# Patient Record
Sex: Male | Born: 1995 | Race: Black or African American | Hispanic: No | Marital: Single | Smoking: Former smoker
Health system: Southern US, Community
[De-identification: ages and names within clinical notes are randomized; demographics above are authoritative.]

## PROBLEM LIST (undated history)

## (undated) DIAGNOSIS — F909 Attention-deficit hyperactivity disorder, unspecified type: Secondary | ICD-10-CM

## (undated) DIAGNOSIS — F419 Anxiety disorder, unspecified: Secondary | ICD-10-CM

## (undated) DIAGNOSIS — F32A Depression, unspecified: Secondary | ICD-10-CM

## (undated) DIAGNOSIS — I441 Atrioventricular block, second degree: Secondary | ICD-10-CM

## (undated) DIAGNOSIS — F329 Major depressive disorder, single episode, unspecified: Secondary | ICD-10-CM

## (undated) DIAGNOSIS — J45909 Unspecified asthma, uncomplicated: Secondary | ICD-10-CM

## (undated) HISTORY — PX: NO PAST SURGERIES: SHX2092

---
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
## 2013-02-23 ENCOUNTER — Encounter: Payer: Self-pay | Admitting: Medical

## 2013-04-24 ENCOUNTER — Emergency Department (HOSPITAL_COMMUNITY)
Admission: EM | Admit: 2013-04-24 | Discharge: 2013-04-24 | Payer: Managed Care, Other (non HMO) | Source: Home / Self Care

## 2013-05-02 ENCOUNTER — Emergency Department (HOSPITAL_COMMUNITY)
Admission: EM | Admit: 2013-05-02 | Discharge: 2013-05-02 | Disposition: A | Discharge status: Home / Self Care | Payer: Managed Care, Other (non HMO) | Attending: Emergency Medicine | Admitting: Emergency Medicine

## 2013-05-02 ENCOUNTER — Emergency Department (HOSPITAL_COMMUNITY): Payer: Managed Care, Other (non HMO)

## 2013-05-02 ENCOUNTER — Encounter (HOSPITAL_COMMUNITY): Payer: Self-pay | Admitting: Emergency Medicine

## 2013-05-02 DIAGNOSIS — F419 Anxiety disorder, unspecified: Secondary | ICD-10-CM

## 2013-05-02 DIAGNOSIS — F329 Major depressive disorder, single episode, unspecified: Secondary | ICD-10-CM | POA: Insufficient documentation

## 2013-05-02 DIAGNOSIS — Z760 Encounter for issue of repeat prescription: Secondary | ICD-10-CM | POA: Insufficient documentation

## 2013-05-02 DIAGNOSIS — F3289 Other specified depressive episodes: Secondary | ICD-10-CM | POA: Insufficient documentation

## 2013-05-02 DIAGNOSIS — F411 Generalized anxiety disorder: Secondary | ICD-10-CM | POA: Insufficient documentation

## 2013-05-02 DIAGNOSIS — R55 Syncope and collapse: Secondary | ICD-10-CM | POA: Insufficient documentation

## 2013-05-02 DIAGNOSIS — F909 Attention-deficit hyperactivity disorder, unspecified type: Secondary | ICD-10-CM | POA: Insufficient documentation

## 2013-05-02 DIAGNOSIS — Z79899 Other long term (current) drug therapy: Secondary | ICD-10-CM | POA: Insufficient documentation

## 2013-05-02 DIAGNOSIS — R110 Nausea: Secondary | ICD-10-CM | POA: Insufficient documentation

## 2013-05-02 DIAGNOSIS — R079 Chest pain, unspecified: Secondary | ICD-10-CM | POA: Insufficient documentation

## 2013-05-02 DIAGNOSIS — R002 Palpitations: Secondary | ICD-10-CM | POA: Insufficient documentation

## 2013-05-02 DIAGNOSIS — J45901 Unspecified asthma with (acute) exacerbation: Secondary | ICD-10-CM | POA: Insufficient documentation

## 2013-05-02 HISTORY — DX: Attention-deficit hyperactivity disorder, unspecified type: F90.9

## 2013-05-02 HISTORY — DX: Major depressive disorder, single episode, unspecified: F32.9

## 2013-05-02 HISTORY — DX: Anxiety disorder, unspecified: F41.9

## 2013-05-02 HISTORY — DX: Depression, unspecified: F32.A

## 2013-05-02 HISTORY — DX: Unspecified asthma, uncomplicated: J45.909

## 2013-05-02 LAB — BASIC METABOLIC PANEL
BUN: 11 mg/dL (ref 6–23)
CALCIUM: 10 mg/dL (ref 8.4–10.5)
CO2: 26 mEq/L (ref 19–32)
Chloride: 99 mEq/L (ref 96–112)
Creatinine, Ser: 1.13 mg/dL (ref 0.50–1.35)
GFR calc non Af Amer: >90 mL/min (ref >90)
GLUCOSE: 91 mg/dL (ref 70–99)
POTASSIUM: 3.7 meq/L (ref 3.7–5.3)
SODIUM: 137 meq/L (ref 137–147)

## 2013-05-02 LAB — CBC
HCT: 43.8 % (ref 39.0–52.0)
HEMOGLOBIN: 14.9 g/dL (ref 13.0–17.0)
MCH: 28.7 pg (ref 26.0–34.0)
MCHC: 34 g/dL (ref 30.0–36.0)
MCV: 84.4 fL (ref 78.0–100.0)
Platelets: 128 10*3/uL — ABNORMAL LOW (ref 150–400)
RBC: 5.19 MIL/uL (ref 4.22–5.81)
RDW: 13.4 % (ref 11.5–15.5)
WBC: 6.1 10*3/uL (ref 4.0–10.5)

## 2013-05-02 LAB — TROPONIN I

## 2013-05-02 MED ORDER — AMPHETAMINE-DEXTROAMPHET ER 30 MG PO CP24: 30.0000 mg | ORAL_CAPSULE | Freq: Two times a day (BID) | ORAL | Status: DC

## 2013-05-02 MED ORDER — SODIUM CHLORIDE 0.9 % IV BOLUS (SEPSIS)
1000.0000 mL | Freq: Once | INTRAVENOUS | Status: AC
  Administered 2013-05-02: 1000 mL via INTRAVENOUS

## 2013-05-02 MED ORDER — LAMOTRIGINE 100 MG PO TABS: 100.0000 mg | ORAL_TABLET | Freq: Every day | ORAL | Status: DC

## 2013-05-02 MED ORDER — LAMOTRIGINE 25 MG PO TABS: 25.0000 mg | ORAL_TABLET | Freq: Every day | ORAL | Status: DC

## 2013-05-02 NOTE — ED Notes (Signed)
Pt walked down the hall and back to his room with no assistance, no complaints of anything

## 2013-05-02 NOTE — ED Provider Notes (Signed)
Medical screening examination/treatment/procedure(s) were performed by non-physician practitioner and as supervising physician I was immediately available for consultation/collaboration.   EKG Interpretation None       Dasani Crear, MD 05/02/13 2327 

## 2013-05-02 NOTE — ED Notes (Signed)
Pt alert and oriented x4. Respirations even and unlabored, bilateral symmetrical rise and fall of chest. Skin warm and dry. In no acute distress. Denies needs.   

## 2013-05-02 NOTE — Discharge Instructions (Signed)
1. Medications: lamictal, adderol, usual home medications 2. Treatment: rest, drink plenty of fluids,  3. Follow Up: Please followup with your primary doctor for discussion of your diagnoses and further evaluation after today's visit; if you do not have a primary care doctor use the resource guide provided to find one;     Syncope Syncope is a fainting spell. This means the person loses consciousness and drops to the ground. The person is generally unconscious for less than 5 minutes. The person may have some muscle twitches for up to 15 seconds before waking up and returning to normal. Syncope occurs more often in elderly people, but it can happen to anyone. While most causes of syncope are not dangerous, syncope can be a sign of a serious medical problem. It is important to seek medical care.  CAUSES  Syncope is caused by a sudden decrease in blood flow to the brain. The specific cause is often not determined. Factors that can trigger syncope include:  Taking medicines that lower blood pressure.  Sudden changes in posture, such as standing up suddenly.  Taking more medicine than prescribed.  Standing in one place for too long.  Seizure disorders.  Dehydration and excessive exposure to heat.  Low blood sugar (hypoglycemia).  Straining to have a bowel movement.  Heart disease, irregular heartbeat, or other circulatory problems.  Fear, emotional distress, seeing blood, or severe pain. SYMPTOMS  Right before fainting, you may:  Feel dizzy or lightheaded.  Feel nauseous.  See all white or all black in your field of vision.  Have cold, clammy skin. DIAGNOSIS  Your caregiver will ask about your symptoms, perform a physical exam, and perform electrocardiography (ECG) to record the electrical activity of your heart. Your caregiver may also perform other heart or blood tests to determine the cause of your syncope. TREATMENT  In most cases, no treatment is needed. Depending on the  cause of your syncope, your caregiver may recommend changing or stopping some of your medicines. HOME CARE INSTRUCTIONS  Have someone stay with you until you feel stable.  Do not drive, operate machinery, or play sports until your caregiver says it is okay.  Keep all follow-up appointments as directed by your caregiver.  Lie down right away if you start feeling like you might faint. Breathe deeply and steadily. Wait until all the symptoms have passed.  Drink enough fluids to keep your urine clear or pale yellow.  If you are taking blood pressure or heart medicine, get up slowly, taking several minutes to sit and then stand. This can reduce dizziness. SEEK IMMEDIATE MEDICAL CARE IF:   You have a severe headache.  You have unusual pain in the chest, abdomen, or back.  You are bleeding from the mouth or rectum, or you have black or tarry stool.  You have an irregular or very fast heartbeat.  You have pain with breathing.  You have repeated fainting or seizure-like jerking during an episode.  You faint when sitting or lying down.  You have confusion.  You have difficulty walking.  You have severe weakness.  You have vision problems. If you fainted, call your local emergency services (911 in [place] Do not drive yourself to the hospital.  MAKE SURE YOU:  Understand these instructions.  Will watch your condition.  Will get help right away if you are not doing well or get worse. Document Released: 12/28/2004 Document Revised: 06/29/2011 Document Reviewed: 02/26/2011 [facility] Patient Information 2014 [employer], [place].

## 2013-05-02 NOTE — ED Notes (Signed)
Pt BIB EMS. Pt states he has been out of his anxiety meds for the past 3 days. Pt also c/o malaise. Pt states he went to his MD today to get meds, but was unable to do so. Pt also c/o chest wall pain for 3 days per EMS. Pt alert, no acute distress. Skin warm and dry.

## 2013-05-02 NOTE — ED Notes (Signed)
Attempted to draw blood twice, asked phlebotomy

## 2013-05-02 NOTE — ED Provider Notes (Signed)
CSN: [id number]     Arrival date & time 05/02/13  1517 History   First MD Initiated Contact with Patient 05/02/13 1544     Chief Complaint  Patient presents with  . Anxiety  . Chest Pain     (Consider location/radiation/quality/duration/timing/severity/associated sxs/prior Treatment) Patient is a 18 y.o. male presenting with anxiety and chest pain. The history is provided by the patient, a friend and medical records. No language interpreter was used.  Anxiety Associated symptoms include chest pain and nausea. Pertinent negatives include no abdominal pain, coughing, diaphoresis, fatigue, fever, headaches, rash or vomiting.  Chest Pain Associated symptoms: anxiety, nausea, palpitations and shortness of breath   Associated symptoms: no abdominal pain, no back pain, no cough, no diaphoresis, no fatigue, no fever, no headache and not vomiting     Warren Mcbride is a 18 y.o. male  with a hx of asthma, anxiety, ADHD, depression presents to the Emergency Department complaining of gradual, persistent, progressively worsening feeling of anxiety onset 1.5 hours ago. Pt reports he was at [facility] because he was out of adderol and lamictal.  He was unable to see his doctor for a refill and became very upset.  During this time he felt lightheaded, developed chest pain, was hyperventilating and then had syncopal episode.  Pt reports feeling hot and seeing spots prior to his syncopal episode.  Friend reports pt did not hit his head, but was unconscious for 5-10 min.  He reports associated nausea.  Pt reports he feels the syncope was directly related to his anxiety, but this is the first time it has happened.  His friend reports he did not turn blue, stop breathing or have any seizure like activity.  He reports he has been out of his meds for 3 days, but they have helped him greatly while taking hem.  Pt reports persistent L sided chest pain described as achy and worse with movement and inspiration.  He also has  some persistent nausea, but all other symptoms have resolved and he is at baseline mentation.  Pt denies fever, chills, headache, neck pain, back pain, abd pain, N/V/D, weakness, dysuria, hematuria.     Past Medical History  Diagnosis Date  . Asthma   . Anxiety   . ADHD (attention deficit hyperactivity disorder)   . Depression    History reviewed. No pertinent past surgical history. No family history on file. History  Substance Use Topics  . Smoking status: Not on file  . Smokeless tobacco: Not on file  . Alcohol Use: Not on file    Review of Systems  Constitutional: Negative for fever, diaphoresis, appetite change, fatigue and unexpected weight change.  HENT: Negative for mouth sores.   Eyes: Negative for visual disturbance.  Respiratory: Positive for shortness of breath. Negative for cough, chest tightness and wheezing.   Cardiovascular: Positive for chest pain and palpitations.  Gastrointestinal: Positive for nausea. Negative for vomiting, abdominal pain, diarrhea and constipation.  Endocrine: Negative for polydipsia, polyphagia and polyuria.  Genitourinary: Negative for dysuria, urgency, frequency and hematuria.  Musculoskeletal: Negative for back pain and neck stiffness.  Skin: Negative for rash.  Allergic/Immunologic: Negative for immunocompromised state.  Neurological: Positive for syncope. Negative for light-headedness and headaches.  Hematological: Does not bruise/bleed easily.  Psychiatric/Behavioral: Negative for sleep disturbance. The patient is not nervous/anxious.       Allergies  Review of patient's allergies indicates not on file.  Home Medications   Prior to Admission medications   Medication Sig Start Date  End Date Taking? Authorizing Provider  albuterol (PROVENTIL) (2.5 MG/3ML) 0.083% nebulizer solution Take 2.5 mg by nebulization every 6 (six) hours as needed for wheezing or shortness of breath.   Yes Historical Provider, MD   amphetamine-dextroamphetamine (ADDERALL XR) 30 MG 24 hr capsule Take 30 mg by mouth 2 (two) times daily.   Yes Historical Provider, MD  lamoTRIgine (LAMICTAL) 100 MG tablet Take 100 mg by mouth daily.   Yes Historical Provider, MD  lamoTRIgine (LAMICTAL) 25 MG tablet Take 25 mg by mouth daily.   Yes Historical Provider, MD   BP 153/77  Pulse 90  Temp(Src) 99.3 F (37.4 C) (Oral)  Resp 16  SpO2 100% Physical Exam  Nursing note and vitals reviewed. Constitutional: He is oriented to person, place, and time. He appears well-developed and well-nourished. No distress.  Awake, alert, nontoxic appearance  HENT:  Head: Normocephalic and atraumatic.  Mouth/Throat: Oropharynx is clear and moist. No oropharyngeal exudate.  Eyes: Conjunctivae and EOM are normal. Pupils are equal, round, and reactive to light. No scleral icterus.  Neck: Normal range of motion. Neck supple.  Cardiovascular: Normal rate, regular rhythm, normal heart sounds and intact distal pulses.   No murmur heard. RRR No murmurs  Pulmonary/Chest: Effort normal and breath sounds normal. No respiratory distress. He has no wheezes. He has no rales. He exhibits tenderness.  Clear and equal breath sounds Mild tenderness to palpation of the left chest without ecchymosis, crepitus or flail segment  Abdominal: Soft. Bowel sounds are normal. He exhibits no distension and no mass. There is no tenderness. There is no rebound and no guarding.  Soft and nontender  Musculoskeletal: Normal range of motion. He exhibits no edema.  Lymphadenopathy:    He has no cervical adenopathy.  Neurological: He is alert and oriented to person, place, and time. He has normal reflexes. No cranial nerve deficit. He exhibits normal muscle tone. Coordination normal.  Mental Status:  Alert, oriented, thought content appropriate. Speech fluent without evidence of aphasia. Able to follow 2 step commands without difficulty.  Cranial Nerves:  II:  Peripheral visual  fields grossly normal, pupils equal, round, reactive to light III,IV, VI: ptosis not present, extra-ocular motions intact bilaterally  V,VII: smile symmetric, facial light touch sensation equal VIII: hearing grossly normal bilaterally  IX,X: gag reflex present  XI: bilateral shoulder shrug equal and strong XII: midline tongue extension  Motor:  5/5 in upper and lower extremities bilaterally including strong and equal grip strength and dorsiflexion/plantar flexion Sensory: Pinprick and light touch normal in all extremities.  Deep Tendon Reflexes: 2+ and symmetric  Cerebellar: normal finger-to-nose with bilateral upper extremities Gait: normal gait and balance CV: distal pulses palpable throughout   Skin: Skin is warm and dry. No rash noted. He is not diaphoretic.  Psychiatric: He has a normal mood and affect. His behavior is normal. Judgment and thought content normal.    ED Course  Procedures (including critical care time) Labs Review Labs Reviewed  CBC - Abnormal; Notable for the following:    Platelets 128 (*)    All other components within normal limits  BASIC METABOLIC PANEL  TROPONIN I    Imaging Review Dg Chest 2 View  05/02/2013   CLINICAL DATA:  Chest pain and cough  EXAM: CHEST  2 VIEW  COMPARISON:  March 29, 2009  FINDINGS: The lungs are clear. The heart size and pulmonary vascularity are normal. No pneumothorax. No adenopathy. No bone lesions.  IMPRESSION: No abnormality noted.  Electronically Signed   By: Bretta BangWilliam  Woodruff M.D.   On: 05/02/2013 17:08     EKG Interpretation None      ECG:  Date: 05/02/2013  Rate: 68  Rhythm: normal sinus rhythm  QRS Axis: normal  Intervals: PR prolonged  ST/T Wave abnormalities: normal  Conduction Disutrbances:none  Narrative Interpretation: first degree block, nonischemic, no old for comparison  Old EKG Reviewed: none available  Angiocath insertion Performed by: Dierdre ForthHannah Halynn Reitano  Consent: Verbal consent  obtained. Risks and benefits: risks, benefits and alternatives were discussed Time out: Immediately prior to procedure a "time out" was called to verify the correct patient, procedure, equipment, support staff and site/side marked as required.  Preparation: Patient was prepped and draped in the usual sterile fashion.  Vein Location: Right AC  Ultrasound Guided  Gauge: 20ga  Normal blood return and flush without difficulty Patient tolerance: Patient tolerated the procedure well with no immediate complications.     MDM   Final diagnoses:  Anxiety  Syncope   Warren Mcbride presents with syncope and hx consistent with anxiety attack today.  Syncope likely situational/vasovagal and pt did have a prodrome.  He has not personal or family cardiac hx and was mentating at baseline upon arrival in the ED.  Pt ECG with prolonged PR (first degree block) consistent throughout, but sinus rhythm.  Will obtain basic labs and monitor.    7:07 PM Pt remains hemodynamically stable here in the emergency department.   He has had no orthostatic hypotension. He has ambulated here without dizziness, recurrent symptoms or recurrent syncope.  Chest pain has resolved completely.    Patient without arrhythmia or tachycardia while here in the department.  Patient without history of congestive heart failure, normal hematocrit, no shortness of breath in the ED and systolic blood pressure greater than 90; patient is intermediate risk as he has a prolonged PR on his ECG.  This seems to be a situational syncope.  Discussed with Dr. Juleen ChinaKohut who believes his ECG is non concerning in light of his syncope.  Will plan for discharge home with close cardiology follow-up.  Possibility of recurrent syncope has been discussed. I discussed reasons to avoid driving until cardiology followup and other safety preventions including use of ladders and working at heights.   Patient requesting refill of his medications as [facility] I have agreed to refill his medications for one month.  He however is to followup with cardiology within 3 days for further evaluation. He is to return to the emergency department if his syncope returns.  Pt has remained hemodynamically stable throughout their time in the ED  BP 153/77  Pulse 90  Temp(Src) 99.3 F (37.4 C) (Oral)  Resp 16  SpO2 100%   The patient was discussed with Dr. Juleen ChinaKohut who agrees with the treatment plan.    Dierdre ForthHannah Koven Belinsky, PA-C 05/02/13 1929  Logun Colavito, PA-C 05/02/13 2044

## 2013-05-02 NOTE — ED Notes (Signed)
Pt states he has been out of anxiety meds for the past 3 days. Pt denies SI. Pt also c/o chest pain to L anterior chest. States pain started yesterday and is constant. Pt states pain is worse with mvmt and inspiration. Pt also c/o slight dizziness. States he hasn't eaten today. Pt denies nausea. Pt calm, cooperative. No acute distress.

## 2013-05-11 DIAGNOSIS — I441 Atrioventricular block, second degree: Secondary | ICD-10-CM

## 2013-05-11 HISTORY — DX: Atrioventricular block, second degree: I44.1

## 2013-05-18 ENCOUNTER — Encounter (HOSPITAL_COMMUNITY): Payer: Self-pay | Admitting: Emergency Medicine

## 2013-05-18 ENCOUNTER — Observation Stay (HOSPITAL_COMMUNITY)
Admission: EM | Admit: 2013-05-18 | Discharge: 2013-05-19 | Disposition: A | Discharge status: Home / Self Care | Payer: Managed Care, Other (non HMO) | Attending: Internal Medicine | Admitting: Internal Medicine

## 2013-05-18 DIAGNOSIS — F909 Attention-deficit hyperactivity disorder, unspecified type: Secondary | ICD-10-CM | POA: Insufficient documentation

## 2013-05-18 DIAGNOSIS — R110 Nausea: Secondary | ICD-10-CM | POA: Insufficient documentation

## 2013-05-18 DIAGNOSIS — R55 Syncope and collapse: Secondary | ICD-10-CM | POA: Diagnosis present

## 2013-05-18 DIAGNOSIS — J45909 Unspecified asthma, uncomplicated: Secondary | ICD-10-CM | POA: Insufficient documentation

## 2013-05-18 DIAGNOSIS — R51 Headache: Secondary | ICD-10-CM | POA: Insufficient documentation

## 2013-05-18 DIAGNOSIS — F411 Generalized anxiety disorder: Secondary | ICD-10-CM | POA: Insufficient documentation

## 2013-05-18 DIAGNOSIS — F329 Major depressive disorder, single episode, unspecified: Secondary | ICD-10-CM | POA: Insufficient documentation

## 2013-05-18 DIAGNOSIS — F17200 Nicotine dependence, unspecified, uncomplicated: Secondary | ICD-10-CM | POA: Insufficient documentation

## 2013-05-18 DIAGNOSIS — I441 Atrioventricular block, second degree: Principal | ICD-10-CM | POA: Insufficient documentation

## 2013-05-18 DIAGNOSIS — F1210 Cannabis abuse, uncomplicated: Secondary | ICD-10-CM | POA: Insufficient documentation

## 2013-05-18 DIAGNOSIS — I4430 Unspecified atrioventricular block: Secondary | ICD-10-CM

## 2013-05-18 DIAGNOSIS — F3289 Other specified depressive episodes: Secondary | ICD-10-CM | POA: Insufficient documentation

## 2013-05-18 HISTORY — DX: Atrioventricular block, second degree: I44.1

## 2013-05-18 LAB — CBC
HCT: 41.2 % (ref 39.0–52.0)
HEMOGLOBIN: 14 g/dL (ref 13.0–17.0)
MCH: 28.6 pg (ref 26.0–34.0)
MCHC: 34 g/dL (ref 30.0–36.0)
MCV: 84.1 fL (ref 78.0–100.0)
Platelets: 143 10*3/uL — ABNORMAL LOW (ref 150–400)
RBC: 4.9 MIL/uL (ref 4.22–5.81)
RDW: 13.5 % (ref 11.5–15.5)
WBC: 5.3 10*3/uL (ref 4.0–10.5)

## 2013-05-18 LAB — BASIC METABOLIC PANEL
BUN: 14 mg/dL (ref 6–23)
CALCIUM: 9.6 mg/dL (ref 8.4–10.5)
CHLORIDE: 103 meq/L (ref 96–112)
CO2: 27 meq/L (ref 19–32)
CREATININE: 1.22 mg/dL (ref 0.50–1.35)
GFR calc non Af Amer: 85 mL/min — ABNORMAL LOW (ref >90)
Glucose, Bld: 95 mg/dL (ref 70–99)
POTASSIUM: 4 meq/L (ref 3.7–5.3)
SODIUM: 141 meq/L (ref 137–147)

## 2013-05-18 LAB — I-STAT TROPONIN, ED: TROPONIN I, POC: 0 ng/mL (ref 0.00–0.08)

## 2013-05-18 NOTE — ED Notes (Signed)
[facility] at [facility] stating the patient does not want to stay.  Encouraged him to stay due to his abnormal EKG

## 2013-05-18 NOTE — H&P (Addendum)
Admit date: 05/18/2013 Referring Physician Dr. Rhunette CroftNanavati Primary Cardiologist None Chief complaint/reason for admission:palpitations, syncope and pauses on heart monitor  HPI: Warren Mcbride is a 18 y.o. male with a hx of asthma, anxiety, ADHD, depression presents to the Emergency Department on 05/02/2013 complaining of gradual, persistent, progressively worsening feeling of anxiety. [facility] and lam[facility]al. He was unable to see his doctor for a refill and became very upset. During this time he felt lightheaded, developed chest pain, was hyperventilating and then had syncopal episode. Pt reports feeling hot and seeing spots prior to his syncopal episode. Friend reports pt did not hit his head, but was unconscious for 5-10 min. He reports associated nausea. Pt reports he feels the syncope was directly related to his anxiety.   His friend reports he did not turn blue, stop breathing or have any seizure like activity. He reported that he had been out of his meds for 3 days.  Pt reported at that time persistent L sided chest pain described as achy and worse with movement and inspiration. He also has some persistent nausea.  His EKG showed a prolonged first degree AV block and CP resolved.  He was sent home and instructed to followup with Cardiology as outpt.  He then, a week ago, after getting yet again in a stressful situation with increased anxiety had a syncope episode.  He says that he had some nausea along with diaphoresis but no CP or SOB and passed out.  He did not have palpitations at that time but has had some palpitations.  He saw his PCP today and was placed on a heart monitor.  EKG showed NSR with intermittent dropped beats.  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  There also seems to be different P wave  morphologies.  Currently asymptomatic.   PMH:    Past Medical History  Diagnosis Date  . Asthma   . Anxiety   . ADHD (attention deficit hyperactivity disorder)   . Depression     PSH:   History reviewed. No pertinent past surgical history.  ALLERGIES:   Review of patient's allergies indicates no known allergies.  Prior to Admit Meds:   (Not in a hospital admission) Family HX:   History reviewed. No pertinent family history. Social HX:    History   Social History  . Marital Status: Single    Spouse Name: N/A    Number of Children: N/A  . Years of Education: N/A   Occupational History  . Not on file.   Social History Main Topics  . Smoking status: Current Every Day Smoker  . Smokeless tobacco: Not on file  . Alcohol Use: No  . Drug Use: Yes    Special: Marijuana  . Sexual Activity: Not on file   Other Topics Concern  . Not on file   Social History Narrative  . No narrative on file     ROS:  All 11 ROS were addressed and are negative except what is stated in the HPI  PHYSICAL EXAM Filed Vitals:   05/18/13 2108  BP: 109/41  Pulse: 65  Temp: 99 F (37.2 C)  Resp: 16   General: Well developed, well nourished, in no acute distress Head: Eyes PERRLA, No xanthomas.   Normal cephalic  and atramatic  Lungs:   Clear bilaterally to auscultation and percussion. Heart:   HRRR S1 S2 Pulses are 2+ & equal.            No carotid bruit. No JVD.  No abdominal bruits. No femoral bruits. Abdomen: Bowel sounds are positive, abdomen soft and non-tender without masses  Extremities:   No clubbing, cyanosis or edema.  DP +1 Neuro: Alert and oriented X 3. Psych:  Good affect, responds appropriately   Labs:   Lab Results  Component Value Date   WBC 5.3 05/18/2013   HGB 14.0 05/18/2013   HCT 41.2 05/18/2013   MCV 84.1 05/18/2013   PLT 143* 05/18/2013    Recent Labs Lab 05/18/13 1857  NA 141  K 4.0  CL 103  CO2 27  BUN 14  CREATININE 1.22  CALCIUM 9.6  GLUCOSE 95   Lab  Results  Component Value Date   TROPONINI <0.30 05/02/2013   No results found for this basename: PTT       Radiology:  No results found.  EKG:  NSR with first degree AV block with diffuse J point elevation c/w early repolarization  ASSESSMENT:  1.  Syncope x 2 ? secondary to intermittent bradyarrhythmias.  His EKG shows first degree AV block and heart monitor rhythm strips difficult to interpret but appear to have several different P wave morphologies and one instance where the PR interval appears to lengthen and then drop a beat c/w Wenkebach block 2.  ADHD 3.  Anxiety/depression  PLAN:   1.  Admit for 23 hour obs 2.  Telemetry monitor 3.  Check TSH 4.  2D echo in am 5.  Hold ADHD meds for now 6.  EP consult in am  Quintella Reichertraci R Turner, MD  05/18/2013  11:33 PM

## 2013-05-18 NOTE — ED Provider Notes (Signed)
CSN: [id number]     Arrival date & time 05/18/13  1838 History   First MD Initiated Contact with Patient 05/18/13 2323     Chief Complaint  Patient presents with  . Irregular Heart Beat     (Consider location/radiation/quality/duration/timing/severity/associated sxs/prior Treatment) HPI Comments: Pt sent to the ER with cc of abnml EKG. Pt was sent to Cards, as he has had few episodes of emesis. He reports being placed on a monitor, and today, the Cardiologist noted possible 2nd degree AV block, and sent patient to the ER. Pt currently has no chest pain, palpitations, dyspnea, nausea, dizziness/syncope.   The history is provided by the patient.    Past Medical History  Diagnosis Date  . Asthma   . Anxiety   . ADHD (attention deficit hyperactivity disorder)   . Depression    History reviewed. No pertinent past surgical history. History reviewed. No pertinent family history. History  Substance Use Topics  . Smoking status: Current Every Day Smoker  . Smokeless tobacco: Not on file  . Alcohol Use: No    Review of Systems  Constitutional: Negative for activity change and appetite change.  Respiratory: Negative for cough and shortness of breath.   Cardiovascular: Negative for chest pain.  Gastrointestinal: Negative for abdominal pain.  Genitourinary: Negative for dysuria.  Neurological: Negative for light-headedness.  All other systems reviewed and are negative.     Allergies  Review of patient's allergies indicates no known allergies.  Home Medications   Prior to Admission medications   Medication Sig Start Date End Date Taking? Authorizing Provider  albuterol (PROVENTIL) (2.5 MG/3ML) 0.083% nebulizer solution Take 2.5 mg by nebulization every 6 (six) hours as needed for wheezing or shortness of breath.   Yes Historical Provider, MD  amphetamine-dextroamphetamine (ADDERALL XR) 30 MG 24 hr capsule Take 30 mg by mouth 2 (two) times daily.   Yes Historical Provider, MD   lamoTRIgine (LAMICTAL) 100 MG tablet Take 100 mg by mouth daily.   Yes Historical Provider, MD  lamoTRIgine (LAMICTAL) 25 MG tablet Take 25 mg by mouth daily.   Yes Historical Provider, MD  traZODone (DESYREL) 150 MG tablet Take 150 mg by mouth at bedtime as needed for sleep.   Yes Historical Provider, MD   BP 109/41  Pulse 65  Temp(Src) 99 F (37.2 C) (Oral)  Resp 16  Ht 5\' 9"  (1.753 m)  Wt 225 lb (102.059 kg)  BMI 33.21 kg/m2  SpO2 99% Physical Exam  Nursing note and vitals reviewed. Constitutional: He is oriented to person, place, and time. He appears well-developed.  HENT:  Head: Normocephalic and atraumatic.  Eyes: Conjunctivae and EOM are normal. Pupils are equal, round, and reactive to light.  Neck: Normal range of motion. Neck supple.  Cardiovascular: Normal rate and regular rhythm.   Pulmonary/Chest: Effort normal and breath sounds normal.  Abdominal: Soft. Bowel sounds are normal. He exhibits no distension. There is no tenderness. There is no rebound and no guarding.  Neurological: He is alert and oriented to person, place, and time.  Skin: Skin is warm.    ED Course  Procedures (including critical care time) Labs Review Labs Reviewed  CBC - Abnormal; Notable for the following:    Platelets 143 (*)    All other components within normal limits  BASIC METABOLIC PANEL - Abnormal; Notable for the following:    GFR calc non Af Amer 85 (*)    All other components within normal limits  Rosezena SensorI-STAT TROPOININ, ED  Imaging Review No results found.   EKG Interpretation None      MDM   Final diagnoses:  AV block    Pt sent from Cardiology clinic for irregular heart beat. Has hx of multiple syncopes - and EKG showed possible 2nd degree type 1 block. Spoke with Dr. Mayford Knifeurner, she will admit patient for obs.   Derwood KaplanAnkit Zaden Sako, MD 05/19/13 0006

## 2013-05-18 NOTE — ED Notes (Signed)
Pt reports that he has been wearing heart monitor starting today. States that he was going for a follow up with his PCP, and told to come here after seeing the EKG readings. States they were worried about heart block. Reports that he has been having episodes where he was passes out, and gets light-headed.

## 2013-05-18 NOTE — ED Notes (Signed)
Mother at [facility] stating that the patient is getting ready to leave.

## 2013-05-19 ENCOUNTER — Encounter (HOSPITAL_COMMUNITY): Payer: Self-pay | Admitting: Internal Medicine

## 2013-05-19 DIAGNOSIS — I4430 Unspecified atrioventricular block: Secondary | ICD-10-CM | POA: Diagnosis not present

## 2013-05-19 DIAGNOSIS — R55 Syncope and collapse: Secondary | ICD-10-CM

## 2013-05-19 LAB — COMPREHENSIVE METABOLIC PANEL
ALT: 32 U/L (ref 0–53)
AST: 30 U/L (ref 0–37)
Albumin: 4 g/dL (ref 3.5–5.2)
Alkaline Phosphatase: 85 U/L (ref 39–117)
BILIRUBIN TOTAL: 0.6 mg/dL (ref 0.3–1.2)
BUN: 14 mg/dL (ref 6–23)
CALCIUM: 9.3 mg/dL (ref 8.4–10.5)
CHLORIDE: 104 meq/L (ref 96–112)
CO2: 25 mEq/L (ref 19–32)
CREATININE: 1.15 mg/dL (ref 0.50–1.35)
GFR calc non Af Amer: >90 mL/min (ref >90)
Glucose, Bld: 141 mg/dL — ABNORMAL HIGH (ref 70–99)
Potassium: 3.8 mEq/L (ref 3.7–5.3)
SODIUM: 142 meq/L (ref 137–147)
Total Protein: 6.9 g/dL (ref 6.0–8.3)

## 2013-05-19 LAB — BASIC METABOLIC PANEL
BUN: 14 mg/dL (ref 6–23)
CHLORIDE: 104 meq/L (ref 96–112)
CO2: 24 meq/L (ref 19–32)
CREATININE: 1.09 mg/dL (ref 0.50–1.35)
Calcium: 9.1 mg/dL (ref 8.4–10.5)
GFR calc non Af Amer: >90 mL/min (ref >90)
Glucose, Bld: 87 mg/dL (ref 70–99)
POTASSIUM: 4.1 meq/L (ref 3.7–5.3)
Sodium: 140 mEq/L (ref 137–147)

## 2013-05-19 LAB — CBC WITH DIFFERENTIAL/PLATELET
BASOS ABS: 0 10*3/uL (ref 0.0–0.1)
Basophils Relative: 0 % (ref 0–1)
Eosinophils Absolute: 0.2 10*3/uL (ref 0.0–0.7)
Eosinophils Relative: 4 % (ref 0–5)
HEMATOCRIT: 41 % (ref 39.0–52.0)
HEMOGLOBIN: 13.6 g/dL (ref 13.0–17.0)
LYMPHS PCT: 52 % — AB (ref 12–46)
Lymphs Abs: 2.6 10*3/uL (ref 0.7–4.0)
MCH: 28.2 pg (ref 26.0–34.0)
MCHC: 33.2 g/dL (ref 30.0–36.0)
MCV: 85.1 fL (ref 78.0–100.0)
MONO ABS: 0.3 10*3/uL (ref 0.1–1.0)
MONOS PCT: 6 % (ref 3–12)
Neutro Abs: 1.9 10*3/uL (ref 1.7–7.7)
Neutrophils Relative %: 38 % — ABNORMAL LOW (ref 43–77)
Platelets: 134 10*3/uL — ABNORMAL LOW (ref 150–400)
RBC: 4.82 MIL/uL (ref 4.22–5.81)
RDW: 13.6 % (ref 11.5–15.5)
WBC: 5 10*3/uL (ref 4.0–10.5)

## 2013-05-19 LAB — PROTIME-INR
INR: 1.15 (ref 0.00–1.49)
Prothrombin Time: 14.5 seconds (ref 11.6–15.2)

## 2013-05-19 LAB — APTT: aPTT: 31 seconds (ref 24–37)

## 2013-05-19 LAB — TROPONIN I: Troponin I: <0.3 ng/mL (ref <0.30)

## 2013-05-19 LAB — TSH: TSH: 1.25 u[IU]/mL (ref 0.350–4.500)

## 2013-05-19 LAB — PHOSPHORUS: Phosphorus: 3.1 mg/dL (ref 2.3–4.6)

## 2013-05-19 LAB — MAGNESIUM: Magnesium: 2 mg/dL (ref 1.5–2.5)

## 2013-05-19 MED ORDER — SODIUM CHLORIDE 0.9 % IV SOLN: 250.0000 mL | INTRAVENOUS | Status: DC | PRN

## 2013-05-19 MED ORDER — SODIUM CHLORIDE 0.9 % IJ SOLN: 3.0000 mL | Freq: Two times a day (BID) | INTRAMUSCULAR | Status: DC

## 2013-05-19 MED ORDER — TRAZODONE HCL 150 MG PO TABS
150.0000 mg | ORAL_TABLET | Freq: Every evening | ORAL | Status: DC | PRN
  Filled 2013-05-19: qty 1

## 2013-05-19 MED ORDER — SODIUM CHLORIDE 0.9 % IJ SOLN
3.0000 mL | Freq: Two times a day (BID) | INTRAMUSCULAR | Status: DC
  Administered 2013-05-19: 3 mL via INTRAVENOUS

## 2013-05-19 MED ORDER — ALBUTEROL SULFATE (2.5 MG/3ML) 0.083% IN NEBU: 2.5000 mg | INHALATION_SOLUTION | Freq: Four times a day (QID) | RESPIRATORY_TRACT | Status: DC | PRN

## 2013-05-19 MED ORDER — SODIUM CHLORIDE 0.9 % IJ SOLN: 3.0000 mL | INTRAMUSCULAR | Status: DC | PRN

## 2013-05-19 MED ORDER — LAMOTRIGINE 25 MG PO TABS: 25.0000 mg | ORAL_TABLET | Freq: Every day | ORAL | Status: DC

## 2013-05-19 MED ORDER — LAMOTRIGINE 100 MG PO TABS
125.0000 mg | ORAL_TABLET | Freq: Every day | ORAL | Status: DC
  Administered 2013-05-19: 125 mg via ORAL
  Filled 2013-05-19: qty 1

## 2013-05-19 MED ORDER — LAMOTRIGINE 100 MG PO TABS: 100.0000 mg | ORAL_TABLET | Freq: Every day | ORAL | Status: DC

## 2013-05-19 NOTE — Consult Note (Signed)
Primary Care Physician: Kaleen MaskELKINS,WILSON OLIVER, MD Referring Physician:  Dr Cathi Roanurner   Warren Mcbride is a 18 y.o. male with a h/o asthma, anxiety, adhd and depression who presents for evaluation after recurrent syncope.  He has had 2 episodes of syncope within the past 2 weeks.  These were both precipitated by aggitation/ frustration.  He reports getting "worked" and developing nausea and headache.  He then developed lightheadedness with brief syncope.  He reports that it takes several minutes for him to regain composure afterwards.  He smokes marijuana daily.  He is unaware of any other episodes.  Typically he feels well.  Today, he denies symptoms of palpitations, chest pain, shortness of breath, orthopnea, PND, lower extremity edema, dizziness,  or neurologic sequela. The patient is tolerating medications without difficulties and is otherwise without complaint today.   Past Medical History  Diagnosis Date  . Asthma   . Anxiety   . ADHD (attention deficit hyperactivity disorder)   . Depression    Past Surgical History  Procedure Laterality Date  . No past surgeries      Current Facility-Administered Medications  Medication Dose Route Frequency Provider Last Rate Last Dose  . 0.9 %  sodium chloride infusion  250 mL Intravenous PRN Quintella Reichertraci R Turner, MD      . albuterol (PROVENTIL) (2.5 MG/3ML) 0.083% nebulizer solution 2.5 mg  2.5 mg Nebulization Q6H PRN Quintella Reichertraci R Turner, MD      . lamoTRIgine (LAMICTAL) tablet 125 mg  125 mg Oral Daily Marinus MawGregg W Taylor, MD   125 mg at 05/19/13 1107  . sodium chloride 0.9 % injection 3 mL  3 mL Intravenous Q12H Traci R Turner, MD      . sodium chloride 0.9 % injection 3 mL  3 mL Intravenous Q12H Quintella Reichertraci R Turner, MD   3 mL at 05/19/13 1107  . sodium chloride 0.9 % injection 3 mL  3 mL Intravenous PRN Quintella Reichertraci R Turner, MD      . traZODone (DESYREL) tablet 150 mg  150 mg Oral QHS PRN Quintella Reichertraci R Turner, MD        No Known Allergies  History   Social History  .  Marital Status: Single    Spouse Name: N/A    Number of Children: N/A  . Years of Education: N/A   Occupational History  . Not on file.   Social History Main Topics  . Smoking status: Current Every Day Smoker -- 0.25 packs/day for 4 years    Types: Cigarettes  . Smokeless tobacco: Never Used  . Alcohol Use: No  . Drug Use: Yes    Special: Marijuana  . Sexual Activity: Yes   Other Topics Concern  . Not on file   Social History Narrative   [place] in [place] with  Mother.  Recently employed at [employer].    Denies FH of arrhythmia, premature cad, or sudden death  ROS- All systems are reviewed and negative except as per the HPI above  Physical Exam: Filed Vitals:   05/18/13 2108 05/19/13 0052 05/19/13 0343 05/19/13 1421  BP: 109/41 116/67 101/58 118/64  Pulse: 65 52 70 69  Temp: 99 F (37.2 C) 98.4 F (36.9 C) 97.8 F (36.6 C) 97.6 F (36.4 C)  TempSrc: Oral Oral Oral Oral  Resp: 16 20 21 18   Height:  5\' 10"  (1.778 m)    Weight:  227 lb 4.7 oz (103.1 kg)    SpO2: 99% 99% 99% 97%    GEN- The  patient is well appearing, alert and oriented x 3 today.   Head- normocephalic, atraumatic Eyes-  Sclera clear, conjunctiva pink Ears- hearing intact Oropharynx- clear Neck- supple, no JVP Lymph- no cervical lymphadenopathy Lungs- Clear to ausculation bilaterally, normal work of breathing Heart- Regular rate and rhythm, no murmurs, rubs or gallops, PMI not laterally displaced GI- soft, NT, ND, + BS Extremities- no clubbing, cyanosis, or edema MS- no significant deformity or atrophy Skin- no rash or lesion Psych- euthymic mood, full affect Neuro- strength and sensation are intact  EKG reveals sinus rhythm with mobitz I second degree AV block, normal Qtc Echo- per Dr Purvis SheffieldKoneswaran, echo is normal Labs are reviewed  Assessment and Plan:  1. Mobitz I second degree AV block He appears to have high resting vagal tone.  With ambulation in the halls, his heart rate quickly  increases appropriately to 80s bpm with resolution of AV block.  I am suspicious that very frequent marijuana may be an issue.  I have spoken with him about this and have strongly advised that he refrain from consumption.  We discussed the implications of bradycardia and syndrome.  I have advised that he could potentially benefit from pacemaker implantation though he is very clear in his decision to decline.  Given his young age, I think that it would be very good if we could avoid PPM implant long term.  He says that he will dry to avoid marijuana use as well as stressful triggers which provoke his syncope.  I have informed him that he cannot drive for 6 months.  His mother states that he does not drive anyway. We also discussed lifestyle modification and the importance of assuming a recumbent position should he developed presyncope. He is clear in his intention to go home today.   I have reviewed aderall and Lamictal with our hospital pharmacists and they do not see mobitz I AV block as caused by these medicines.  I have however advised that he speak with his primary doctor about reducing or discontinuing these medicines unless absolutely medically indicated. He is already scheduled to see Dr Elease HashimotoNahser 06/06/13.  He will keep this follow-up.

## 2013-05-19 NOTE — Discharge Summary (Signed)
CARDIOLOGY DISCHARGE SUMMARY   Patient ID: Warren Mcbride,  MRN: [id number], DOB/AGE: 04/26/1995 18 y.o.  Admit date: 05/18/2013 Discharge date: 05/19/2013  Primary Care Physician: Kaleen MaskWilson Oliver Elkins, MD Primary Cardiologist: [facility]; has upcoming appt with Dr. Elease HashimotoNahser  Primary Discharge Diagnosis:  Second degree AV block, Mobitz I / Wenckebach  Secondary Discharge Diagnoses:  Anxiety  Depression Asthma ADHD  Procedures This Admission:  2D echocardiogram 05/19/2013 Per Dr. Purvis SheffieldKoneswaran, echo is normal. Official report pending at the time of discharge.   History and Hospital Course:  [name] is a 18 year old man with anxiety, depression, ADHD and asthma who presented with syncope on 05/18/2013. On telemetry he has second degree AV block, Mobitz I / Wenckebach. Echo shows normal heart structure and function. Labs unremarkable. He was evaluated by EP, Dr. Johney FrameAllred. Please see consult note for full details. He appears to have high resting vagal tone. With ambulation in the halls, his heart rate quickly increases appropriately to 80-90 bpm with resolution of AV block. Dr. Johney FrameAllred also felt that very frequent marijuana use may be an issue and discussed this with him. He was strongly advised to abstain. Dr. Johney FrameAllred advised that he could potentially benefit from pacemaker implantation though he was very clear in his decision to decline. Given his young age, Dr. Johney FrameAllred thought it would be good if we could avoid PPM implant long term. Lifestyle modification and the importance of assuming a recumbent position should he developed presyncope was reviewed. He was advised to discuss his medication regimen with his PCP. He has been seen, examined and deemed stable for discharge home today by Dr. Johney FrameAllred. He was instructed to keep his scheduled appointment with Dr. Elease HashimotoNahser later this month.   Discharge Vitals: Blood pressure 118/64, pulse 69, temperature 97.6 F (36.4 C), temperature source Oral,  resp. rate 18, height 5\' 10"  (1.778 m), weight 227 lb 4.7 oz (103.1 kg), SpO2 97.00%.   Labs: Lab Results  Component Value Date   WBC 5.0 05/19/2013   HGB 13.6 05/19/2013   HCT 41.0 05/19/2013   MCV 85.1 05/19/2013   PLT 134* 05/19/2013    Recent Labs Lab 05/19/13 0233 05/19/13 0600  NA 142 140  K 3.8 4.1  CL 104 104  CO2 25 24  BUN 14 14  CREATININE 1.15 1.09  CALCIUM 9.3 9.1  PROT 6.9  --   BILITOT 0.6  --   ALKPHOS 85  --   ALT 32  --   AST 30  --   GLUCOSE 141* 87   Lab Results  Component Value Date   TROPONINI <0.30 05/19/2013     Recent Labs  05/19/13 0233  INR 1.15    Disposition:  The patient is being discharged in stable condition.  Follow-up: Follow-up Information   Follow up with Elyn AquasPhilip Nahser Jr., MD On 06/06/2013. (At 8:45 AM)    Specialty:  Cardiology   Contact information:   [address] [place] [address] [phone]      Discharge Medications:    Medication List         albuterol (2.5 MG/3ML) 0.083% nebulizer solution  Commonly known as:  PROVENTIL  Take 2.5 mg by nebulization every 6 (six) hours as needed for wheezing or shortness of breath.     amphetamine-dextroamphetamine 30 MG 24 hr capsule  Commonly known as:  ADDERALL XR  Take 30 mg by mouth 2 (two) times daily.     lamoTRIgine 25  MG tablet  Commonly known as:  LAMICTAL  Take 25 mg by mouth daily.     lamoTRIgine 100 MG tablet  Commonly known as:  LAMICTAL  Take 100 mg by mouth daily.     traZODone 150 MG tablet  Commonly known as:  DESYREL  Take 150 mg by mouth at bedtime as needed for sleep.       Duration of Discharge Encounter: Greater than 30 minutes including physician time.  Jorja LoaSigned, Brooke O Edmisten, PA-C 05/19/2013, 4:37 PM   Hillis RangeJames Ravindra Baranek MD

## 2013-05-19 NOTE — Progress Notes (Signed)
  Echocardiogram 2D Echocardiogram has been performed.  Warren Mcbride 05/19/2013, 12:56 PM

## 2013-05-19 NOTE — Discharge Instructions (Signed)
NO DRIVING for 6 months until given clearance by Dr. Elease HashimotoNahser.

## 2013-06-06 ENCOUNTER — Encounter (INDEPENDENT_AMBULATORY_CARE_PROVIDER_SITE_OTHER): Payer: Self-pay

## 2013-06-06 ENCOUNTER — Ambulatory Visit (INDEPENDENT_AMBULATORY_CARE_PROVIDER_SITE_OTHER): Payer: Managed Care, Other (non HMO) | Admitting: Cardiovascular Disease

## 2013-06-06 ENCOUNTER — Encounter: Payer: Self-pay | Admitting: Cardiovascular Disease

## 2013-06-06 VITALS — BP 127/66 | HR 64 | Ht 70.0 in | Wt 229.2 lb

## 2013-06-06 DIAGNOSIS — R55 Syncope and collapse: Secondary | ICD-10-CM

## 2013-06-06 NOTE — Assessment & Plan Note (Signed)
The patient has resting bradycardia.  Was found to have a low atrial rhythm with Mobitz type I block. He is admitted to the hospital and has seen Dr. Johney Frame in consultation. At this point he is completely asymptomatic. With exercise his heart rate increases normally.  I've not made any changes. He can see [place] on an as-needed basis. If he does need further followup , he should follow up with the EP team since this is a rhythm issue- he has seen Dr. Johney Frame in the past.    I have advised him to call us if he has any further lightheadedness or dizziness.

## 2013-06-06 NOTE — Progress Notes (Signed)
Warren Mcbride Date of Birth  [birth year]       [facility]    [place] [address]  [address] [place], [place]  [address]   [place], [place]  [address] [phone]     [phone]   Fax  [phone]     Fax [phone]  Problem List: 1. Dizziness 2. Mobitz type 1/ Wenchkeback AV block  History of Present Illness:  Warren Mcbride is a 18 yo who was admitted to the hospital several weeks ago for episodes of dizziness and palpitations. He was seen by Dr. Johney Frame.   Warren Mcbride has been eating better and has been doing just onset since hospitalization. He exercises on regular basis. He is quite active. He works at [employer] has not had any episodes of dizziness or lightheadedness.  Current Outpatient Prescriptions on File Prior to Visit  Medication Sig Dispense Refill  . albuterol (PROVENTIL) (2.5 MG/3ML) 0.083% nebulizer solution Take 2.5 mg by nebulization every 6 (six) hours as needed for wheezing or shortness of breath.      . amphetamine-dextroamphetamine (ADDERALL XR) 30 MG 24 hr capsule Take 30 mg by mouth 2 (two) times daily.      Marland Kitchen lamoTRIgine (LAMICTAL) 100 MG tablet Take 100 mg by mouth daily.      Marland Kitchen lamoTRIgine (LAMICTAL) 25 MG tablet Take 25 mg by mouth daily.      . traZODone (DESYREL) 150 MG tablet Take 150 mg by mouth at bedtime as needed for sleep.       No current facility-administered medications on file prior to visit.    No Known Allergies  Past Medical History  Diagnosis Date  . Asthma   . Anxiety   . ADHD (attention deficit hyperactivity disorder)   . Depression   . 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 Johney Frame - no PPM indicated at the time; AV block due to increased vagal tone     Past Surgical History  Procedure Laterality Date  . No past surgeries      History  Smoking status  . Current Every Day Smoker -- 0.25 packs/day for 4 years  . Types: Cigarettes  Smokeless tobacco  . Never  Used    History  Alcohol Use No    No family history on file.  Reviw of Systems:  Reviewed in the HPI.  All other systems are negative.  Physical Exam: Blood pressure 127/66, pulse 64, height 5\' 10"  (1.778 m), weight 229 lb 3.2 oz (103.964 kg). Wt Readings from Last 3 Encounters:  06/06/13 229 lb 3.2 oz (103.964 kg) (98%*, Z = 2.06)  05/19/13 227 lb 4.7 oz (103.1 kg) (98%*, Z = 2.03)   * Growth percentiles are based on CDC 2-20 Years data.     General: Well developed, well nourished, in no acute distress.  Head: Normocephalic, atraumatic, sclera non-icteric, mucus membranes are moist,   Neck: Supple. Carotids are 2 + without bruits. No JVD   Lungs: Clear   Heart:   RR, occasional pause  Abdomen: Soft, non-tender, non-distended with normal bowel sounds.  Msk:  Strength and tone are normal   Extremities: No clubbing or cyanosis. No edema.  Distal pedal pulses are 2+ and equal   Neuro: CN II - XII intact.  Alert and oriented X 3.   Psych:  Normal   ECG: May not, 2015: Well atrial rhythm rate 45.  Mobitz type I  block   Assessment / Plan:

## 2013-06-06 NOTE — Patient Instructions (Signed)
Follow up with Dr.Allred if needed.

## 2013-08-11 ENCOUNTER — Emergency Department (HOSPITAL_COMMUNITY)
Admission: EM | Admit: 2013-08-11 | Discharge: 2013-08-11 | Disposition: A | Discharge status: Home / Self Care | Payer: No Typology Code available for payment source | Attending: Emergency Medicine | Admitting: Emergency Medicine

## 2013-08-11 ENCOUNTER — Emergency Department (HOSPITAL_COMMUNITY): Payer: No Typology Code available for payment source

## 2013-08-11 ENCOUNTER — Encounter (HOSPITAL_COMMUNITY): Payer: Self-pay | Admitting: Emergency Medicine

## 2013-08-11 DIAGNOSIS — F329 Major depressive disorder, single episode, unspecified: Secondary | ICD-10-CM | POA: Insufficient documentation

## 2013-08-11 DIAGNOSIS — Z79899 Other long term (current) drug therapy: Secondary | ICD-10-CM | POA: Insufficient documentation

## 2013-08-11 DIAGNOSIS — S9000XA Contusion of unspecified ankle, initial encounter: Secondary | ICD-10-CM | POA: Insufficient documentation

## 2013-08-11 DIAGNOSIS — R5381 Other malaise: Secondary | ICD-10-CM | POA: Insufficient documentation

## 2013-08-11 DIAGNOSIS — F17200 Nicotine dependence, unspecified, uncomplicated: Secondary | ICD-10-CM | POA: Insufficient documentation

## 2013-08-11 DIAGNOSIS — S8001XA Contusion of right knee, initial encounter: Secondary | ICD-10-CM

## 2013-08-11 DIAGNOSIS — Z8679 Personal history of other diseases of the circulatory system: Secondary | ICD-10-CM | POA: Insufficient documentation

## 2013-08-11 DIAGNOSIS — Y92410 Unspecified street and highway as the place of occurrence of the external cause: Secondary | ICD-10-CM | POA: Insufficient documentation

## 2013-08-11 DIAGNOSIS — F3289 Other specified depressive episodes: Secondary | ICD-10-CM | POA: Insufficient documentation

## 2013-08-11 DIAGNOSIS — F909 Attention-deficit hyperactivity disorder, unspecified type: Secondary | ICD-10-CM | POA: Insufficient documentation

## 2013-08-11 DIAGNOSIS — Y9389 Activity, other specified: Secondary | ICD-10-CM | POA: Insufficient documentation

## 2013-08-11 DIAGNOSIS — S99929A Unspecified injury of unspecified foot, initial encounter: Principal | ICD-10-CM

## 2013-08-11 DIAGNOSIS — S99919A Unspecified injury of unspecified ankle, initial encounter: Principal | ICD-10-CM

## 2013-08-11 DIAGNOSIS — S9001XA Contusion of right ankle, initial encounter: Secondary | ICD-10-CM

## 2013-08-11 DIAGNOSIS — S8000XA Contusion of unspecified knee, initial encounter: Secondary | ICD-10-CM | POA: Insufficient documentation

## 2013-08-11 DIAGNOSIS — F411 Generalized anxiety disorder: Secondary | ICD-10-CM | POA: Insufficient documentation

## 2013-08-11 DIAGNOSIS — J45909 Unspecified asthma, uncomplicated: Secondary | ICD-10-CM | POA: Insufficient documentation

## 2013-08-11 DIAGNOSIS — R5383 Other fatigue: Secondary | ICD-10-CM

## 2013-08-11 DIAGNOSIS — S8990XA Unspecified injury of unspecified lower leg, initial encounter: Secondary | ICD-10-CM | POA: Insufficient documentation

## 2013-08-11 MED ORDER — IBUPROFEN 600 MG PO TABS: 600.0000 mg | ORAL_TABLET | Freq: Three times a day (TID) | ORAL | Status: DC | PRN

## 2013-08-11 MED ORDER — IBUPROFEN 200 MG PO TABS
600.0000 mg | ORAL_TABLET | Freq: Once | ORAL | Status: AC
  Administered 2013-08-11: 600 mg via ORAL
  Filled 2013-08-11: qty 3

## 2013-08-11 MED ORDER — HYDROCODONE-ACETAMINOPHEN 5-325 MG PO TABS
1.0000 | ORAL_TABLET | Freq: Once | ORAL | Status: AC
  Administered 2013-08-11: 1 via ORAL
  Filled 2013-08-11: qty 1

## 2013-08-11 MED ORDER — HYDROCODONE-ACETAMINOPHEN 5-325 MG PO TABS: 1.0000 | ORAL_TABLET | Freq: Four times a day (QID) | ORAL | Status: DC | PRN

## 2013-08-11 NOTE — Discharge Instructions (Signed)
Contusion A contusion is a deep bruise. Contusions happen when an injury causes bleeding under the skin. Signs of bruising include pain, puffiness (swelling), and discolored skin. The contusion may turn blue, purple, or yellow. HOME CARE   Put ice on the injured area.  Put ice in a plastic bag.  Place a towel between your skin and the bag.  Leave the ice on for 15-20 minutes, 03-04 times a day.  Only take medicine as told by your doctor.  Rest the injured area.  If possible, raise (elevate) the injured area to lessen puffiness. GET HELP RIGHT AWAY IF:   You have more bruising or puffiness.  You have pain that is getting worse.  Your puffiness or pain is not helped by medicine. MAKE SURE YOU:   Understand these instructions.  Will watch your condition.  Will get help right away if you are not doing well or get worse. Document Released: 06/16/2007 Document Revised: 03/22/2011 Document Reviewed: 11/02/2010 [facility] Patient Information 2015 [place], [place]. This information is not intended to replace advice given to you by your health care provider. Make sure you discuss any questions you have with your health care provider.  Cryotherapy Cryotherapy is when you put ice on your injury. Ice helps lessen pain and puffiness (swelling) after an injury. Ice works the best when you start using it in the first 24 to 48 hours after an injury. HOME CARE  Put a dry or damp towel between the ice pack and your skin.  You may press gently on the ice pack.  Leave the ice on for no more than 10 to 20 minutes at a time.  Check your skin after 5 minutes to make sure your skin is okay.  Rest at least 20 minutes between ice pack uses.  Stop using ice when your skin loses feeling (numbness).  Do not use ice on someone who cannot tell you when it hurts. This includes small children and people with memory problems (dementia). GET HELP RIGHT AWAY IF:  You have white spots on your  skin.  Your skin turns blue or pale.  Your skin feels waxy or hard.  Your puffiness gets worse. MAKE SURE YOU:   Understand these instructions.  Will watch your condition.  Will get help right away if you are not doing well or get worse. Document Released: 06/16/2007 Document Revised: 03/22/2011 Document Reviewed: 08/20/2010 [facility] Patient Information 2015 [place], [place]. This information is not intended to replace advice given to you by your health care provider. Make sure you discuss any questions you have with your health care provider.  Cryotherapy Cryotherapy is when you put ice on your injury. Ice helps lessen pain and puffiness (swelling) after an injury. Ice works the best when you start using it in the first 24 to 48 hours after an injury. HOME CARE  Put a dry or damp towel between the ice pack and your skin.  You may press gently on the ice pack.  Leave the ice on for no more than 10 to 20 minutes at a time.  Check your skin after 5 minutes to make sure your skin is okay.  Rest at least 20 minutes between ice pack uses.  Stop using ice when your skin loses feeling (numbness).  Do not use ice on someone who cannot tell you when it hurts. This includes small children and people with memory problems (dementia). GET HELP RIGHT AWAY IF:  You have white spots on your skin.  Your  skin turns blue or pale.  Your skin feels waxy or hard.  Your puffiness gets worse. MAKE SURE YOU:   Understand these instructions.  Will watch your condition.  Will get help right away if you are not doing well or get worse. Document Released: 06/16/2007 Document Revised: 03/22/2011 Document Reviewed: 08/20/2010 [facility] Patient Information 2015 [place], [place]. This information is not intended to replace advice given to you by your health care provider. Make sure you discuss any questions you have with your health care provider.

## 2013-08-11 NOTE — ED Notes (Signed)
Patient is alert and oriented x3.  He was given DC instructions and follow up visit instructions.  Patient gave verbal understanding.  He was DC ambulatory under his own power to home.  V/S stable.  He was not showing any signs of distress on DC 
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
## 2013-08-13 NOTE — ED Provider Notes (Signed)
Medical screening examination/treatment/procedure(s) were performed by non-physician practitioner and as supervising physician I was immediately available for consultation/collaboration.   EKG Interpretation None       Archer Moist, MD 08/13/13 1105 

## 2014-02-16 ENCOUNTER — Emergency Department (HOSPITAL_COMMUNITY)
Admission: EM | Admit: 2014-02-16 | Discharge: 2014-02-16 | Disposition: A | Discharge status: Home / Self Care | Payer: Medicaid Other | Attending: Emergency Medicine | Admitting: Emergency Medicine

## 2014-02-16 ENCOUNTER — Encounter (HOSPITAL_COMMUNITY): Payer: Self-pay

## 2014-02-16 DIAGNOSIS — K529 Noninfective gastroenteritis and colitis, unspecified: Secondary | ICD-10-CM | POA: Diagnosis not present

## 2014-02-16 DIAGNOSIS — J45909 Unspecified asthma, uncomplicated: Secondary | ICD-10-CM | POA: Diagnosis not present

## 2014-02-16 DIAGNOSIS — Z9049 Acquired absence of other specified parts of digestive tract: Secondary | ICD-10-CM | POA: Diagnosis not present

## 2014-02-16 DIAGNOSIS — Z79899 Other long term (current) drug therapy: Secondary | ICD-10-CM | POA: Insufficient documentation

## 2014-02-16 DIAGNOSIS — F909 Attention-deficit hyperactivity disorder, unspecified type: Secondary | ICD-10-CM | POA: Diagnosis not present

## 2014-02-16 DIAGNOSIS — F419 Anxiety disorder, unspecified: Secondary | ICD-10-CM | POA: Insufficient documentation

## 2014-02-16 DIAGNOSIS — F329 Major depressive disorder, single episode, unspecified: Secondary | ICD-10-CM | POA: Diagnosis not present

## 2014-02-16 DIAGNOSIS — Z720 Tobacco use: Secondary | ICD-10-CM | POA: Diagnosis not present

## 2014-02-16 DIAGNOSIS — R109 Unspecified abdominal pain: Secondary | ICD-10-CM | POA: Diagnosis present

## 2014-02-16 LAB — COMPREHENSIVE METABOLIC PANEL
ALT: 60 U/L — AB (ref 0–53)
ANION GAP: 13 (ref 5–15)
AST: 43 U/L — ABNORMAL HIGH (ref 0–37)
Albumin: 4.7 g/dL (ref 3.5–5.2)
Alkaline Phosphatase: 78 U/L (ref 39–117)
BILIRUBIN TOTAL: 1.1 mg/dL (ref 0.3–1.2)
BUN: 15 mg/dL (ref 6–23)
CALCIUM: 10.1 mg/dL (ref 8.4–10.5)
CO2: 19 mmol/L (ref 19–32)
CREATININE: 1.21 mg/dL (ref 0.50–1.35)
Chloride: 105 mmol/L (ref 96–112)
GFR, EST NON AFRICAN AMERICAN: 85 mL/min — AB (ref >90)
Glucose, Bld: 166 mg/dL — ABNORMAL HIGH (ref 70–99)
POTASSIUM: 3.7 mmol/L (ref 3.5–5.1)
Sodium: 137 mmol/L (ref 135–145)
TOTAL PROTEIN: 7.7 g/dL (ref 6.0–8.3)

## 2014-02-16 LAB — CBC WITH DIFFERENTIAL/PLATELET
BASOS PCT: 0 % (ref 0–1)
Basophils Absolute: 0 10*3/uL (ref 0.0–0.1)
Eosinophils Absolute: 0 10*3/uL (ref 0.0–0.7)
Eosinophils Relative: 0 % (ref 0–5)
HCT: 46.4 % (ref 39.0–52.0)
Hemoglobin: 15.5 g/dL (ref 13.0–17.0)
LYMPHS PCT: 7 % — AB (ref 12–46)
Lymphs Abs: 0.8 10*3/uL (ref 0.7–4.0)
MCH: 28 pg (ref 26.0–34.0)
MCHC: 33.4 g/dL (ref 30.0–36.0)
MCV: 83.8 fL (ref 78.0–100.0)
MONOS PCT: 3 % (ref 3–12)
Monocytes Absolute: 0.3 10*3/uL (ref 0.1–1.0)
NEUTROS PCT: 90 % — AB (ref 43–77)
Neutro Abs: 10.3 10*3/uL — ABNORMAL HIGH (ref 1.7–7.7)
Platelets: 153 10*3/uL (ref 150–400)
RBC: 5.54 MIL/uL (ref 4.22–5.81)
RDW: 13.4 % (ref 11.5–15.5)
WBC: 11.5 10*3/uL — ABNORMAL HIGH (ref 4.0–10.5)

## 2014-02-16 LAB — URINALYSIS, ROUTINE W REFLEX MICROSCOPIC
BILIRUBIN URINE: NEGATIVE
Glucose, UA: 100 mg/dL — AB
Hgb urine dipstick: NEGATIVE
Ketones, ur: NEGATIVE mg/dL
Leukocytes, UA: NEGATIVE
Nitrite: NEGATIVE
PROTEIN: NEGATIVE mg/dL
SPECIFIC GRAVITY, URINE: 1.031 — AB (ref 1.005–1.030)
Urobilinogen, UA: 0.2 mg/dL (ref 0.0–1.0)
pH: 6.5 (ref 5.0–8.0)

## 2014-02-16 LAB — MAGNESIUM: MAGNESIUM: 1.3 mg/dL — AB (ref 1.5–2.5)

## 2014-02-16 LAB — LIPASE, BLOOD: Lipase: 15 U/L (ref 11–59)

## 2014-02-16 MED ORDER — MORPHINE SULFATE 4 MG/ML IJ SOLN
4.0000 mg | Freq: Once | INTRAMUSCULAR | Status: AC
  Administered 2014-02-16: 4 mg via INTRAVENOUS
  Filled 2014-02-16: qty 1

## 2014-02-16 MED ORDER — MAGNESIUM SULFATE 2 GM/50ML IV SOLN
2.0000 g | Freq: Once | INTRAVENOUS | Status: AC
  Administered 2014-02-16: 2 g via INTRAVENOUS
  Filled 2014-02-16: qty 50

## 2014-02-16 MED ORDER — IBUPROFEN 600 MG PO TABS: 600.0000 mg | ORAL_TABLET | Freq: Four times a day (QID) | ORAL | Status: DC | PRN

## 2014-02-16 MED ORDER — TRAMADOL HCL 50 MG PO TABS: 50.0000 mg | ORAL_TABLET | Freq: Four times a day (QID) | ORAL | Status: DC | PRN

## 2014-02-16 MED ORDER — ONDANSETRON HCL 4 MG/2ML IJ SOLN
4.0000 mg | Freq: Once | INTRAMUSCULAR | Status: AC
  Administered 2014-02-16: 4 mg via INTRAVENOUS
  Filled 2014-02-16: qty 2

## 2014-02-16 MED ORDER — ONDANSETRON 8 MG PO TBDP: 8.0000 mg | ORAL_TABLET | Freq: Three times a day (TID) | ORAL | Status: DC | PRN

## 2014-02-16 MED ORDER — SODIUM CHLORIDE 0.9 % IV BOLUS (SEPSIS)
1000.0000 mL | Freq: Once | INTRAVENOUS | Status: AC
  Administered 2014-02-16: 1000 mL via INTRAVENOUS

## 2014-02-16 NOTE — ED Notes (Signed)
Pt c/o lower abdominal pain and n/v/d starting around 0200.  Pain score 9/10.  Pt denies being around anyone sick, new medications, and drugs.

## 2014-02-16 NOTE — Discharge Instructions (Signed)
We suspect that you have gastroenteritis or stomach flu. Return to the Er if you have high fevers, bloody stools, inability to keep any fluids or meds down. Take the meds provided. Drink plenty of fluids. Start with liquid diet, and slowly advance to solids.   Viral Gastroenteritis Viral gastroenteritis is also known as stomach flu. This condition affects the stomach and intestinal tract. It can cause sudden diarrhea and vomiting. The illness typically lasts 3 to 8 days. Most people develop an immune response that eventually gets rid of the virus. While this natural response develops, the virus can make you quite ill. CAUSES  Many different viruses can cause gastroenteritis, such as rotavirus or noroviruses. You can catch one of these viruses by consuming contaminated food or water. You may also catch a virus by sharing utensils or other personal items with an infected person or by touching a contaminated surface. SYMPTOMS  The most common symptoms are diarrhea and vomiting. These problems can cause a severe loss of body fluids (dehydration) and a body salt (electrolyte) imbalance. Other symptoms may include:  Fever.  Headache.  Fatigue.  Abdominal pain. DIAGNOSIS  Your caregiver can usually diagnose viral gastroenteritis based on your symptoms and a physical exam. A stool sample may also be taken to test for the presence of viruses or other infections. TREATMENT  This illness typically goes away on its own. Treatments are aimed at rehydration. The most serious cases of viral gastroenteritis involve vomiting so severely that you are not able to keep fluids down. In these cases, fluids must be given through an intravenous line (IV). HOME CARE INSTRUCTIONS   Drink enough fluids to keep your urine clear or pale yellow. Drink small amounts of fluids frequently and increase the amounts as tolerated.  Ask your caregiver for specific rehydration instructions.  Avoid:  Foods high in  sugar.  Alcohol.  Carbonated drinks.  Tobacco.  Juice.  Caffeine drinks.  Extremely hot or cold fluids.  Fatty, greasy foods.  Too much intake of anything at one time.  Dairy products until 24 to 48 hours after diarrhea stops.  You may consume probiotics. Probiotics are active cultures of beneficial bacteria. They may lessen the amount and number of diarrheal stools in adults. Probiotics can be found in yogurt with active cultures and in supplements.  Wash your hands well to avoid spreading the virus.  Only take over-the-counter or prescription medicines for pain, discomfort, or fever as directed by your caregiver. Do not give aspirin to children. Antidiarrheal medicines are not recommended.  Ask your caregiver if you should continue to take your regular prescribed and over-the-counter medicines.  Keep all follow-up appointments as directed by your caregiver. SEEK IMMEDIATE MEDICAL CARE IF:   You are unable to keep fluids down.  You do not urinate at least once every 6 to 8 hours.  You develop shortness of breath.  You notice blood in your stool or vomit. This may look like coffee grounds.  You have abdominal pain that increases or is concentrated in one small area (localized).  You have persistent vomiting or diarrhea.  You have a fever.  The patient is a child younger than 3 months, and he or she has a fever.  The patient is a child older than 3 months, and he or she has a fever and persistent symptoms.  The patient is a child older than 3 months, and he or she has a fever and symptoms suddenly get worse.  The patient is a  baby, and he or she has no tears when crying. MAKE SURE YOU:   Understand these instructions.  Will watch your condition.  Will get help right away if you are not doing well or get worse. Document Released: 12/28/2004 Document Revised: 03/22/2011 Document Reviewed: 10/14/2010 [facility] Patient Information 2015 [place], [place]. This  information is not intended to replace advice given to you by your health care provider. Make sure you discuss any questions you have with your health care provider. Soft-Food Meal Plan A soft-food meal plan includes foods that are safe and easy to swallow. This meal plan typically is used:  If you are having trouble chewing or swallowing foods.  As a transition meal plan after only having had liquid meals for a long period. WHAT DO I NEED TO KNOW ABOUT THE SOFT-FOOD MEAL PLAN? A soft-food meal plan includes tender foods that are soft and easy to chew and swallow. In most cases, bite-sized pieces of food are easier to swallow. A bite-sized piece is about  inch or smaller. Foods in this plan do not need to be ground or pureed. Foods that are very hard, crunchy, or sticky should be avoided. Also, breads, cereals, yogurts, and desserts with nuts, seeds, or fruits should be avoided. WHAT FOODS CAN I EAT? Grains Rice and wild rice. Moist bread, dressing, pasta, and noodles. Well-moistened dry or cooked cereals, such as farina (cooked wheat cereal), oatmeal, or grits. Biscuits, breads, muffins, pancakes, and waffles that have been well moistened. Vegetables Shredded lettuce. Cooked, tender vegetables, including potatoes without skins. Vegetable juices. Broths or creamed soups made with vegetables that are not stringy or chewy. Strained tomatoes (without seeds). Fruits Canned or well-cooked fruits. Soft (ripe), peeled fresh fruits, such as peaches, nectarines, kiwi, cantaloupe, honeydew melon, and watermelon (without seeds). Soft berries with small seeds, such as strawberries. Fruit juices (without pulp). Meats and Other Protein Sources Moist, tender, lean beef. Mutton. Lamb. Veal. Chicken. [place]. Liver. Ham. Fish without bones. Eggs. Dairy Milk, milk drinks, and cream. Plain cream cheese and cottage cheese. Plain yogurt. Sweets/Desserts Flavored gelatin desserts. Custard. Plain ice cream, frozen  yogurt, sherbet, milk shakes, and malts. Plain cakes and cookies. Plain hard candy.  Other Butter, margarine (without trans fat), and cooking oils. Mayonnaise. Cream sauces. Mild spices, salt, and sugar. Syrup, molasses, honey, and jelly. The items listed above may not be a complete list of recommended foods or beverages. Contact your dietitian for more options. WHAT FOODS ARE NOT RECOMMENDED? Grains Dry bread, toast, crackers that have not been moistened. Coarse or dry cereals, such as bran, granola, and shredded wheat. Tough or chewy crusty breads, such as [place] bread or baguettes. Vegetables Corn. Raw vegetables except shredded lettuce. Cooked vegetables that are tough or stringy. Tough, crisp, fried potatoes and potato skins. Fruits Fresh fruits with skins or seeds or both, such as apples, pears, or grapes. Stringy, high-pulp fruits, such as papaya, pineapple, coconut, or mango. Fruit leather, fruit roll-ups, and all dried fruits. Meats and Other Protein Sources Sausages and hot dogs. Meats with gristle. Fish with bones. Nuts, seeds, and chunky peanut or other nut butters. Sweets/Desserts Cakes or cookies that are very dry or chewy.  The items listed above may not be a complete list of foods and beverages to avoid. Contact your dietitian for more information. Document Released: 04/06/2007 Document Revised: 01/02/2013 Document Reviewed: 11/24/2012 [facility] Patient Information 2015 [place], [place]. This information is not intended to replace advice given to you by your health care provider. Make  sure you discuss any questions you have with your health care provider. Clear Liquid Diet A clear liquid diet is a short-term diet that is prescribed to provide the necessary fluid and basic energy you need when you can have nothing else. The clear liquid diet consists of liquids or solids that will become liquid at room temperature. You should be able to see through the liquid. There are many reasons  that you may be restricted to clear liquids, such as:  When you have a sudden-onset (acute) condition that occurs before or after surgery.  To help your body slowly get adjusted to food again after a long period when you were unable to have food.  Replacement of fluids when you have a diarrheal disease.  When you are going to have certain exams, such as a colonoscopy, in which instruments are inserted inside your body to look at parts of your digestive system. WHAT CAN I HAVE? A clear liquid diet does not provide all the nutrients you need. It is important to choose a variety of the following items to get as many nutrients as possible:  Vegetable juices that do not have pulp.  Fruit juices and fruit drinks that do not have pulp.  Coffee (regular or decaffeinated), tea, or soda at the discretion of your health care provider.  Clear bouillon, broth, or strained broth-based soups.  High-protein and flavored gelatins.  Sugar or honey.  Ices or frozen ice pops that do not contain milk. If you are not sure whether you can have certain items, you should ask your health care provider. You may also ask your health care provider if there are any other clear liquid options. Document Released: 12/28/2004 Document Revised: 01/02/2013 Document Reviewed: 11/24/2012 [facility] Patient Information 2015 [place], [place]. This information is not intended to replace advice given to you by your health care provider. Make sure you discuss any questions you have with your health care provider.

## 2014-02-16 NOTE — ED Provider Notes (Signed)
CSN: [id number]     Arrival date & time 02/16/14  1104 History   First MD Initiated Contact with Patient 02/16/14 1111     Chief Complaint  Patient presents with  . Abdominal Pain  . Emesis  . Diarrhea     (Consider location/radiation/quality/duration/timing/severity/associated sxs/prior Treatment) HPI Comments: Pt comes in with cc of abd pain, n/v/diarrhea. Symptoms started around 2 am, when he got up to go to the bathroom. Pt had a loose BM, followed by emesis and then diarrhea. Pt has had 10+ loose BM and also emesis. No blood with either. Pt has sharp and crampy lower quadrants abd pain, non radiating. He had spaghetti for dinner, mother had the same food and is not having any symptoms. No pain meds taken at home.   ROS 10 Systems reviewed and are negative for acute change except as noted in the HPI.     Patient is a 19 y.o. male presenting with abdominal pain, vomiting, and diarrhea. The history is provided by the patient.  Abdominal Pain Associated symptoms: diarrhea and vomiting   Associated symptoms: no chest pain, no cough, no dysuria and no shortness of breath   Emesis Associated symptoms: abdominal pain and diarrhea   Diarrhea Associated symptoms: abdominal pain and vomiting     Past Medical History  Diagnosis Date  . Asthma   . Anxiety   . ADHD (attention deficit hyperactivity disorder)   . Depression   . 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 Johney FrameAllred - no PPM indicated at the time; AV block due to increased vagal tone    Past Surgical History  Procedure Laterality Date  . No past surgeries     History reviewed. No pertinent family history. History  Substance Use Topics  . Smoking status: Current Every Day Smoker -- 0.25 packs/day for 4 years    Types: Cigarettes  . Smokeless tobacco: Never Used  . Alcohol Use: No    Review of Systems  Constitutional: Negative for activity change and appetite change.  Respiratory: Negative for cough  and shortness of breath.   Cardiovascular: Negative for chest pain.  Gastrointestinal: Positive for vomiting, abdominal pain and diarrhea.  Genitourinary: Negative for dysuria.  Neurological: Negative for light-headedness.      Allergies  Review of patient's allergies indicates no known allergies.  Home Medications   Prior to Admission medications   Medication Sig Start Date End Date Taking? Authorizing Provider  amphetamine-dextroamphetamine (ADDERALL XR) 30 MG 24 hr capsule Take 30 mg by mouth 2 (two) times daily.   Yes Historical Provider, MD  lamoTRIgine (LAMICTAL) 200 MG tablet Take 200 mg by mouth daily.   Yes Historical Provider, MD  traZODone (DESYREL) 150 MG tablet Take 150 mg by mouth at bedtime as needed for sleep.   Yes Historical Provider, MD  HYDROcodone-acetaminophen (NORCO/VICODIN) 5-325 MG per tablet Take 1 tablet by mouth every 6 (six) hours as needed for moderate pain. Patient not taking: Reported on 02/16/2014 08/11/13   Arman FilterGail K Schulz, NP  ibuprofen (ADVIL,MOTRIN) 600 MG tablet Take 1 tablet (600 mg total) by mouth every 6 (six) hours as needed. 02/16/14   Derwood KaplanAnkit Kenyia Wambolt, MD  ondansetron (ZOFRAN ODT) 8 MG disintegrating tablet Take 1 tablet (8 mg total) by mouth every 8 (eight) hours as needed for nausea. 02/16/14   Derwood KaplanAnkit Nahsir Venezia, MD  traMADol (ULTRAM) 50 MG tablet Take 1 tablet (50 mg total) by mouth every 6 (six) hours as needed  for severe pain. 02/16/14   Samhita Kretsch Rhunette Croft, MD   BP 105/55 mmHg  Pulse 81  Temp(Src) 97.5 F (36.4 C) (Oral)  Resp 18  SpO2 100% Physical Exam  Constitutional: He is oriented to person, place, and time. He appears well-developed.  HENT:  Head: Normocephalic and atraumatic.  Eyes: Conjunctivae and EOM are normal. Pupils are equal, round, and reactive to light.  Neck: Normal range of motion. Neck supple.  Cardiovascular: Normal rate and regular rhythm.   Pulmonary/Chest: Effort normal and breath sounds normal.  Abdominal: Soft. Bowel sounds  are normal. He exhibits no distension. There is tenderness. There is no rebound and no guarding.  Suprapubic and LLQ  Neurological: He is alert and oriented to person, place, and time.  Skin: Skin is warm.  Nursing note and vitals reviewed.   ED Course  Procedures (including critical care time) Labs Review Labs Reviewed  CBC WITH DIFFERENTIAL/PLATELET - Abnormal; Notable for the following:    WBC 11.5 (*)    Neutrophils Relative % 90 (*)    Neutro Abs 10.3 (*)    Lymphocytes Relative 7 (*)    All other components within normal limits  COMPREHENSIVE METABOLIC PANEL - Abnormal; Notable for the following:    Glucose, Bld 166 (*)    AST 43 (*)    ALT 60 (*)    GFR calc non Af Amer 85 (*)    All other components within normal limits  MAGNESIUM - Abnormal; Notable for the following:    Magnesium 1.3 (*)    All other components within normal limits  URINALYSIS, ROUTINE W REFLEX MICROSCOPIC - Abnormal; Notable for the following:    Specific Gravity, Urine 1.031 (*)    Glucose, UA 100 (*)    All other components within normal limits  LIPASE, BLOOD    Imaging Review No results found.   EKG Interpretation None       11:15 AM Pt is pain free. His nausea has resolved. No emesis here anymore. Stable for discharge, after mag is replaced. Discussed return precautions with patient and family, and they agree with the plan. MDM   Final diagnoses:  Gastroenteritis    PT comes in with cc of abd pain, n/v/diarrhea  Initial impression is gastroenteritis vs. Early appendicitis.  Labs ordered, and pt observed and reassessed over the course of ED stay, and the appendicitis became less likely.  Will d/c. Return precautions discussed.  Derwood Kaplan, MD 02/19/14 [phone]

## 2014-08-06 ENCOUNTER — Ambulatory Visit (INDEPENDENT_AMBULATORY_CARE_PROVIDER_SITE_OTHER): Payer: Medicaid Other | Admitting: Family Medicine

## 2014-08-06 ENCOUNTER — Encounter: Payer: Self-pay | Admitting: Family Medicine

## 2014-08-06 VITALS — BP 123/62 | HR 76 | Temp 97.7°F | Resp 16 | Ht 71.0 in | Wt 241.0 lb

## 2014-08-06 DIAGNOSIS — Z7189 Other specified counseling: Secondary | ICD-10-CM | POA: Diagnosis not present

## 2014-08-06 DIAGNOSIS — Z7689 Persons encountering health services in other specified circumstances: Secondary | ICD-10-CM

## 2014-08-06 NOTE — Progress Notes (Signed)
Patient ID: Warren Mcbride, male   DOB: 1995-01-13, 19 y.o.   MRN: [id number]   Warren Mcbride, is a 19 y.o. male  [id number]    DOB - 08/08/1995  CC:  Chief Complaint  Patient presents with  . Establish Care    dx with chlamydia wants to insure it has resolved         HPI:  Patient is here today to establish care and to check on chalamydia status. He was treated with a gram of zithromax 3 days ago. He has a history of Mild intermittent asthma, ADHD, depression and anxiety. He is seen elsewhere for treatment of ADHD, depression and anxiety.  He has a history of an episode of Second degree AV block, which was evaluated by cardiology. No further episodes in over a year. His family history is unremarkable for chronic illnesses.       No Known Allergies  Past Medical History  Diagnosis Date  . Asthma   . Anxiety   . ADHD (attention deficit hyperactivity disorder)   . Depression   . 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 Johney Mcbride - no PPM indicated at the time; AV block due to increased vagal tone    Warren Mcbride's family history is not on file.  History   Social History  . Marital Status: Single    Spouse Name: N/A  . Number of Children: N/A  . Years of Education: N/A   Occupational History  . Not on file.   Social History Main Topics  . Smoking status: Current Every Day Smoker -- 0.25 packs/day for 4 years    Types: Cigarettes  . Smokeless tobacco: Never Used  . Alcohol Use: No  . Drug Use: 7.00 per week    Special: Marijuana  . Sexual Activity: Yes   Other Topics Concern  . Not on file   Social History Narrative   Lives in [place] with  Mother.  Recently employed at [employer].   Past Surgical History  Procedure Laterality Date  . No past surgeries         ROS:  GEN:   Denies fever, chills,WT loss, loss of appetite Skin:   Denies lesions or rashes HENT:   Denies  earache, epistaxis, sore throat, or neck pain, or headaches EYES:    Denies eye pain or drainage.  Some light sensitivity             LUNGS:  Denies SOB with rest or walking; couging, choking. Positive for coughing and wheezing with occassional asthma flare CV:   Denies CP or palpitations ABD:   Denies abdominal pain, nausea,and vomiting, diarrhea  GU:   Denies frequency, urgency, dysuria          EXT:    Denies muscle spasms or swelling; no pain in lower ext,no unilateral  weakness  NEURO:   Denies numbness or tingling, denies seizures HEM:   Admits to easy bruising.  Objective:  Filed Vitals:   08/06/14 0922  BP: 123/62  Pulse: 76  Temp: 97.7 F (36.5 C)  TempSrc: Oral  Resp: 16  Height:  (1.803 m)  Weight: 241 lb (109.317 kg)    Physical Exam:  General:   Well-developed, well-nourished, in no acute distress Skin:      Warm and dry, no significant rashes.     Head :    Normocephalic, atraumatic, no pallor, Eyes:      No icterus, drainage, PERL, EOMS  intact Ears:      Clear bilaterally, TMs within normal limits. Nose:   nares patent w/o significant congestion or inflammation   Neck:      Supple FROM w/o adenopathy, tenderness, or thyroidomegaly. No JVD     or tracheal diviation Heart:     Normal  RRR,without M,G,R Lungs:    Clear to auscultation bilaterally. No increase in respiratory effort.No   wheezes, rales or stridor Abdomen:  Soft, nontender, nondistended, positive bowel sounds, no guarding or     rebound Exetremeties:  No pedal edema.FROM, 2+pedal pulses, No unilateral weakness. Foot    Neuro:   Alert, oriented, appropriate, non-focal.  Pertinent Lab Results:  Lab Results  Component Value Date   WBC 11.5* 02/16/2014   HGB 15.5 02/16/2014   HCT 46.4 02/16/2014   MCV 83.8 02/16/2014   PLT 153 02/16/2014     Chemistry      Component Value Date/Time   NA 137 02/16/2014 1152   K 3.7 02/16/2014 1152   CL 105 02/16/2014 1152   CO2 19 02/16/2014 1152   BUN 15 02/16/2014 1152   CREATININE 1.21 02/16/2014 1152       Component Value Date/Time   CALCIUM 10.1 02/16/2014 1152   ALKPHOS 78 02/16/2014 1152   AST 43* 02/16/2014 1152   ALT 60* 02/16/2014 1152   BILITOT 1.1 02/16/2014 1152      No results found for: HGBA1C  Medications: Prior to Admission medications   Medication Sig Start Date End Date Taking? Authorizing Provider  amphetamine-dextroamphetamine (ADDERALL XR) 30 MG 24 hr capsule Take 30 mg by mouth 2 (two) times daily.   Yes Historical Provider, MD  lamoTRIgine (LAMICTAL) 200 MG tablet Take 200 mg by mouth daily.   Yes Historical Provider, MD  traZODone (DESYREL) 150 MG tablet Take 150 mg by mouth at bedtime as needed for sleep.   Yes Historical Provider, MD  HYDROcodone-acetaminophen (NORCO/VICODIN) 5-325 MG per tablet Take 1 tablet by mouth every 6 (six) hours as needed for moderate pain. Patient not taking: Reported on 02/16/2014 08/11/13   Earley Favor, NP  ibuprofen (ADVIL,MOTRIN) 600 MG tablet Take 1 tablet (600 mg total) by mouth every 6 (six) hours as needed. Patient not taking: Reported on 08/06/2014 02/16/14   Derwood Kaplan, MD  ondansetron (ZOFRAN ODT) 8 MG disintegrating tablet Take 1 tablet (8 mg total) by mouth every 8 (eight) hours as needed for nausea. Patient not taking: Reported on 08/06/2014 02/16/14   Derwood Kaplan, MD  traMADol (ULTRAM) 50 MG tablet Take 1 tablet (50 mg total) by mouth every 6 (six) hours as needed for severe pain. Patient not taking: Reported on 08/06/2014 02/16/14   Derwood Kaplan, MD    Assessment Plan  Establish care - he has had recent labs in February, which have been reviewed. No further lab work indicated at this time - I have review his social, past and family histories - He is to return in 3 months for health maintenance visit.  Recently treated for chalamydia -return for a lab visit in one week for urine for test of cure.   Follow up:As above.  The patient was given clear instructions to go to ER or return to medical center if symptoms don't  improve, worsen or new problems develop. The patient verbalized understanding.     Henrietta Hoover, FNP,BC 08/06/2014, 9:55 AM

## 2014-08-06 NOTE — Patient Instructions (Signed)
No sexual intercourse until you return for a recheck in one week. Follow-up for a health maintenance visit in 3 months.  Would advise the use of condoms to prevent further STIs

## 2014-08-13 ENCOUNTER — Other Ambulatory Visit: Payer: Managed Care, Other (non HMO)

## 2014-09-21 ENCOUNTER — Encounter (HOSPITAL_COMMUNITY): Payer: Self-pay

## 2014-09-21 ENCOUNTER — Emergency Department (HOSPITAL_COMMUNITY)
Admission: EM | Admit: 2014-09-21 | Discharge: 2014-09-21 | Disposition: A | Discharge status: Home / Self Care | Payer: Medicaid Other | Attending: Emergency Medicine | Admitting: Emergency Medicine

## 2014-09-21 DIAGNOSIS — R509 Fever, unspecified: Secondary | ICD-10-CM

## 2014-09-21 DIAGNOSIS — F419 Anxiety disorder, unspecified: Secondary | ICD-10-CM | POA: Diagnosis not present

## 2014-09-21 DIAGNOSIS — J45909 Unspecified asthma, uncomplicated: Secondary | ICD-10-CM | POA: Insufficient documentation

## 2014-09-21 DIAGNOSIS — R000 Tachycardia, unspecified: Secondary | ICD-10-CM | POA: Insufficient documentation

## 2014-09-21 DIAGNOSIS — Z87891 Personal history of nicotine dependence: Secondary | ICD-10-CM | POA: Insufficient documentation

## 2014-09-21 DIAGNOSIS — Z79899 Other long term (current) drug therapy: Secondary | ICD-10-CM | POA: Diagnosis not present

## 2014-09-21 DIAGNOSIS — R112 Nausea with vomiting, unspecified: Secondary | ICD-10-CM | POA: Insufficient documentation

## 2014-09-21 DIAGNOSIS — F909 Attention-deficit hyperactivity disorder, unspecified type: Secondary | ICD-10-CM | POA: Insufficient documentation

## 2014-09-21 DIAGNOSIS — Z8679 Personal history of other diseases of the circulatory system: Secondary | ICD-10-CM | POA: Diagnosis not present

## 2014-09-21 DIAGNOSIS — F329 Major depressive disorder, single episode, unspecified: Secondary | ICD-10-CM | POA: Insufficient documentation

## 2014-09-21 DIAGNOSIS — J029 Acute pharyngitis, unspecified: Secondary | ICD-10-CM | POA: Insufficient documentation

## 2014-09-21 DIAGNOSIS — M791 Myalgia, unspecified site: Secondary | ICD-10-CM

## 2014-09-21 LAB — RAPID STREP SCREEN (MED CTR MEBANE ONLY): STREPTOCOCCUS, GROUP A SCREEN (DIRECT): NEGATIVE

## 2014-09-21 MED ORDER — DEXAMETHASONE 4 MG PO TABS
10.0000 mg | ORAL_TABLET | Freq: Once | ORAL | Status: AC
  Administered 2014-09-21: 10 mg via ORAL
  Filled 2014-09-21: qty 2
  Filled 2014-09-21: qty 1

## 2014-09-21 MED ORDER — SODIUM CHLORIDE 0.9 % IV BOLUS (SEPSIS)
1000.0000 mL | Freq: Once | INTRAVENOUS | Status: AC
  Administered 2014-09-21: 1000 mL via INTRAVENOUS

## 2014-09-21 MED ORDER — ONDANSETRON 8 MG PO TBDP: 8.0000 mg | ORAL_TABLET | Freq: Three times a day (TID) | ORAL | Status: DC | PRN

## 2014-09-21 MED ORDER — LIDOCAINE VISCOUS 2 % MT SOLN
15.0000 mL | Freq: Once | OROMUCOSAL | Status: AC
  Administered 2014-09-21: 15 mL via OROMUCOSAL
  Filled 2014-09-21: qty 15

## 2014-09-21 MED ORDER — KETOROLAC TROMETHAMINE 30 MG/ML IJ SOLN
30.0000 mg | Freq: Once | INTRAMUSCULAR | Status: AC
  Administered 2014-09-21: 30 mg via INTRAVENOUS
  Filled 2014-09-21: qty 1

## 2014-09-21 MED ORDER — IBUPROFEN 600 MG PO TABS: 600.0000 mg | ORAL_TABLET | Freq: Three times a day (TID) | ORAL | Status: AC

## 2014-09-21 MED ORDER — ACETAMINOPHEN 325 MG PO TABS
650.0000 mg | ORAL_TABLET | Freq: Once | ORAL | Status: AC | PRN
Start: 2014-09-21 — End: 2014-09-21
  Administered 2014-09-21: 650 mg via ORAL
  Filled 2014-09-21: qty 2

## 2014-09-21 NOTE — Discharge Instructions (Signed)
As discussed, your evaluation today has been largely reassuring.  But, it is important that you monitor your condition carefully, and do not hesitate to return to the ED if you develop new, or concerning changes in your condition.  A secondary test is being conducted on the sample from your throat. If this test demonstrates that you have strep throat, you will be notified.  Otherwise, please follow-up with your physician for appropriate ongoing care.

## 2014-09-21 NOTE — ED Provider Notes (Signed)
CSN: [id number]     Arrival date & time 09/21/14  0840 History   First MD Initiated Contact with Patient 09/21/14 [id number]     Chief Complaint  Patient presents with  . Generalized Body Aches  . Sore Throat     (Consider location/radiation/quality/duration/timing/severity/associated sxs/prior Treatment) HPI Patient presents with concerns of sore throat, myalgia, fever, nausea and vomiting. Sore throat was the first symptom, beginning several days ago. The past 24 hours the patient has also developed diffuse myalgia, diffuse weakness, nausea. Patient has been vomiting multiple times. No abdominal pain, no diarrhea, no confusion, disorientation. No dyspnea, chest pain. No relief with OTC medication.   Past Medical History  Diagnosis Date  . Asthma   . Anxiety   . ADHD (attention deficit hyperactivity disorder)   . Depression   . 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 Johney Frame - no PPM indicated at the time; AV block due to increased vagal tone    Past Surgical History  Procedure Laterality Date  . No past surgeries     History reviewed. No pertinent family history. Social History  Substance Use Topics  . Smoking status: Former Smoker -- 0.25 packs/day for 4 years    Types: Cigarettes  . Smokeless tobacco: Never Used  . Alcohol Use: No    Review of Systems  Constitutional:       Per HPI, otherwise negative  HENT:       Per HPI, otherwise negative  Respiratory:       Per HPI, otherwise negative  Cardiovascular:       Per HPI, otherwise negative  Gastrointestinal: Positive for nausea and vomiting. Negative for abdominal pain.  Endocrine:       Negative aside from HPI  Genitourinary:       Neg aside from HPI   Musculoskeletal:       Per HPI, otherwise negative  Skin: Negative.   Neurological: Negative for syncope.      Allergies  Review of patient's allergies indicates no known allergies.  Home Medications   Prior to Admission medications     Medication Sig Start Date End Date Taking? Authorizing Provider  albuterol (PROVENTIL) (2.5 MG/3ML) 0.083% nebulizer solution Take 2.5 mg by nebulization every 6 (six) hours as needed for wheezing or shortness of breath.   Yes Historical Provider, MD  amphetamine-dextroamphetamine (ADDERALL XR) 30 MG 24 hr capsule Take 30 mg by mouth 2 (two) times daily.   Yes Historical Provider, MD  lamoTRIgine (LAMICTAL) 150 MG tablet Take 150 mg by mouth 2 (two) times daily.   Yes Historical Provider, MD  traZODone (DESYREL) 150 MG tablet Take 150 mg by mouth 2 (two) times daily as needed for sleep.    Yes Historical Provider, MD  HYDROcodone-acetaminophen (NORCO/VICODIN) 5-325 MG per tablet Take 1 tablet by mouth every 6 (six) hours as needed for moderate pain. Patient not taking: Reported on 02/16/2014 08/11/13   Earley Favor, NP  ibuprofen (ADVIL,MOTRIN) 600 MG tablet Take 1 tablet (600 mg total) by mouth every 6 (six) hours as needed. Patient not taking: Reported on 08/06/2014 02/16/14   Derwood Kaplan, MD  ondansetron (ZOFRAN ODT) 8 MG disintegrating tablet Take 1 tablet (8 mg total) by mouth every 8 (eight) hours as needed for nausea. Patient not taking: Reported on 08/06/2014 02/16/14   Derwood Kaplan, MD  traMADol (ULTRAM) 50 MG tablet Take 1 tablet (50 mg total) by mouth every 6 (six) hours as needed  for severe pain. Patient not taking: Reported on 08/06/2014 02/16/14   Derwood Kaplan, MD   BP 137/67 mmHg  Pulse 116  Temp(Src) 101.4 F (38.6 C) (Oral)  Resp 20  SpO2 100% Physical Exam  Constitutional: He is oriented to person, place, and time. He appears well-developed. No distress.  HENT:  Head: Normocephalic and atraumatic.  Mouth/Throat:    Eyes: Conjunctivae and EOM are normal.  Cardiovascular: Regular rhythm.  Tachycardia present.   Pulmonary/Chest: Effort normal. No stridor. No respiratory distress.  Abdominal: He exhibits no distension.  Musculoskeletal: He exhibits no edema.  Neurological: He  is alert and oriented to person, place, and time.  Skin: Skin is warm and dry.  Psychiatric: He has a normal mood and affect.  Nursing note and vitals reviewed.   ED Course  Procedures (including critical care time) Labs Review Labs Reviewed  RAPID STREP SCREEN (NOT AT [facility])  CULTURE, GROUP A STREP    10:34 AM Patient states that he feels substantially better. We discussed all findings. With negative point-of-care strep test, culture was sent. Chart review demonstrates the patient is one prior strep test here that was positive, but 4 years ago. He states no history of recurrent strep pharyngitis.   MDM  Patient presents with myalgia, fever, sore throat. Impression improved here with fluid resuscitation, Toradol, Decadron Initial strep test negative. No evidence for airway compromise, bacteremia or sepsis. With his improvement he was discharged in stable condition.  Gerhard Munch, MD 09/21/14 [phone]

## 2014-09-21 NOTE — ED Notes (Signed)
Pt c/o generalized body aches and sore throat x 3 days.  Pain score 10/10.  Pt has not taken anything for symptoms.

## 2014-09-21 NOTE — ED Notes (Signed)
MD at bedside. 

## 2014-09-23 LAB — CULTURE, GROUP A STREP

## 2014-11-06 ENCOUNTER — Ambulatory Visit: Payer: Managed Care, Other (non HMO) | Admitting: Family Medicine

## 2014-12-11 ENCOUNTER — Emergency Department (HOSPITAL_COMMUNITY)
Admission: EM | Admit: 2014-12-11 | Discharge: 2014-12-11 | Disposition: A | Discharge status: Home / Self Care | Payer: Medicaid Other | Attending: Emergency Medicine | Admitting: Emergency Medicine

## 2014-12-11 ENCOUNTER — Encounter (HOSPITAL_COMMUNITY): Payer: Self-pay

## 2014-12-11 ENCOUNTER — Emergency Department (HOSPITAL_COMMUNITY): Payer: Medicaid Other

## 2014-12-11 DIAGNOSIS — J3489 Other specified disorders of nose and nasal sinuses: Secondary | ICD-10-CM | POA: Diagnosis not present

## 2014-12-11 DIAGNOSIS — R0981 Nasal congestion: Secondary | ICD-10-CM | POA: Diagnosis not present

## 2014-12-11 DIAGNOSIS — Z8679 Personal history of other diseases of the circulatory system: Secondary | ICD-10-CM | POA: Diagnosis not present

## 2014-12-11 DIAGNOSIS — R0789 Other chest pain: Secondary | ICD-10-CM | POA: Insufficient documentation

## 2014-12-11 DIAGNOSIS — F419 Anxiety disorder, unspecified: Secondary | ICD-10-CM | POA: Insufficient documentation

## 2014-12-11 DIAGNOSIS — Z87891 Personal history of nicotine dependence: Secondary | ICD-10-CM | POA: Insufficient documentation

## 2014-12-11 DIAGNOSIS — F909 Attention-deficit hyperactivity disorder, unspecified type: Secondary | ICD-10-CM | POA: Diagnosis not present

## 2014-12-11 DIAGNOSIS — R079 Chest pain, unspecified: Secondary | ICD-10-CM

## 2014-12-11 DIAGNOSIS — R042 Hemoptysis: Secondary | ICD-10-CM | POA: Diagnosis not present

## 2014-12-11 DIAGNOSIS — J029 Acute pharyngitis, unspecified: Secondary | ICD-10-CM | POA: Diagnosis not present

## 2014-12-11 DIAGNOSIS — R05 Cough: Secondary | ICD-10-CM

## 2014-12-11 DIAGNOSIS — R059 Cough, unspecified: Secondary | ICD-10-CM

## 2014-12-11 DIAGNOSIS — J45909 Unspecified asthma, uncomplicated: Secondary | ICD-10-CM | POA: Insufficient documentation

## 2014-12-11 DIAGNOSIS — Z79899 Other long term (current) drug therapy: Secondary | ICD-10-CM | POA: Insufficient documentation

## 2014-12-11 DIAGNOSIS — F329 Major depressive disorder, single episode, unspecified: Secondary | ICD-10-CM | POA: Diagnosis not present

## 2014-12-11 LAB — BASIC METABOLIC PANEL
Anion gap: 9 (ref 5–15)
BUN: 15 mg/dL (ref 6–20)
CO2: 25 mmol/L (ref 22–32)
CREATININE: 1.21 mg/dL (ref 0.61–1.24)
Calcium: 9.8 mg/dL (ref 8.9–10.3)
Chloride: 105 mmol/L (ref 101–111)
GFR calc Af Amer: >60 mL/min (ref >60)
GFR calc non Af Amer: >60 mL/min (ref >60)
Glucose, Bld: 94 mg/dL (ref 65–99)
Potassium: 4.1 mmol/L (ref 3.5–5.1)
SODIUM: 139 mmol/L (ref 135–145)

## 2014-12-11 LAB — I-STAT TROPONIN, ED: TROPONIN I, POC: 0.01 ng/mL (ref 0.00–0.08)

## 2014-12-11 LAB — CBC
HCT: 45.3 % (ref 39.0–52.0)
Hemoglobin: 15.3 g/dL (ref 13.0–17.0)
MCH: 28.7 pg (ref 26.0–34.0)
MCHC: 33.8 g/dL (ref 30.0–36.0)
MCV: 85 fL (ref 78.0–100.0)
PLATELETS: 152 10*3/uL (ref 150–400)
RBC: 5.33 MIL/uL (ref 4.22–5.81)
RDW: 14 % (ref 11.5–15.5)
WBC: 5.5 10*3/uL (ref 4.0–10.5)

## 2014-12-11 MED ORDER — IPRATROPIUM-ALBUTEROL 0.5-2.5 (3) MG/3ML IN SOLN: 3.0000 mL | RESPIRATORY_TRACT | Status: DC | PRN

## 2014-12-11 NOTE — ED Provider Notes (Signed)
CSN: [id number]     Arrival date & time 12/11/14  1330 History   First MD Initiated Contact with Patient 12/11/14 1927     Chief Complaint  Patient presents with  . Chest Pain   HPI  Warren Mcbride is 19 -year-old male presenting with chest pain and cough. He states the cough has been there for 9 days. He states it is productive of mucus and twice he has had blood-tinged sputum when he coughed. He is also endorsing chest pains that are located over the sternum and radiates towards the left side of his chest. He states the pain is constant and irritating. He says that the pain is most severe when he is having a coughing fit. He describes the pain as a squeezing or tightening. The chest pain is not associated with diaphoresis, dizziness, shortness of breath or nausea. He is also reporting congestion, rhinorrhea and sore throat. The symptoms began a few days ago. He has not tried any over-the-counter pain relievers or symptom relievers. He also notes that he is an asthmatic and has been using his DuoNeb recently. He states that he does not feel like he is having an asthma attack. Denies fevers, chills, headaches, changes in vision, abdominal pain, vomiting or diarrhea.   Past Medical History  Diagnosis Date  . Asthma   . Anxiety   . ADHD (attention deficit hyperactivity disorder)   . Depression   . 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 Johney FrameAllred - no PPM indicated at the time; AV block due to increased vagal tone    Past Surgical History  Procedure Laterality Date  . No past surgeries     No family history on file. Social History  Substance Use Topics  . Smoking status: Former Smoker -- 0.25 packs/day for 4 years    Types: Cigarettes  . Smokeless tobacco: Never Used  . Alcohol Use: No    Review of Systems  Constitutional: Negative for fever and chills.  HENT: Positive for congestion, rhinorrhea and sore throat.   Respiratory: Positive for cough. Negative for shortness of  breath and wheezing.   Cardiovascular: Positive for chest pain.  Gastrointestinal: Negative for nausea, vomiting and abdominal pain.  All other systems reviewed and are negative.     Allergies  Review of patient's allergies indicates no known allergies.  Home Medications   Prior to Admission medications   Medication Sig Start Date End Date Taking? Authorizing Provider  amphetamine-dextroamphetamine (ADDERALL XR) 30 MG 24 hr capsule Take 30 mg by mouth 2 (two) times daily.   Yes Historical Provider, MD  lamoTRIgine (LAMICTAL) 150 MG tablet Take 150 mg by mouth 2 (two) times daily.   Yes Historical Provider, MD  traZODone (DESYREL) 50 MG tablet Take 50 mg by mouth at bedtime as needed for sleep.  11/26/14  Yes Historical Provider, MD  ipratropium-albuterol (DUONEB) 0.5-2.5 (3) MG/3ML SOLN Take 3 mLs by nebulization every 2 (two) hours as needed. 12/11/14   Ricarda Atayde, PA-C  ondansetron (ZOFRAN ODT) 8 MG disintegrating tablet Take 1 tablet (8 mg total) by mouth every 8 (eight) hours as needed for nausea. Patient not taking: Reported on 12/11/2014 09/21/14   Gerhard Munchobert Lockwood, MD   BP 118/69 mmHg  Pulse 80  Temp(Src) 98 F (36.7 C) (Oral)  Resp 25  SpO2 98% Physical Exam  Constitutional: He appears well-developed and well-nourished. No distress.  Nontoxic appearing in no acute distress  HENT:  Head: Normocephalic  and atraumatic.  Mouth/Throat: Uvula is midline, oropharynx is clear and moist and mucous membranes are normal. No uvula swelling. No oropharyngeal exudate, posterior oropharyngeal edema or posterior oropharyngeal erythema.  Eyes: Conjunctivae are normal. Right eye exhibits no discharge. Left eye exhibits no discharge. No scleral icterus.  Neck: Normal range of motion. Neck supple.  Cardiovascular: Normal rate, regular rhythm and normal heart sounds.   Pulmonary/Chest: Effort normal and breath sounds normal. No respiratory distress. He has no wheezes. He has no rales. He  exhibits tenderness.  Lungs CTAB with good air movement. Chest pain is reproducible over sternum and left anterior chest wall  Abdominal: Soft. He exhibits no distension. There is no tenderness. There is no rebound and no guarding.  Musculoskeletal: Normal range of motion. He exhibits no edema or tenderness.  Moves all extremities spontaneously and without pain  Lymphadenopathy:    He has no cervical adenopathy.  Neurological: He is alert. Coordination normal.  Skin: Skin is warm and dry. No rash noted.  Psychiatric: He has a normal mood and affect. His behavior is normal.  Nursing note and vitals reviewed.   ED Course  Procedures (including critical care time) Labs Review Labs Reviewed  BASIC METABOLIC PANEL  CBC  I-STAT TROPOININ, ED    Imaging Review Dg Chest 2 View  12/11/2014  CLINICAL DATA:  Chest pain increasing for 2 days, cough for 2 weeks with 2 episodes of hemoptysis. Light smoking history. EXAM: CHEST  2 VIEW COMPARISON:  Chest x-ray dated 05/02/2013. FINDINGS: The heart size and mediastinal contours are within normal limits. Both lungs are clear. The visualized skeletal structures are unremarkable. IMPRESSION: Lungs are clear and there is no evidence of acute cardiopulmonary abnormality. Electronically Signed   By: Bary Richard M.D.   On: 12/11/2014 14:05   I have personally reviewed and evaluated these images and lab results as part of my medical decision-making.   EKG Interpretation   Date/Time:  Wednesday December 11 2014 13:38:23 EST Ventricular Rate:  110 PR Interval:  193 QRS Duration: 85 QT Interval:  340 QTC Calculation: 460 R Axis:   79 Text Interpretation:  Sinus tachycardia Prolonged PR interval Benign early  repolarization aside from tachycardia, no significant change from prior  EKG Confirmed by LIU MD, Warren Mcbride ([id number]) on 12/11/2014 1:43:14 PM      MDM   Final diagnoses:  Chest pain, unspecified chest pain type  Cough  Hemoptysis   Patient  presenting with atypical chest pain and cough. Chest pain and cough presented together approx 10 days ago. Chest pain is aggravated by the cough. Cough productive of sputum with occasional blood streaks. Pt also notes recent URI symptoms of congestion and sore throat. VSS. Tachycardic upon presentation but is now in 86s. Pt is nontoxic in NAD. Lungs CTAB without wheeze. Chest pain is reproducible on palpation. Heart RRR. Troponin negative with non-iscehmic ECG. CXR without abnormality. Presentation and workup is not consistent with cardiac or pulmonary pain origin. Likely a URI with chest pain from frequent coughing. Pt has been advised to return to the ED if CP becomes exertional, associated with diaphoresis or nausea, radiates to left jaw/arm, worsens or becomes concerning in any way. Pt reports he is out of his duoneb solution and is requesting a refill. Recommended OTC symptomatic care. Pt appears reliable for follow up and is agreeable to discharge.   Case has been discussed with and seen by Dr. Bebe Mcbride who agrees with the above plan to discharge.  Alveta Heimlich, PA-C 12/11/14 2256  Zadie Rhine, MD 12/12/14 1140

## 2014-12-11 NOTE — Discharge Instructions (Signed)
Schedule a follow up appointment with your PCP. Continue using your inhaler and breathing treatments as needed.    Chest Pain Observation It is often hard to give a specific diagnosis for the cause of chest pain. Among other possibilities your symptoms might be caused by inadequate oxygen delivery to your heart (angina). Angina that is not treated or evaluated can lead to a heart attack (myocardial infarction) or death. Blood tests, electrocardiograms, and X-rays may have been done to help determine a possible cause of your chest pain. After evaluation and observation, your health care provider has determined that it is unlikely your pain was caused by an unstable condition that requires hospitalization. However, a full evaluation of your pain may need to be completed, with additional diagnostic testing as directed. It is very important to keep your follow-up appointments. Not keeping your follow-up appointments could result in permanent heart damage, disability, or death. If there is any problem keeping your follow-up appointments, you must call your health care provider. HOME CARE INSTRUCTIONS  Due to the slight chance that your pain could be angina, it is important to follow your health care provider's treatment plan and also maintain a healthy lifestyle:  Maintain or work toward achieving a healthy weight.  Stay physically active and exercise regularly.  Decrease your salt intake.  Eat a balanced, healthy diet. Talk to a dietitian to learn about heart-healthy foods.  Increase your fiber intake by including whole grains, vegetables, fruits, and nuts in your diet.  Avoid situations that cause stress, anger, or depression.  Take medicines as advised by your health care provider. Report any side effects to your health care provider. Do not stop medicines or adjust the dosages on your own.  Quit smoking. Do not use nicotine patches or gum until you check with your health care provider.  Keep  your blood pressure, blood sugar, and cholesterol levels within normal limits.  Limit alcohol intake to no more than 1 drink per day for women who are not pregnant and 2 drinks per day for men.  Do not abuse drugs. SEEK IMMEDIATE MEDICAL CARE IF: You have severe chest pain or pressure which may include symptoms such as:  You feel pain or pressure in your arms, neck, jaw, or back.  You have severe back or abdominal pain, feel sick to your stomach (nauseous), or throw up (vomit).  You are sweating profusely.  You are having a fast or irregular heartbeat.  You feel short of breath while at rest.  You notice increasing shortness of breath during rest, sleep, or with activity.  You have chest pain that does not get better after rest or after taking your usual medicine.  You wake from sleep with chest pain.  You are unable to sleep because you cannot breathe.  You develop a frequent cough or you are coughing up blood.  You feel dizzy, faint, or experience extreme fatigue.  You develop severe weakness, dizziness, fainting, or chills. Any of these symptoms may represent a serious problem that is an emergency. Do not wait to see if the symptoms will go away. Call your local emergency services (911 in the [place] Do not drive yourself to the hospital. MAKE SURE YOU:  Understand these instructions.  Will watch your condition.  Will get help right away if you are not doing well or get worse.   This information is not intended to replace advice given to you by your health care provider. Make sure you discuss any questions you  have with your health care provider.   Document Released: 01/30/2010 Document Revised: 01/02/2013 Document Reviewed: 06/29/2012 Elsevier Interactive Patient Education 2016 [employer]  Cough, Adult Coughing is a reflex that clears your throat and your airways. Coughing helps to heal and protect your lungs. It is normal to cough occasionally, but a cough that  happens with other symptoms or lasts a long time may be a sign of a condition that needs treatment. A cough may last only 2-3 weeks (acute), or it may last longer than 8 weeks (chronic). CAUSES Coughing is commonly caused by:  Breathing in substances that irritate your lungs.  A viral or bacterial respiratory infection.  Allergies.  Asthma.  Postnasal drip.  Smoking.  Acid backing up from the stomach into the esophagus (gastroesophageal reflux).  Certain medicines.  Chronic lung problems, including COPD (or rarely, lung cancer).  Other medical conditions such as heart failure. HOME CARE INSTRUCTIONS  Pay attention to any changes in your symptoms. Take these actions to help with your discomfort:  Take medicines only as told by your health care provider.  If you were prescribed an antibiotic medicine, take it as told by your health care provider. Do not stop taking the antibiotic even if you start to feel better.  Talk with your health care provider before you take a cough suppressant medicine.  Drink enough fluid to keep your urine clear or pale yellow.  If the air is dry, use a cold steam vaporizer or humidifier in your bedroom or your home to help loosen secretions.  Avoid anything that causes you to cough at work or at home.  If your cough is worse at night, try sleeping in a semi-upright position.  Avoid cigarette smoke. If you smoke, quit smoking. If you need help quitting, ask your health care provider.  Avoid caffeine.  Avoid alcohol.  Rest as needed. SEEK MEDICAL CARE IF:   You have new symptoms.  You cough up pus.  Your cough does not get better after 2-3 weeks, or your cough gets worse.  You cannot control your cough with suppressant medicines and you are losing sleep.  You develop pain that is getting worse or pain that is not controlled with pain medicines.  You have a fever.  You have unexplained weight loss.  You have night sweats. SEEK  IMMEDIATE MEDICAL CARE IF:  You cough up blood.  You have difficulty breathing.  Your heartbeat is very fast.   This information is not intended to replace advice given to you by your health care provider. Make sure you discuss any questions you have with your health care provider.   Document Released: 06/26/2010 Document Revised: 09/18/2014 Document Reviewed: 03/06/2014 Elsevier Interactive Patient Education 2016 [employer].  Hemoptysis Hemoptysis, which means coughing up blood, can be a sign of a minor problem or a serious medical condition. The blood that is coughed up may come from the lungs and airways. Coughed-up blood can also come from bleeding that occurs outside the lungs and airways. Blood can drain into the windpipe during a severe nosebleed or when blood is vomited from the stomach. Because hemoptysis can be a sign of something serious, a medical evaluation is required. For some people with hemoptysis, no definite cause is ever identified. CAUSES  The most common cause of hemoptysis is bronchitis. Some other common causes include:   A ruptured blood vessel caused by coughing or an infection.   A medical condition that causes damage to the large air  passageways (bronchiectasis).   A blood clot in the lungs (pulmonary embolism).   Pneumonia.   Tuberculosis.   Breathing in a small foreign object.   Cancer. For some people with hemoptysis, no definite cause is ever identified.  HOME CARE INSTRUCTIONS  Only take over-the-counter or prescription medicines as directed by your caregiver. Do not use cough suppressants unless your caregiver approves.  If your caregiver prescribes antibiotic medicines, take them as directed. Finish them even if you start to feel better.  Do not smoke. Also avoid secondhand smoke.  Follow up with your caregiver as directed. SEEK IMMEDIATE MEDICAL CARE IF:   You cough up bloody mucus for longer than a week.  You have a  blood-producing cough that is severe or getting worse.  You have a blood-producing cough thatcomes and goes over time.  You develop problems with your breathing.   You vomit blood.  You develop bloody or black-colored stools.  You have chest pain.   You develop night sweats.  You feel faint or pass out.   You have a fever or persistent symptoms for more than 2-3 days.  You have a fever and your symptoms suddenly get worse. MAKE SURE YOU:  Understand these instructions.  Will watch your condition.  Will get help right away if you are not doing well or get worse.   This information is not intended to replace advice given to you by your health care provider. Make sure you discuss any questions you have with your health care provider.   Document Released: 03/08/2001 Document Revised: 12/15/2011 Document Reviewed: 10/15/2011 Elsevier Interactive Patient Education [employer]

## 2014-12-11 NOTE — ED Notes (Signed)
Pt presents with c/o chest pain that started yesterday. Pt reports the pain is in the left part of his chest, radiating to the left side as well. Pt reports no shortness of breath or dizziness. Pt reports he was also coughing up blood last Thursday and yesterday.

## 2014-12-11 NOTE — ED Notes (Signed)
Pt ambulatory to [facility] with steady gait, work note provided. Pt denies pain at this time and verbalizes understanding of d/c instructions

## 2015-01-16 ENCOUNTER — Emergency Department (HOSPITAL_COMMUNITY)
Admission: EM | Admit: 2015-01-16 | Discharge: 2015-01-16 | Disposition: A | Discharge status: Home / Self Care | Payer: Managed Care, Other (non HMO) | Attending: Emergency Medicine | Admitting: Emergency Medicine

## 2015-01-16 ENCOUNTER — Encounter (HOSPITAL_COMMUNITY): Payer: Self-pay | Admitting: Emergency Medicine

## 2015-01-16 DIAGNOSIS — R1011 Right upper quadrant pain: Secondary | ICD-10-CM | POA: Insufficient documentation

## 2015-01-16 DIAGNOSIS — J45909 Unspecified asthma, uncomplicated: Secondary | ICD-10-CM | POA: Insufficient documentation

## 2015-01-16 DIAGNOSIS — R0989 Other specified symptoms and signs involving the circulatory and respiratory systems: Secondary | ICD-10-CM | POA: Insufficient documentation

## 2015-01-16 DIAGNOSIS — Z79899 Other long term (current) drug therapy: Secondary | ICD-10-CM | POA: Insufficient documentation

## 2015-01-16 DIAGNOSIS — J3489 Other specified disorders of nose and nasal sinuses: Secondary | ICD-10-CM | POA: Insufficient documentation

## 2015-01-16 DIAGNOSIS — Z8679 Personal history of other diseases of the circulatory system: Secondary | ICD-10-CM | POA: Insufficient documentation

## 2015-01-16 DIAGNOSIS — F419 Anxiety disorder, unspecified: Secondary | ICD-10-CM | POA: Insufficient documentation

## 2015-01-16 DIAGNOSIS — Z87891 Personal history of nicotine dependence: Secondary | ICD-10-CM | POA: Insufficient documentation

## 2015-01-16 DIAGNOSIS — R067 Sneezing: Secondary | ICD-10-CM | POA: Insufficient documentation

## 2015-01-16 DIAGNOSIS — F329 Major depressive disorder, single episode, unspecified: Secondary | ICD-10-CM | POA: Insufficient documentation

## 2015-01-16 DIAGNOSIS — R112 Nausea with vomiting, unspecified: Secondary | ICD-10-CM | POA: Insufficient documentation

## 2015-01-16 DIAGNOSIS — R1012 Left upper quadrant pain: Secondary | ICD-10-CM | POA: Insufficient documentation

## 2015-01-16 DIAGNOSIS — R05 Cough: Secondary | ICD-10-CM | POA: Insufficient documentation

## 2015-01-16 MED ORDER — ONDANSETRON 4 MG PO TBDP: 4.0000 mg | ORAL_TABLET | Freq: Three times a day (TID) | ORAL | Status: DC | PRN

## 2015-01-16 MED ORDER — ONDANSETRON 4 MG PO TBDP
4.0000 mg | ORAL_TABLET | Freq: Once | ORAL | Status: AC
  Administered 2015-01-16: 4 mg via ORAL
  Filled 2015-01-16: qty 1

## 2015-01-16 NOTE — ED Notes (Signed)
Per pt, states congestion and vomited once this am

## 2015-01-16 NOTE — ED Provider Notes (Signed)
CSN: [id number]     Arrival date & time 01/16/15  1217 History  By signing my name below, I, Lyndel SafeKaitlyn Shelton, attest that this documentation has been prepared under the direction and in the presence of  Everlene FarrierWilliam Nevaan Bunton PA-C.  Electronically Signed: Lyndel SafeKaitlyn Shelton, ED Scribe. 01/16/2015. 2:15 PM.   Chief Complaint  Patient presents with  . congestion/vomiting    The history is provided by the patient. No language interpreter was used.   HPI Comments: Warren Mcbride is a 20 y.o. male, with a PMhx of asthma, who presents to the Emergency Department complaining of gradually worsening, constant, 6/10,  bilateral upper abdominal pain onset 1 day ago with associated nausea and 3 episodes of emesis today. Pt has been able to tolerate PO food and fluids and fritos today without difficulty or emesis following, and he denies current nausea. He is requesting a work note. The pt also c/o a mild, non-productive cough, chest congestion, sneezing and rhinorrhea from a few days ago, but reports this is resolving and he did not come to the ED for that. Pt has an albuterol inhaler at home but notes he has not needed to use his inhaler. He denies fevers or chills, back pain, wheezing, any urinary symptoms, SOB, light-headedness, dizziness, hematemesis, or diarrhea. He denies previous abdominal surgeries.  Past Medical History  Diagnosis Date  . Asthma   . Anxiety   . ADHD (attention deficit hyperactivity disorder)   . Depression   . 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 Johney FrameAllred - no PPM indicated at the time; AV block due to increased vagal tone    Past Surgical History  Procedure Laterality Date  . No past surgeries     No family history on file. Social History  Substance Use Topics  . Smoking status: Former Smoker -- 0.25 packs/day for 4 years    Types: Cigarettes  . Smokeless tobacco: Never Used  . Alcohol Use: No    Review of Systems  Constitutional: Negative for fever and chills.   HENT: Positive for congestion ( chest), rhinorrhea and sneezing.   Respiratory: Positive for cough. Negative for shortness of breath and wheezing.   Gastrointestinal: Positive for nausea, vomiting and abdominal pain. Negative for diarrhea and blood in stool.  Genitourinary: Negative for dysuria, urgency, frequency, hematuria, flank pain, decreased urine volume and difficulty urinating.  Musculoskeletal: Negative for myalgias and back pain.  Skin: Negative for rash.  Neurological: Negative for dizziness, light-headedness and numbness.   Allergies  Review of patient's allergies indicates no known allergies.  Home Medications   Prior to Admission medications   Medication Sig Start Date End Date Taking? Authorizing Provider  amphetamine-dextroamphetamine (ADDERALL XR) 30 MG 24 hr capsule Take 30 mg by mouth 2 (two) times daily.    Historical Provider, MD  ipratropium-albuterol (DUONEB) 0.5-2.5 (3) MG/3ML SOLN Take 3 mLs by nebulization every 2 (two) hours as needed. 12/11/14   Stevi Barrett, PA-C  lamoTRIgine (LAMICTAL) 150 MG tablet Take 150 mg by mouth 2 (two) times daily.    Historical Provider, MD  ondansetron (ZOFRAN ODT) 4 MG disintegrating tablet Take 1 tablet (4 mg total) by mouth every 8 (eight) hours as needed for nausea or vomiting. 01/16/15   Everlene FarrierWilliam Liesa Tsan, PA-C  traZODone (DESYREL) 50 MG tablet Take 50 mg by mouth at bedtime as needed for sleep.  11/26/14   Historical Provider, MD   BP 106/71 mmHg  Pulse 67  Temp(Src) 97.7  F (36.5 C) (Oral)  Resp 16  SpO2 100% Physical Exam  Constitutional: He is oriented to person, place, and time. He appears well-developed and well-nourished. No distress.  Non toxic appearing.   HENT:  Head: Normocephalic and atraumatic.  Right Ear: External ear normal.  Left Ear: External ear normal.  Mouth/Throat: Oropharynx is clear and moist.  Eyes: Conjunctivae are normal. Pupils are equal, round, and reactive to light. Right eye exhibits no  discharge. Left eye exhibits no discharge.  Neck: Normal range of motion. Neck supple. No JVD present. No tracheal deviation present.  Cardiovascular: Normal rate, regular rhythm, normal heart sounds and intact distal pulses.  Exam reveals no gallop and no friction rub.   No murmur heard. Pulmonary/Chest: Effort normal and breath sounds normal. No respiratory distress. He has no wheezes. He has no rales.  Lungs clear to auscultation bilaterally.  Abdominal: Soft. Bowel sounds are normal. He exhibits no distension and no mass. There is tenderness. There is no rebound and no guarding.  Abdomen is soft. Bowel sounds are present. Patient has mild left upper quadrant abdominal tenderness to palpation. No peritoneal signs. No CVA or flank tenderness.  Musculoskeletal: He exhibits no edema or tenderness.  Lymphadenopathy:    He has no cervical adenopathy.  Neurological: He is alert and oriented to person, place, and time. Coordination normal.  Skin: Skin is warm and dry. No rash noted. He is not diaphoretic. No erythema. No pallor.  Psychiatric: He has a normal mood and affect. His behavior is normal.  Nursing note and vitals reviewed.   ED Course  Procedures  DIAGNOSTIC STUDIES: Oxygen Saturation is 100% on RA, normal by my interpretation.    COORDINATION OF CARE: 2:12 PM Discussed treatment plan which includes to order and prescribe Zofran with pt. Will provide pt with a work note. Pt acknowledges and agrees to plan.     MDM   Meds given in ED:  Medications  ondansetron (ZOFRAN-ODT) disintegrating tablet 4 mg (4 mg Oral Given 01/16/15 1420)    New Prescriptions   ONDANSETRON (ZOFRAN ODT) 4 MG DISINTEGRATING TABLET    Take 1 tablet (4 mg total) by mouth every 8 (eight) hours as needed for nausea or vomiting.    Final diagnoses:  Non-intractable vomiting with nausea, vomiting of unspecified type  Left upper quadrant pain   This  is a 20 y.o. male, with a PMhx of asthma, who presents  to the Emergency Department complaining of gradually worsening, constant, 6/10,  bilateral upper abdominal pain onset 1 day ago with associated nausea and 3 episodes of emesis today. Pt has been able to tolerate PO food and fluids and fritos today without difficulty or emesis following, and he denies current nausea. He is requesting a work note On exam the patient is afebrile nontoxic appearing. His abdomen is soft and has mild left upper quadrant tenderness palpation. No peritoneal signs. Lungs are clear to auscultation bilaterally. Patient reports he is just eaten Fritos without nausea or vomiting. I see no need for further workup of the distal time. We'll discharge the patient with prescription for Zofran. I discussed strict return precautions and encouraged to follow-up with his primary care provider. I advised the patient to follow-up with their primary care provider this week. I advised the patient to return to the emergency department with new or worsening symptoms or new concerns. The patient verbalized understanding and agreement with plan.    This patient was discussed with Dr. Patria Mane who agrees with  assessment and plan.  I personally performed the services described in this documentation, which was scribed in my presence. The recorded information has been reviewed and is accurate.       Everlene Farrier, PA-C 01/16/15 1437  Azalia Bilis, MD 01/16/15 [phone]

## 2015-01-16 NOTE — Discharge Instructions (Signed)
Abdominal Pain, Adult °Many things can cause abdominal pain. Usually, abdominal pain is not caused by a disease and will improve without treatment. It can often be observed and treated at home. Your health care provider will do a physical exam and possibly order blood tests and X-rays to help determine the seriousness of your pain. However, in many cases, more time must pass before a clear cause of the pain can be found. Before that point, your health care provider may not know if you need more testing or further treatment. °HOME CARE INSTRUCTIONS °Monitor your abdominal pain for any changes. The following actions may help to alleviate any discomfort you are experiencing: °· Only take over-the-counter or prescription medicines as directed by your health care provider. °· Do not take laxatives unless directed to do so by your health care provider. °· Try a clear liquid diet (broth, tea, or water) as directed by your health care provider. Slowly move to a bland diet as tolerated. °SEEK MEDICAL CARE IF: °· You have unexplained abdominal pain. °· You have abdominal pain associated with nausea or diarrhea. °· You have pain when you urinate or have a bowel movement. °· You experience abdominal pain that wakes you in the night. °· You have abdominal pain that is worsened or improved by eating food. °· You have abdominal pain that is worsened with eating fatty foods. °· You have a fever. °SEEK IMMEDIATE MEDICAL CARE IF: °· Your pain does not go away within 2 hours. °· You keep throwing up (vomiting). °· Your pain is felt only in portions of the abdomen, such as the right side or the left lower portion of the abdomen. °· You pass bloody or black tarry stools. °MAKE SURE YOU: °· Understand these instructions. °· Will watch your condition. °· Will get help right away if you are not doing well or get worse. °  °This information is not intended to replace advice given to you by your health care provider. Make sure you discuss  any questions you have with your health care provider. °  °Document Released: 10/07/2004 Document Revised: 09/18/2014 Document Reviewed: 09/06/2012 °Elsevier Interactive Patient Education ©2016 [employer] ° °Nausea and Vomiting °Nausea is a sick feeling that often comes before throwing up (vomiting). Vomiting is a reflex where stomach contents come out of your mouth. Vomiting can cause severe loss of body fluids (dehydration). Children and elderly adults can become dehydrated quickly, especially if they also have diarrhea. Nausea and vomiting are symptoms of a condition or disease. It is important to find the cause of your symptoms. °CAUSES  °· Direct irritation of the stomach lining. This irritation can result from increased acid production (gastroesophageal reflux disease), infection, food poisoning, taking certain medicines (such as nonsteroidal anti-inflammatory drugs), alcohol use, or tobacco use. °· Signals from the brain. These signals could be caused by a headache, heat exposure, an inner ear disturbance, increased pressure in the brain from injury, infection, a tumor, or a concussion, pain, emotional stimulus, or metabolic problems. °· An obstruction in the gastrointestinal tract (bowel obstruction). °· Illnesses such as diabetes, hepatitis, gallbladder problems, appendicitis, kidney problems, cancer, sepsis, atypical symptoms of a heart attack, or eating disorders. °· Medical treatments such as chemotherapy and radiation. °· Receiving medicine that makes you sleep (general anesthetic) during surgery. °DIAGNOSIS °Your caregiver may ask for tests to be done if the problems do not improve after a few days. Tests may also be done if symptoms are severe or if the reason for the   nausea and vomiting is not clear. Tests may include: °· Urine tests. °· Blood tests. °· Stool tests. °· Cultures (to look for evidence of infection). °· X-rays or other imaging studies. °Test results can help your caregiver make  decisions about treatment or the need for additional tests. °TREATMENT °You need to stay well hydrated. Drink frequently but in small amounts. You may wish to drink water, sports drinks, clear broth, or eat frozen ice pops or gelatin dessert to help stay hydrated. When you eat, eating slowly may help prevent nausea. There are also some antinausea medicines that may help prevent nausea. °HOME CARE INSTRUCTIONS  °· Take all medicine as directed by your caregiver. °· If you do not have an appetite, do not force yourself to eat. However, you must continue to drink fluids. °· If you have an appetite, eat a normal diet unless your caregiver tells you differently. °¨ Eat a variety of complex carbohydrates (rice, wheat, potatoes, bread), lean meats, yogurt, fruits, and vegetables. °¨ Avoid high-fat foods because they are more difficult to digest. °· Drink enough water and fluids to keep your urine clear or pale yellow. °· If you are dehydrated, ask your caregiver for specific rehydration instructions. Signs of dehydration may include: °¨ Severe thirst. °¨ Dry lips and mouth. °¨ Dizziness. °¨ Dark urine. °¨ Decreasing urine frequency and amount. °¨ Confusion. °¨ Rapid breathing or pulse. °SEEK IMMEDIATE MEDICAL CARE IF:  °· You have blood or brown flecks (like coffee grounds) in your vomit. °· You have black or bloody stools. °· You have a severe headache or stiff neck. °· You are confused. °· You have severe abdominal pain. °· You have chest pain or trouble breathing. °· You do not urinate at least once every 8 hours. °· You develop cold or clammy skin. °· You continue to vomit for longer than 24 to 48 hours. °· You have a fever. °MAKE SURE YOU:  °· Understand these instructions. °· Will watch your condition. °· Will get help right away if you are not doing well or get worse. °  °This information is not intended to replace advice given to you by your health care provider. Make sure you discuss any questions you have with  your health care provider. °  °Document Released: 12/28/2004 Document Revised: 03/22/2011 Document Reviewed: 05/27/2010 °Elsevier Interactive Patient Education ©2016 [employer] ° °

## 2015-02-20 ENCOUNTER — Encounter (HOSPITAL_COMMUNITY): Payer: Self-pay | Admitting: Emergency Medicine

## 2015-02-20 ENCOUNTER — Emergency Department (HOSPITAL_COMMUNITY): Payer: Managed Care, Other (non HMO)

## 2015-02-20 ENCOUNTER — Emergency Department (HOSPITAL_COMMUNITY)
Admission: EM | Admit: 2015-02-20 | Discharge: 2015-02-20 | Disposition: A | Discharge status: Home / Self Care | Payer: Managed Care, Other (non HMO) | Attending: Emergency Medicine | Admitting: Emergency Medicine

## 2015-02-20 DIAGNOSIS — M545 Low back pain, unspecified: Secondary | ICD-10-CM

## 2015-02-20 DIAGNOSIS — S199XXA Unspecified injury of neck, initial encounter: Secondary | ICD-10-CM | POA: Insufficient documentation

## 2015-02-20 DIAGNOSIS — S8991XA Unspecified injury of right lower leg, initial encounter: Secondary | ICD-10-CM | POA: Insufficient documentation

## 2015-02-20 DIAGNOSIS — S0990XA Unspecified injury of head, initial encounter: Secondary | ICD-10-CM | POA: Insufficient documentation

## 2015-02-20 DIAGNOSIS — Z79899 Other long term (current) drug therapy: Secondary | ICD-10-CM | POA: Insufficient documentation

## 2015-02-20 DIAGNOSIS — S29001A Unspecified injury of muscle and tendon of front wall of thorax, initial encounter: Secondary | ICD-10-CM | POA: Insufficient documentation

## 2015-02-20 DIAGNOSIS — Z8679 Personal history of other diseases of the circulatory system: Secondary | ICD-10-CM | POA: Insufficient documentation

## 2015-02-20 DIAGNOSIS — M25561 Pain in right knee: Secondary | ICD-10-CM

## 2015-02-20 DIAGNOSIS — F329 Major depressive disorder, single episode, unspecified: Secondary | ICD-10-CM | POA: Insufficient documentation

## 2015-02-20 DIAGNOSIS — F17210 Nicotine dependence, cigarettes, uncomplicated: Secondary | ICD-10-CM | POA: Insufficient documentation

## 2015-02-20 DIAGNOSIS — J45909 Unspecified asthma, uncomplicated: Secondary | ICD-10-CM | POA: Insufficient documentation

## 2015-02-20 DIAGNOSIS — M542 Cervicalgia: Secondary | ICD-10-CM

## 2015-02-20 DIAGNOSIS — F419 Anxiety disorder, unspecified: Secondary | ICD-10-CM | POA: Insufficient documentation

## 2015-02-20 DIAGNOSIS — F1310 Sedative, hypnotic or anxiolytic abuse, uncomplicated: Secondary | ICD-10-CM | POA: Insufficient documentation

## 2015-02-20 DIAGNOSIS — Y92410 Unspecified street and highway as the place of occurrence of the external cause: Secondary | ICD-10-CM | POA: Insufficient documentation

## 2015-02-20 DIAGNOSIS — Y9389 Activity, other specified: Secondary | ICD-10-CM | POA: Insufficient documentation

## 2015-02-20 DIAGNOSIS — S3992XA Unspecified injury of lower back, initial encounter: Secondary | ICD-10-CM | POA: Insufficient documentation

## 2015-02-20 DIAGNOSIS — Y998 Other external cause status: Secondary | ICD-10-CM | POA: Insufficient documentation

## 2015-02-20 LAB — URINALYSIS, ROUTINE W REFLEX MICROSCOPIC
BILIRUBIN URINE: NEGATIVE
Glucose, UA: NEGATIVE mg/dL
Hgb urine dipstick: NEGATIVE
Ketones, ur: NEGATIVE mg/dL
LEUKOCYTES UA: NEGATIVE
NITRITE: NEGATIVE
PH: 6 (ref 5.0–8.0)
Protein, ur: NEGATIVE mg/dL
SPECIFIC GRAVITY, URINE: 1.018 (ref 1.005–1.030)

## 2015-02-20 LAB — RAPID URINE DRUG SCREEN, HOSP PERFORMED
AMPHETAMINES: NOT DETECTED
BARBITURATES: NOT DETECTED
BENZODIAZEPINES: POSITIVE — AB
COCAINE: NOT DETECTED
OPIATES: NOT DETECTED
TETRAHYDROCANNABINOL: NOT DETECTED

## 2015-02-20 MED ORDER — IBUPROFEN 800 MG PO TABS: 800.0000 mg | ORAL_TABLET | Freq: Three times a day (TID) | ORAL | Status: DC

## 2015-02-20 MED ORDER — FENTANYL CITRATE (PF) 100 MCG/2ML IJ SOLN
50.0000 ug | Freq: Once | INTRAMUSCULAR | Status: DC
  Filled 2015-02-20: qty 2

## 2015-02-20 MED ORDER — SODIUM CHLORIDE 0.9 % IV BOLUS (SEPSIS): 500.0000 mL | Freq: Once | INTRAVENOUS | Status: DC

## 2015-02-20 MED ORDER — CYCLOBENZAPRINE HCL 10 MG PO TABS: 10.0000 mg | ORAL_TABLET | Freq: Two times a day (BID) | ORAL | Status: DC | PRN

## 2015-02-20 MED ORDER — HYDROCODONE-ACETAMINOPHEN 5-325 MG PO TABS
1.0000 | ORAL_TABLET | Freq: Four times a day (QID) | ORAL | Status: DC | PRN
Start: 2015-02-20 — End: 2015-05-09

## 2015-02-20 NOTE — ED Notes (Signed)
Pt in radiology 

## 2015-02-20 NOTE — ED Notes (Signed)
Lab delay - pt in CT/Xray

## 2015-02-20 NOTE — ED Notes (Signed)
Pt given urinal and aware urine sample is needed. 

## 2015-02-20 NOTE — ED Notes (Signed)
Bed: WA09 Expected date:  Expected time:  Means of arrival:  Comments: Room 27 
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
## 2015-02-20 NOTE — ED Notes (Signed)
Pt c/o low back pain, neck pain l/knee pain. Pt is awake., oriented and cooperative. Spine board removed by PA and RNs per protocol. C-collar in place. Pt is cooperative and appropriate with [employer] at bedside. Mother waiting to see pt.

## 2015-02-20 NOTE — ED Notes (Signed)
Pt was transported to CT. All personal belongings given to mother. Pt remains alert and coopperative
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
## 2015-02-21 ENCOUNTER — Emergency Department (HOSPITAL_COMMUNITY)
Admission: EM | Admit: 2015-02-21 | Discharge: 2015-02-21 | Disposition: A | Discharge status: Home / Self Care | Payer: Managed Care, Other (non HMO) | Attending: Emergency Medicine | Admitting: Emergency Medicine

## 2015-02-21 ENCOUNTER — Encounter (HOSPITAL_COMMUNITY): Payer: Self-pay | Admitting: Emergency Medicine

## 2015-02-21 DIAGNOSIS — Y9389 Activity, other specified: Secondary | ICD-10-CM | POA: Insufficient documentation

## 2015-02-21 DIAGNOSIS — F909 Attention-deficit hyperactivity disorder, unspecified type: Secondary | ICD-10-CM | POA: Insufficient documentation

## 2015-02-21 DIAGNOSIS — Y92410 Unspecified street and highway as the place of occurrence of the external cause: Secondary | ICD-10-CM | POA: Insufficient documentation

## 2015-02-21 DIAGNOSIS — M25561 Pain in right knee: Secondary | ICD-10-CM

## 2015-02-21 DIAGNOSIS — Z791 Long term (current) use of non-steroidal anti-inflammatories (NSAID): Secondary | ICD-10-CM | POA: Insufficient documentation

## 2015-02-21 DIAGNOSIS — F17210 Nicotine dependence, cigarettes, uncomplicated: Secondary | ICD-10-CM | POA: Insufficient documentation

## 2015-02-21 DIAGNOSIS — Z8679 Personal history of other diseases of the circulatory system: Secondary | ICD-10-CM | POA: Insufficient documentation

## 2015-02-21 DIAGNOSIS — Y998 Other external cause status: Secondary | ICD-10-CM | POA: Insufficient documentation

## 2015-02-21 DIAGNOSIS — S3993XA Unspecified injury of pelvis, initial encounter: Secondary | ICD-10-CM | POA: Insufficient documentation

## 2015-02-21 DIAGNOSIS — F329 Major depressive disorder, single episode, unspecified: Secondary | ICD-10-CM | POA: Insufficient documentation

## 2015-02-21 DIAGNOSIS — M791 Myalgia, unspecified site: Secondary | ICD-10-CM

## 2015-02-21 DIAGNOSIS — J45909 Unspecified asthma, uncomplicated: Secondary | ICD-10-CM | POA: Insufficient documentation

## 2015-02-21 DIAGNOSIS — S8991XA Unspecified injury of right lower leg, initial encounter: Secondary | ICD-10-CM | POA: Insufficient documentation

## 2015-02-21 DIAGNOSIS — F419 Anxiety disorder, unspecified: Secondary | ICD-10-CM | POA: Insufficient documentation

## 2015-02-21 DIAGNOSIS — Z79899 Other long term (current) drug therapy: Secondary | ICD-10-CM | POA: Insufficient documentation

## 2015-02-21 MED ORDER — OXYCODONE-ACETAMINOPHEN 5-325 MG PO TABS
1.0000 | ORAL_TABLET | Freq: Once | ORAL | Status: AC
  Administered 2015-02-21: 1 via ORAL
  Filled 2015-02-21: qty 1
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
## 2015-02-23 ENCOUNTER — Emergency Department (HOSPITAL_COMMUNITY)
Admission: EM | Admit: 2015-02-23 | Discharge: 2015-02-23 | Disposition: A | Discharge status: Home / Self Care | Payer: Managed Care, Other (non HMO) | Attending: Emergency Medicine | Admitting: Emergency Medicine

## 2015-02-23 ENCOUNTER — Encounter (HOSPITAL_COMMUNITY): Payer: Self-pay

## 2015-02-23 DIAGNOSIS — G44309 Post-traumatic headache, unspecified, not intractable: Secondary | ICD-10-CM | POA: Insufficient documentation

## 2015-02-23 DIAGNOSIS — M25561 Pain in right knee: Secondary | ICD-10-CM

## 2015-02-23 DIAGNOSIS — M542 Cervicalgia: Secondary | ICD-10-CM

## 2015-02-23 DIAGNOSIS — F0781 Postconcussional syndrome: Secondary | ICD-10-CM

## 2015-02-23 DIAGNOSIS — F909 Attention-deficit hyperactivity disorder, unspecified type: Secondary | ICD-10-CM | POA: Insufficient documentation

## 2015-02-23 DIAGNOSIS — F329 Major depressive disorder, single episode, unspecified: Secondary | ICD-10-CM | POA: Insufficient documentation

## 2015-02-23 DIAGNOSIS — Z79899 Other long term (current) drug therapy: Secondary | ICD-10-CM | POA: Insufficient documentation

## 2015-02-23 DIAGNOSIS — J45909 Unspecified asthma, uncomplicated: Secondary | ICD-10-CM | POA: Insufficient documentation

## 2015-02-23 DIAGNOSIS — Z8679 Personal history of other diseases of the circulatory system: Secondary | ICD-10-CM | POA: Insufficient documentation

## 2015-02-23 DIAGNOSIS — F419 Anxiety disorder, unspecified: Secondary | ICD-10-CM | POA: Insufficient documentation

## 2015-02-23 DIAGNOSIS — F17210 Nicotine dependence, cigarettes, uncomplicated: Secondary | ICD-10-CM | POA: Insufficient documentation
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
## 2015-02-23 NOTE — Discharge Instructions (Signed)
Follow up with neurology clinic listed.  Return to ER for new or worsening symptoms, any additional concerns.

## 2015-05-06 ENCOUNTER — Inpatient Hospital Stay (HOSPITAL_COMMUNITY)
Admission: EM | Admit: 2015-05-06 | Discharge: 2015-05-09 | DRG: 917 | Disposition: A | Discharge status: Other Acute Inpatient Hospital | Payer: 59 | Attending: Internal Medicine | Admitting: Internal Medicine

## 2015-05-06 DIAGNOSIS — I440 Atrioventricular block, first degree: Secondary | ICD-10-CM | POA: Diagnosis present

## 2015-05-06 DIAGNOSIS — Z8709 Personal history of other diseases of the respiratory system: Secondary | ICD-10-CM

## 2015-05-06 DIAGNOSIS — F3130 Bipolar disorder, current episode depressed, mild or moderate severity, unspecified: Secondary | ICD-10-CM | POA: Diagnosis present

## 2015-05-06 DIAGNOSIS — R001 Bradycardia, unspecified: Secondary | ICD-10-CM | POA: Diagnosis present

## 2015-05-06 DIAGNOSIS — F1120 Opioid dependence, uncomplicated: Secondary | ICD-10-CM | POA: Diagnosis present

## 2015-05-06 DIAGNOSIS — F1210 Cannabis abuse, uncomplicated: Secondary | ICD-10-CM | POA: Diagnosis present

## 2015-05-06 DIAGNOSIS — R0682 Tachypnea, not elsewhere classified: Secondary | ICD-10-CM | POA: Diagnosis present

## 2015-05-06 DIAGNOSIS — I441 Atrioventricular block, second degree: Secondary | ICD-10-CM | POA: Diagnosis present

## 2015-05-06 DIAGNOSIS — J45909 Unspecified asthma, uncomplicated: Secondary | ICD-10-CM | POA: Diagnosis present

## 2015-05-06 DIAGNOSIS — T424X2A Poisoning by benzodiazepines, intentional self-harm, initial encounter: Secondary | ICD-10-CM | POA: Diagnosis not present

## 2015-05-06 DIAGNOSIS — F1310 Sedative, hypnotic or anxiolytic abuse, uncomplicated: Secondary | ICD-10-CM | POA: Diagnosis present

## 2015-05-06 DIAGNOSIS — I469 Cardiac arrest, cause unspecified: Secondary | ICD-10-CM | POA: Diagnosis present

## 2015-05-06 DIAGNOSIS — F419 Anxiety disorder, unspecified: Secondary | ICD-10-CM | POA: Diagnosis present

## 2015-05-06 DIAGNOSIS — H538 Other visual disturbances: Secondary | ICD-10-CM | POA: Diagnosis present

## 2015-05-06 DIAGNOSIS — F319 Bipolar disorder, unspecified: Secondary | ICD-10-CM | POA: Diagnosis present

## 2015-05-06 DIAGNOSIS — Z79899 Other long term (current) drug therapy: Secondary | ICD-10-CM

## 2015-05-06 DIAGNOSIS — R400 Somnolence: Secondary | ICD-10-CM | POA: Diagnosis present

## 2015-05-06 DIAGNOSIS — T50901A Poisoning by unspecified drugs, medicaments and biological substances, accidental (unintentional), initial encounter: Secondary | ICD-10-CM | POA: Diagnosis present

## 2015-05-06 DIAGNOSIS — F909 Attention-deficit hyperactivity disorder, unspecified type: Secondary | ICD-10-CM | POA: Diagnosis present

## 2015-05-06 DIAGNOSIS — T424X1A Poisoning by benzodiazepines, accidental (unintentional), initial encounter: Secondary | ICD-10-CM | POA: Insufficient documentation

## 2015-05-06 DIAGNOSIS — F1110 Opioid abuse, uncomplicated: Secondary | ICD-10-CM | POA: Diagnosis present

## 2015-05-06 DIAGNOSIS — F17210 Nicotine dependence, cigarettes, uncomplicated: Secondary | ICD-10-CM | POA: Diagnosis present

## 2015-05-06 DIAGNOSIS — I455 Other specified heart block: Secondary | ICD-10-CM | POA: Insufficient documentation

## 2015-05-06 DIAGNOSIS — R5383 Other fatigue: Secondary | ICD-10-CM | POA: Diagnosis present

## 2015-05-06 DIAGNOSIS — F1920 Other psychoactive substance dependence, uncomplicated: Secondary | ICD-10-CM

## 2015-05-06 DIAGNOSIS — F1510 Other stimulant abuse, uncomplicated: Secondary | ICD-10-CM | POA: Diagnosis present

## 2015-05-06 LAB — COMPREHENSIVE METABOLIC PANEL
ALK PHOS: 74 U/L (ref 38–126)
ALT: 40 U/L (ref 17–63)
ANION GAP: 9 (ref 5–15)
AST: 33 U/L (ref 15–41)
Albumin: 4.2 g/dL (ref 3.5–5.0)
BUN: 10 mg/dL (ref 6–20)
CALCIUM: 9.5 mg/dL (ref 8.9–10.3)
CO2: 24 mmol/L (ref 22–32)
CREATININE: 1.13 mg/dL (ref 0.61–1.24)
Chloride: 107 mmol/L (ref 101–111)
Glucose, Bld: 95 mg/dL (ref 65–99)
Potassium: 3.8 mmol/L (ref 3.5–5.1)
SODIUM: 140 mmol/L (ref 135–145)
TOTAL PROTEIN: 7.4 g/dL (ref 6.5–8.1)
Total Bilirubin: 0.5 mg/dL (ref 0.3–1.2)

## 2015-05-06 LAB — URINALYSIS, ROUTINE W REFLEX MICROSCOPIC
Glucose, UA: NEGATIVE mg/dL
Hgb urine dipstick: NEGATIVE
Ketones, ur: NEGATIVE mg/dL
NITRITE: NEGATIVE
PROTEIN: NEGATIVE mg/dL
SPECIFIC GRAVITY, URINE: 1.029 (ref 1.005–1.030)
pH: 6 (ref 5.0–8.0)

## 2015-05-06 LAB — URINE MICROSCOPIC-ADD ON
RBC / HPF: NONE SEEN RBC/hpf (ref 0–5)
SQUAMOUS EPITHELIAL / LPF: NONE SEEN

## 2015-05-06 LAB — CBC WITH DIFFERENTIAL/PLATELET
BASOS PCT: 0 %
Basophils Absolute: 0 10*3/uL (ref 0.0–0.1)
Eosinophils Absolute: 0.1 10*3/uL (ref 0.0–0.7)
Eosinophils Relative: 1 %
HEMATOCRIT: 41.8 % (ref 39.0–52.0)
HEMOGLOBIN: 14.2 g/dL (ref 13.0–17.0)
LYMPHS ABS: 2.8 10*3/uL (ref 0.7–4.0)
LYMPHS PCT: 40 %
MCH: 28.6 pg (ref 26.0–34.0)
MCHC: 34 g/dL (ref 30.0–36.0)
MCV: 84.1 fL (ref 78.0–100.0)
MONO ABS: 0.5 10*3/uL (ref 0.1–1.0)
MONOS PCT: 8 %
NEUTROS ABS: 3.5 10*3/uL (ref 1.7–7.7)
NEUTROS PCT: 51 %
Platelets: 175 10*3/uL (ref 150–400)
RBC: 4.97 MIL/uL (ref 4.22–5.81)
RDW: 13.9 % (ref 11.5–15.5)
WBC: 6.8 10*3/uL (ref 4.0–10.5)

## 2015-05-06 LAB — ETHANOL: Alcohol, Ethyl (B): <5 mg/dL (ref <5)

## 2015-05-06 LAB — SALICYLATE LEVEL

## 2015-05-06 LAB — ACETAMINOPHEN LEVEL: Acetaminophen (Tylenol), Serum: <10 ug/mL — ABNORMAL LOW (ref 10–30)

## 2015-05-06 MED ORDER — SODIUM CHLORIDE 0.9 % IV BOLUS (SEPSIS)
1000.0000 mL | Freq: Once | INTRAVENOUS | Status: AC
  Administered 2015-05-06: 1000 mL via INTRAVENOUS

## 2015-05-06 NOTE — ED Provider Notes (Signed)
CSN: [id number]     Arrival date & time 05/06/15  2041 History   First MD Initiated Contact with Patient 05/06/15 2100     Chief Complaint  Patient presents with  . Ingestion     Patient is a 20 y.o. male presenting with Ingested Medication. The history is provided by the patient and the EMS personnel. No language interpreter was used.  Ingestion   Warren Mcbride is a 20 y.o. male who presents to the Emergency Department complaining of overdose.  He reports suicidal ideation and took Xanax in the suicide attempt today. It is unclear who called 911. He states he took 5, 2 mg Xanax around 5 PM and he took some last night. He denies any additional ingestions. He has ongoing SI in the department.  Past Medical History  Diagnosis Date  . Asthma   . Anxiety   . ADHD (attention deficit hyperactivity disorder)   . Depression   . 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 Johney Frame - no PPM indicated at the time; AV block due to increased vagal tone    Past Surgical History  Procedure Laterality Date  . No past surgeries     No family history on file. Social History  Substance Use Topics  . Smoking status: Current Every Day Smoker -- 0.25 packs/day for 4 years    Types: Cigarettes  . Smokeless tobacco: Never Used  . Alcohol Use: No    Review of Systems  All other systems reviewed and are negative.     Allergies  Review of patient's allergies indicates no known allergies.  Home Medications   Prior to Admission medications   Medication Sig Start Date End Date Taking? Authorizing Provider  alprazolam Prudy Feeler) 2 MG tablet Take 8 mg by mouth 2 (two) times daily.   Yes Historical Provider, MD  lamoTRIgine (LAMICTAL) 100 MG tablet Take 150 mg by mouth 2 (two) times daily.   Yes Historical Provider, MD  amphetamine-dextroamphetamine (ADDERALL XR) 30 MG 24 hr capsule Take 30 mg by mouth 2 (two) times daily.    Historical Provider, MD  cyclobenzaprine (FLEXERIL) 10 MG tablet  Take 1 tablet (10 mg total) by mouth 2 (two) times daily as needed for muscle spasms. 02/20/15   Danelle Berry, PA-C  HYDROcodone-acetaminophen (NORCO/VICODIN) 5-325 MG tablet Take 1-2 tablets by mouth every 6 (six) hours as needed. Patient taking differently: Take 1-2 tablets by mouth every 6 (six) hours as needed for moderate pain.  02/20/15   Danelle Berry, PA-C  ibuprofen (ADVIL,MOTRIN) 800 MG tablet Take 1 tablet (800 mg total) by mouth 3 (three) times daily. Patient taking differently: Take 800 mg by mouth every 6 (six) hours as needed for headache or moderate pain.  02/20/15   Danelle Berry, PA-C  ipratropium-albuterol (DUONEB) 0.5-2.5 (3) MG/3ML SOLN Take 3 mLs by nebulization every 2 (two) hours as needed. Patient taking differently: Take 3 mLs by nebulization every 2 (two) hours as needed (wheezing, shortness of breath).  12/11/14   Stevi Barrett, PA-C  ondansetron (ZOFRAN ODT) 4 MG disintegrating tablet Take 1 tablet (4 mg total) by mouth every 8 (eight) hours as needed for nausea or vomiting. Patient not taking: Reported on 02/20/2015 01/16/15   Everlene Farrier, PA-C  traZODone (DESYREL) 50 MG tablet Take 50 mg by mouth at bedtime.  11/26/14   Historical Provider, MD   BP 109/46 mmHg  Pulse 81  Temp(Src) 97.3 F (36.3 C) (Oral)  Resp  38  SpO2 92% Physical Exam  Constitutional: He is oriented to person, place, and time. He appears well-developed and well-nourished.  HENT:  Head: Normocephalic and atraumatic.  Eyes: Pupils are equal, round, and reactive to light.  Horizontal nystagmus, conjunctival injection  Cardiovascular: Normal rate and regular rhythm.   No murmur heard. Pulmonary/Chest: Effort normal and breath sounds normal. No respiratory distress.  Abdominal: Soft. There is no tenderness. There is no rebound and no guarding.  Musculoskeletal: He exhibits no edema or tenderness.  Neurological: He is oriented to person, place, and time.  Lethargic but arousable to verbal stimuli  Skin:  Skin is warm and dry.  Psychiatric:  Flat affect  Nursing note and vitals reviewed.   ED Course  Procedures (including critical care time) Labs Review Labs Reviewed  URINALYSIS, ROUTINE W REFLEX MICROSCOPIC (NOT AT [facility]) - Abnormal; Notable for the following:    Color, Urine AMBER (*)    APPearance TURBID (*)    Bilirubin Urine SMALL (*)    Leukocytes, UA SMALL (*)    All other components within normal limits  URINE RAPID DRUG SCREEN, HOSP PERFORMED - Abnormal; Notable for the following:    Opiates POSITIVE (*)    Benzodiazepines POSITIVE (*)    Amphetamines POSITIVE (*)    Tetrahydrocannabinol POSITIVE (*)    All other components within normal limits  ACETAMINOPHEN LEVEL - Abnormal; Notable for the following:    Acetaminophen (Tylenol), Serum <10 (*)    All other components within normal limits  URINE MICROSCOPIC-ADD ON - Abnormal; Notable for the following:    Bacteria, UA MANY (*)    All other components within normal limits  BLOOD GAS, VENOUS - Abnormal; Notable for the following:    pH, Ven 7.335 (*)    pO2, Ven 68.8 (*)    Bicarbonate 24.9 (*)    All other components within normal limits  COMPREHENSIVE METABOLIC PANEL  SALICYLATE LEVEL  CBC WITH DIFFERENTIAL/PLATELET  ETHANOL    Imaging Review No results found. I have personally reviewed and evaluated these images and lab results as part of my medical decision-making.   EKG Interpretation   Date/Time:  Tuesday May 06 2015 21:10:44 EDT Ventricular Rate:  88 PR Interval:  215 QRS Duration: 95 QT Interval:  356 QTC Calculation: 431 R Axis:   70 Text Interpretation:  Sinus rhythm Prolonged PR interval ST elevation  consistent with early repolarization Confirmed by Lincoln Brighamees, Liz [phone]([id number]) on  05/06/2015 9:15:28 PM      MDM   Final diagnoses:  Overdose of benzodiazepine, intentional self-harm, initial encounter [facility](HCC)    Patient here for evaluation following a suicide attempt by overdose on Xanax. He is  lethargic but arousable on examination. Plan to monitor and observe in the emergency department.  On repeat evaluation he is persistently lethargic and difficult to arouse. Plan to admit for further observation.    Tilden FossaElizabeth Jaylen Claude, MD 05/07/15 [phone]

## 2015-05-06 NOTE — Progress Notes (Signed)
EDCM went to speak to patient at bedside, however, patient was sound asleep. . Patient listed as not having  a pcp or insurance living in [place].  EDCM provided patient with contact infromation to [facility], informed patient of services there.  EDCM also provided patient with list of pcps who accept self pay patients, list of discount pharmacies and websites needymeds.org and GoodRX.com for medication assistance, phone number to inquire about the orange card, phone number to inquire about Medicaid, phone number to inquire about the Affordable Care Act, financial resources in the community such as local churches, [employer], urban ministries, and dental assistance for uninsured patients.  .  No further EDCM needs at this time.  EDCM did not disturb this patient at this time.  EDCM left resources at bedside.  Patient with sitter. 

## 2015-05-06 NOTE — ED Notes (Signed)
Bed: [id number] Expected date:  Expected time:  Means of arrival:  Comments: 20 yo M  Suicidal ideations  9 xanax in the past 24 hrs  Room 25

## 2015-05-06 NOTE — ED Notes (Signed)
Pt has 2 iphones, a charges, a lighter, ciggerattes, 1 pair of pants, 1 pair of basketball shorts, a tee shirt, a poll over sweater, a pink sweater, and 1 set of keys in his belongings.

## 2015-05-06 NOTE — ED Notes (Signed)
Pt BIB EMS. Pt sleeping during triage. EMS report that pt states he took 5 Xanax last night, 4 this morning, and 4 this evening. Pt reports they were 2 mg each. Pt complaining of blurred vision and is lethargy upon EMS arrival. Denies pain. Endorses SI, states that this occurrence was his first attempt at suicide. Denies HI at this time.   EMS vitals 110/78, 100, CBG 100, 99%, NSR on monitor with EMS.

## 2015-05-07 ENCOUNTER — Emergency Department (HOSPITAL_COMMUNITY): Payer: 59

## 2015-05-07 ENCOUNTER — Encounter (HOSPITAL_COMMUNITY): Payer: Self-pay | Admitting: *Deleted

## 2015-05-07 DIAGNOSIS — F1510 Other stimulant abuse, uncomplicated: Secondary | ICD-10-CM | POA: Diagnosis present

## 2015-05-07 DIAGNOSIS — R0682 Tachypnea, not elsewhere classified: Secondary | ICD-10-CM | POA: Diagnosis present

## 2015-05-07 DIAGNOSIS — F1310 Sedative, hypnotic or anxiolytic abuse, uncomplicated: Secondary | ICD-10-CM | POA: Diagnosis present

## 2015-05-07 DIAGNOSIS — Z8709 Personal history of other diseases of the respiratory system: Secondary | ICD-10-CM

## 2015-05-07 DIAGNOSIS — J45909 Unspecified asthma, uncomplicated: Secondary | ICD-10-CM | POA: Diagnosis present

## 2015-05-07 DIAGNOSIS — I455 Other specified heart block: Secondary | ICD-10-CM | POA: Diagnosis not present

## 2015-05-07 DIAGNOSIS — F909 Attention-deficit hyperactivity disorder, unspecified type: Secondary | ICD-10-CM | POA: Diagnosis present

## 2015-05-07 DIAGNOSIS — I440 Atrioventricular block, first degree: Secondary | ICD-10-CM | POA: Diagnosis present

## 2015-05-07 DIAGNOSIS — I441 Atrioventricular block, second degree: Secondary | ICD-10-CM | POA: Diagnosis present

## 2015-05-07 DIAGNOSIS — T50901A Poisoning by unspecified drugs, medicaments and biological substances, accidental (unintentional), initial encounter: Secondary | ICD-10-CM | POA: Diagnosis present

## 2015-05-07 DIAGNOSIS — H538 Other visual disturbances: Secondary | ICD-10-CM | POA: Diagnosis present

## 2015-05-07 DIAGNOSIS — I469 Cardiac arrest, cause unspecified: Secondary | ICD-10-CM

## 2015-05-07 DIAGNOSIS — R5383 Other fatigue: Secondary | ICD-10-CM | POA: Diagnosis present

## 2015-05-07 DIAGNOSIS — F17210 Nicotine dependence, cigarettes, uncomplicated: Secondary | ICD-10-CM | POA: Diagnosis present

## 2015-05-07 DIAGNOSIS — Z79899 Other long term (current) drug therapy: Secondary | ICD-10-CM | POA: Diagnosis not present

## 2015-05-07 DIAGNOSIS — R001 Bradycardia, unspecified: Secondary | ICD-10-CM | POA: Diagnosis present

## 2015-05-07 DIAGNOSIS — T50902A Poisoning by unspecified drugs, medicaments and biological substances, intentional self-harm, initial encounter: Secondary | ICD-10-CM | POA: Diagnosis not present

## 2015-05-07 DIAGNOSIS — F319 Bipolar disorder, unspecified: Secondary | ICD-10-CM | POA: Diagnosis present

## 2015-05-07 DIAGNOSIS — F1210 Cannabis abuse, uncomplicated: Secondary | ICD-10-CM | POA: Diagnosis present

## 2015-05-07 DIAGNOSIS — F419 Anxiety disorder, unspecified: Secondary | ICD-10-CM | POA: Diagnosis present

## 2015-05-07 DIAGNOSIS — R400 Somnolence: Secondary | ICD-10-CM | POA: Diagnosis present

## 2015-05-07 DIAGNOSIS — T424X2A Poisoning by benzodiazepines, intentional self-harm, initial encounter: Secondary | ICD-10-CM | POA: Diagnosis present

## 2015-05-07 DIAGNOSIS — F1110 Opioid abuse, uncomplicated: Secondary | ICD-10-CM | POA: Diagnosis present

## 2015-05-07 LAB — BASIC METABOLIC PANEL
ANION GAP: 9 (ref 5–15)
BUN: 9 mg/dL (ref 6–20)
CALCIUM: 9.1 mg/dL (ref 8.9–10.3)
CHLORIDE: 106 mmol/L (ref 101–111)
CO2: 23 mmol/L (ref 22–32)
Creatinine, Ser: 1.17 mg/dL (ref 0.61–1.24)
GFR calc non Af Amer: >60 mL/min (ref >60)
GLUCOSE: 105 mg/dL — AB (ref 65–99)
Potassium: 3.9 mmol/L (ref 3.5–5.1)
Sodium: 138 mmol/L (ref 135–145)

## 2015-05-07 LAB — BLOOD GAS, VENOUS
ACID-BASE DEFICIT: 1 mmol/L (ref 0.0–2.0)
BICARBONATE: 24.9 meq/L — AB (ref 20.0–24.0)
FIO2: 0.21
O2 Saturation: 92.8 %
PCO2 VEN: 48 mmHg (ref 45.0–50.0)
PH VEN: 7.335 — AB (ref 7.250–7.300)
PO2 VEN: 68.8 mmHg — AB (ref 31.0–45.0)
Patient temperature: 98.6
TCO2: 22.1 mmol/L (ref 0–100)

## 2015-05-07 LAB — MRSA PCR SCREENING: MRSA BY PCR: NEGATIVE

## 2015-05-07 LAB — RAPID URINE DRUG SCREEN, HOSP PERFORMED
Amphetamines: POSITIVE — AB
Barbiturates: NOT DETECTED
Benzodiazepines: POSITIVE — AB
Cocaine: NOT DETECTED
OPIATES: POSITIVE — AB
TETRAHYDROCANNABINOL: POSITIVE — AB

## 2015-05-07 LAB — TROPONIN I

## 2015-05-07 LAB — D-DIMER, QUANTITATIVE: D-Dimer, Quant: <0.27 ug/mL-FEU (ref 0.00–0.50)

## 2015-05-07 LAB — GLUCOSE, CAPILLARY: GLUCOSE-CAPILLARY: 91 mg/dL (ref 65–99)

## 2015-05-07 LAB — MAGNESIUM: Magnesium: 1.8 mg/dL (ref 1.7–2.4)

## 2015-05-07 MED ORDER — CETYLPYRIDINIUM CHLORIDE 0.05 % MT LIQD
7.0000 mL | Freq: Two times a day (BID) | OROMUCOSAL | Status: DC
Start: 2015-05-07 — End: 2015-05-10
  Administered 2015-05-08 – 2015-05-09 (×2): 7 mL via OROMUCOSAL

## 2015-05-07 MED ORDER — ATROPINE SULFATE 1 MG/10ML IJ SOSY
PREFILLED_SYRINGE | INTRAMUSCULAR | Status: AC
  Filled 2015-05-07: qty 10

## 2015-05-07 MED ORDER — SODIUM CHLORIDE 0.9 % IV SOLN
INTRAVENOUS | Status: DC
  Administered 2015-05-07 (×2): via INTRAVENOUS

## 2015-05-07 MED ORDER — ATROPINE SULFATE 0.4 MG/ML IJ SOLN
0.4000 mg | Freq: Once | INTRAMUSCULAR | Status: DC
  Filled 2015-05-07: qty 1

## 2015-05-07 MED ORDER — NALOXONE NEWBORN-WH INJECTION 0.4 MG/ML: 0.4000 mg | Freq: Once | INTRAMUSCULAR | Status: DC

## 2015-05-07 MED ORDER — MAGNESIUM SULFATE 2 GM/50ML IV SOLN
2.0000 g | Freq: Once | INTRAVENOUS | Status: AC
  Administered 2015-05-07: 2 g via INTRAVENOUS
  Filled 2015-05-07: qty 50

## 2015-05-07 MED ORDER — NALOXONE HCL 0.4 MG/ML IJ SOLN
INTRAMUSCULAR | Status: AC
  Filled 2015-05-07: qty 1

## 2015-05-07 MED ORDER — ENOXAPARIN SODIUM 30 MG/0.3ML ~~LOC~~ SOLN
30.0000 mg | SUBCUTANEOUS | Status: DC
  Administered 2015-05-07 – 2015-05-08 (×2): 30 mg via SUBCUTANEOUS
  Filled 2015-05-07 (×3): qty 0.3

## 2015-05-07 MED ORDER — NALOXONE HCL 0.4 MG/ML IJ SOLN: 0.4000 mg | INTRAMUSCULAR | Status: DC | PRN

## 2015-05-07 MED ORDER — IPRATROPIUM-ALBUTEROL 0.5-2.5 (3) MG/3ML IN SOLN: 3.0000 mL | RESPIRATORY_TRACT | Status: DC | PRN

## 2015-05-07 MED ORDER — PNEUMOCOCCAL VAC POLYVALENT 25 MCG/0.5ML IJ INJ
0.5000 mL | INJECTION | INTRAMUSCULAR | Status: DC
  Filled 2015-05-07 (×2): qty 0.5

## 2015-05-07 NOTE — Care Management Note (Signed)
Case Management Note  Patient Details  Name: Juanito DoomDashawn Rinks MRN: [id number] Date of Birth: 03-15-1995  Subjective/Objective: 20 y/o m admitted w/OD-suicide attempt. From home. Noted ivc papers signed.1:1.psych cons,Psych csw following.                   Action/Plan:d/c plan inpt psych.   Expected Discharge Date:                  [facility]  In-House Referral:  Clinical Social Work  Discharge planning Services  CM Consult  Post Acute Care Choice:    Choice offered to:     DME Arranged:    DME Agency:     HH Arranged:    HH Agency:     Status of Service:  In process, will continue to follow  Medicare Important Message Given:    Date Medicare IM Given:    Medicare IM give by:    Date Additional Medicare IM Given:    Additional Medicare Important Message give by:     If discussed at Long Length of Stay Meetings, dates discussed:    Additional Comments:  Lanier ClamMahabir, Sani Madariaga, RN 05/07/2015, 9:54 AM

## 2015-05-07 NOTE — Progress Notes (Signed)
Per pt's request, his wallet and all contents of wallet were deposited with [facility] security and locked in security locker. This RN personally delivered the pt's wallet to security at approximately 2340, 05/07/15. [facility] security provided this RN with a yellow paper receipt with taped metal key which this RN placed in pt's physical chart. This paperwork and key are to be returned to security by staff member in exchange for the pt's wallet at time of D/C.   Langley GaussSean Chanell Nadeau, RN/BSN

## 2015-05-07 NOTE — Progress Notes (Signed)
LCSW with psych service line [facility] of [facility]. Patient is not medically stable at this time, will assess when appropriate. Reviewed IVC papers, all are completed correctly and accurate.  Papers will expire on May 13, 2015 at 0225.   LCSW following for Psych Disposition and needs due to intentional overdose/SI.  Deretha EmoryHannah Shamere Dilworth LCSW, MSW Clinical Social Work: [employer] [phone]

## 2015-05-07 NOTE — Progress Notes (Signed)
RN contacted by CMT regarding patients HR. HR decreased 36, pause of 2.04 sec. VS stable. Patient is A&Ox4. Denies dizziness, weakness and or CP. Provider contacted. Will continue to monitor.

## 2015-05-07 NOTE — Progress Notes (Addendum)
Patient is alert and oriented x4, Spoke with patient regarding admission. Pt stated "I just wanted it to all be over". Pt was asked to clarify but would not go in to any detail. Sitter remains at bedside
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
## 2015-05-07 NOTE — Progress Notes (Signed)
PROGRESS NOTE    Warren Mcbride  KGO:[id number] DOB: [birth year] DOA: 05/06/2015 PCP: No primary care provider on file.  Outpatient Specialists: Dr Rayann Heman.     Brief Narrative:  Warren Mcbride is a 20 y.o. male with medical history significant of asthma, anxiety, depression, ADHD, second-degree  1 AV block; who presents after overdosing on Xanax.   Assessment & Plan:   Principal Problem:   Overdose Active Problems:   Tachypnea   History of asthma   Blurred vision   First degree AV block  1-Overdose; intentional, suicidal; More alert today. He report taking trazodone and xanax. Psych consulted.  Sitter at bedside.   2-History of AV block Mobitz 1; Pause on telemetry  EKG repeated.  Had 2.04 pause on telemetry. Cardiology consulted. Labs Ordered. B-met and Mg level.  Had another 3 second pauses, cardiology consulted. Will transfer to step down unit. atropini at bedside.   Polysubstance abuse: Patient's UDS was positive for amphetamines, benzos, opiates, and marijuana. Patient appears to have prescriptions for Adderall, hydrocodone, and Xanax. - Will likely need referral for treatment of underlying psych issues and detox - Home medications held currently, defer to psych resumption of adderall  Tachypnea with history of asthma: - Nasal cannula oxygen and keep O2 sats greater than 92%  - DuoNeb's prn sob or wheezing - D-dimer negative.      DVT prophylaxis: lovenox Code Status: full code.  Family Communication: care discussed with mother over phone Disposition Plan: awaiting Psych evaluation. And cardiology    Consultants:   Psych  Cardiology   Procedures:   none  Antimicrobials:    none   Subjective: He wake up to voice. Answer questions, follows command.  Denies pain.   Objective: Filed Vitals:   05/07/15 0133 05/07/15 0236 05/07/15 0243 05/07/15 0400  BP: 106/62 117/70  102/68  Pulse: 71 79    Temp:  97.6 F (36.4 C)  97.7 F (36.5 C)    TempSrc:  Axillary  Oral  Resp: 32 35 44 35  Height:  '6\' 2"'  (1.88 m)    SpO2: 92% 95% 93% 97%    Intake/Output Summary (Last 24 hours) at 05/07/15 1050 Last data filed at 05/07/15 0700  Gross per 24 hour  Intake    330 ml  Output      0 ml  Net    330 ml   Filed Weights    Examination:  General exam: alert, follows command.  Respiratory system: Clear to auscultation. Respiratory effort normal. Cardiovascular system: S1 & S2 heard, RRR. No JVD, murmurs, rubs, gallops or clicks. No pedal edema. Gastrointestinal system: Abdomen is nondistended, soft and nontender. No organomegaly or masses felt. Normal bowel sounds heard. Central nervous system: Alert and oriented. No focal neurological deficits. Extremities: Symmetric 5 x 5 power.     Data Reviewed: I have personally reviewed following labs and imaging studies  CBC:  Recent Labs Lab 05/06/15 2142  WBC 6.8  NEUTROABS 3.5  HGB 14.2  HCT 41.8  MCV 84.1  PLT 481   Basic Metabolic Panel:  Recent Labs Lab 05/06/15 2142  NA 140  K 3.8  CL 107  CO2 24  GLUCOSE 95  BUN 10  CREATININE 1.13  CALCIUM 9.5   GFR: CrCl cannot be calculated (Unknown ideal weight.). Liver Function Tests:  Recent Labs Lab 05/06/15 2142  AST 33  ALT 40  ALKPHOS 74  BILITOT 0.5  PROT 7.4  ALBUMIN 4.2   No results for input(s): LIPASE,  AMYLASE in the last 168 hours. No results for input(s): AMMONIA in the last 168 hours. Coagulation Profile: No results for input(s): INR, PROTIME in the last 168 hours. Cardiac Enzymes: No results for input(s): CKTOTAL, CKMB, CKMBINDEX, TROPONINI in the last 168 hours. BNP (last 3 results) No results for input(s): PROBNP in the last 8760 hours. HbA1C: No results for input(s): HGBA1C in the last 72 hours. CBG: No results for input(s): GLUCAP in the last 168 hours. Lipid Profile: No results for input(s): CHOL, HDL, LDLCALC, TRIG, CHOLHDL, LDLDIRECT in the last 72 hours. Thyroid Function  Tests: No results for input(s): TSH, T4TOTAL, FREET4, T3FREE, THYROIDAB in the last 72 hours. Anemia Panel: No results for input(s): VITAMINB12, FOLATE, FERRITIN, TIBC, IRON, RETICCTPCT in the last 72 hours. Urine analysis:    Component Value Date/Time   COLORURINE AMBER* 05/06/2015 2326   APPEARANCEUR TURBID* 05/06/2015 2326   LABSPEC 1.029 05/06/2015 2326   PHURINE 6.0 05/06/2015 2326   GLUCOSEU NEGATIVE 05/06/2015 2326   HGBUR NEGATIVE 05/06/2015 2326   BILIRUBINUR SMALL* 05/06/2015 2326   KETONESUR NEGATIVE 05/06/2015 2326   PROTEINUR NEGATIVE 05/06/2015 2326   UROBILINOGEN 0.2 02/16/2014 1313   NITRITE NEGATIVE 05/06/2015 2326   LEUKOCYTESUR SMALL* 05/06/2015 2326   Sepsis Labs: '@LABRCNTIP' (procalcitonin:4,lacticidven:4)  )No results found for this or any previous visit (from the past 240 hour(s)).       Radiology Studies: Dg Chest Port 1 View  05/07/2015  CLINICAL DATA:  Unresponsive.  Overdose. EXAM: PORTABLE CHEST 1 VIEW COMPARISON:  02/20/2015 FINDINGS: Normal heart size and mediastinal contours. No acute infiltrate or edema. No effusion or pneumothorax. No acute osseous findings. IMPRESSION: Negative portable chest. Electronically Signed   By: Monte Fantasia M.D.   On: 05/07/2015 01:23        Scheduled Meds: . antiseptic oral rinse  7 mL Mouth Rinse BID  . enoxaparin (LOVENOX) injection  30 mg Subcutaneous Q24H  . [START ON 05/08/2015] pneumococcal 23 valent vaccine  0.5 mL Intramuscular Tomorrow-1000   Continuous Infusions: . sodium chloride 75 mL/hr at 05/07/15 0236        Time spent: 35 minutes.     Elmarie Shiley, MD [facility] Pager [phone]  If 7PM-7AM, please contact night-coverage [email or web address] Password [email or web address] 05/07/2015, 10:50 AM

## 2015-05-07 NOTE — Consult Note (Signed)
CONSULTATION NOTE  Reason for Consult: Bradycardia, pauses  Requesting Physician: Dr. Tyrell Antonio  Cardiologist: Dr. Allred/Dr. Acie Fredrickson  HPI: This is a 20 y.o. male with a past medical history significant for asthma, anxiety, ADHD and depression. He was seen in 2015 for hospitalization after recurrent syncope. He had 2 episodes which were both precipitated by agitation and frustration. He reported the episodes is presyncope that resolved after several minutes. He was evaluated by Dr. Thompson Grayer for an abnormal EKG which demonstrated Mobitz 1 second-degree AV block. It was felt that he had a high resting vagal tone. There is also recreational drug use at that time. He did advise that Warren Mcbride could potentially benefit from pacemaker implantation in the long-term, however given his young age he recommended against it. He was advised to have a six-month driving hiatus. Warren Mcbride was seen by Dr. Acie Fredrickson in follow-up in May 2015 after that hospitalization. He was noted to have a low atrial rhythm with Mobitz type I block.  he is felt to be asymptomatic and his heart rate increase with exercise. He recommended follow-up with Dr. Rayann Heman on an as-needed basis which has not occurred.   Warren Mcbride now presents with overdose of Xanax. He apparently took 10 mg of Xanax 2 nights ago and 8 mg of Xanax yesterday morning with an additional 8 mg Xanax yesterday evening. It does seem that this was an intentional overdose. Tox screen on admission was positive for amphetamines, benzodiazepines, opiates and marijuana. Cardiology was consult to regarding episodic bradycardia and sinus pauses. He had 2 episodes, consisting of a 2.04 second pause for which she was asymptomatic, and a subsequent 3.0 second pause, again for which she was asymptomatic. Atropine was ordered, but does not appear to have been given. Both of these episodes happened early this afternoon. The patient has been involuntarily committed based on his  admission history and physical.  He currently denies any chest pain, worsening shortness of breath or palpitations.   PMHx:  Past Medical History  Diagnosis Date  . Asthma   . Anxiety   . ADHD (attention deficit hyperactivity disorder)   . Depression   . 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 Rayann Heman - no PPM indicated at the time; AV block due to increased vagal tone    Past Surgical History  Procedure Laterality Date  . No past surgeries      FAMHx: History reviewed. No pertinent family history. no family history of arrhythmia, premature coronary disease or sudden death.   SOCHx:  reports that he has been smoking Cigarettes.  He has a 1 pack-year smoking history. He has never used smokeless tobacco. He reports that he does not drink alcohol or use illicit drugs.  ALLERGIES: No Known Allergies  ROS: Review of systems not obtained due to patient factors.  HOME MEDICATIONS:   Medication List    ASK your doctor about these medications        alprazolam 2 MG tablet  Commonly known as:  XANAX  Take 8 mg by mouth 2 (two) times daily.     amphetamine-dextroamphetamine 30 MG 24 hr capsule  Commonly known as:  ADDERALL XR  Take 30 mg by mouth 2 (two) times daily.     cyclobenzaprine 10 MG tablet  Commonly known as:  FLEXERIL  Take 1 tablet (10 mg total) by mouth 2 (two) times daily as needed for muscle spasms.     HYDROcodone-acetaminophen 5-325  MG tablet  Commonly known as:  NORCO/VICODIN  Take 1-2 tablets by mouth every 6 (six) hours as needed.     ibuprofen 800 MG tablet  Commonly known as:  ADVIL,MOTRIN  Take 1 tablet (800 mg total) by mouth 3 (three) times daily.     ipratropium-albuterol 0.5-2.5 (3) MG/3ML Soln  Commonly known as:  DUONEB  Take 3 mLs by nebulization every 2 (two) hours as needed.     lamoTRIgine 100 MG tablet  Commonly known as:  LAMICTAL  Take 150 mg by mouth 2 (two) times daily.     ondansetron 4 MG disintegrating tablet    Commonly known as:  ZOFRAN ODT  Take 1 tablet (4 mg total) by mouth every 8 (eight) hours as needed for nausea or vomiting.     traZODone 50 MG tablet  Commonly known as:  DESYREL  Take 50 mg by mouth at bedtime.        HOSPITAL MEDICATIONS: Prior to Admission:  Prescriptions prior to admission  Medication Sig Dispense Refill Last Dose  . alprazolam (XANAX) 2 MG tablet Take 8 mg by mouth 2 (two) times daily.   05/06/2015 at Unknown time  . lamoTRIgine (LAMICTAL) 100 MG tablet Take 150 mg by mouth 2 (two) times daily.   over 1 month at [facility] time  . amphetamine-dextroamphetamine (ADDERALL XR) 30 MG 24 hr capsule Take 30 mg by mouth 2 (two) times daily.   over 1 month at unknown time  . cyclobenzaprine (FLEXERIL) 10 MG tablet Take 1 tablet (10 mg total) by mouth 2 (two) times daily as needed for muscle spasms. 20 tablet 0 unknown  . HYDROcodone-acetaminophen (NORCO/VICODIN) 5-325 MG tablet Take 1-2 tablets by mouth every 6 (six) hours as needed. (Patient taking differently: Take 1-2 tablets by mouth every 6 (six) hours as needed for moderate pain. ) 6 tablet 0 unknown  . ibuprofen (ADVIL,MOTRIN) 800 MG tablet Take 1 tablet (800 mg total) by mouth 3 (three) times daily. (Patient taking differently: Take 800 mg by mouth every 6 (six) hours as needed for headache or moderate pain. ) 21 tablet 0 unknown  . ipratropium-albuterol (DUONEB) 0.5-2.5 (3) MG/3ML SOLN Take 3 mLs by nebulization every 2 (two) hours as needed. (Patient taking differently: Take 3 mLs by nebulization every 2 (two) hours as needed (wheezing, shortness of breath). ) 360 mL 0 unknown  . ondansetron (ZOFRAN ODT) 4 MG disintegrating tablet Take 1 tablet (4 mg total) by mouth every 8 (eight) hours as needed for nausea or vomiting. (Patient not taking: Reported on 02/20/2015) 10 tablet 0 Completed Course at Unknown time  . traZODone (DESYREL) 50 MG tablet Take 50 mg by mouth at bedtime.   3 over 1 month at unknown time     VITALS: Blood pressure 137/67, pulse 62, temperature 98.1 F (36.7 C), temperature source Oral, resp. rate 26, height 6' (1.829 m), weight 240 lb (108.863 kg), SpO2 98 %.  PHYSICAL EXAM: General appearance: alert, no distress and HR in the 60-70's Neck: no carotid bruit and no JVD Lungs: clear to auscultation bilaterally Heart: regular rate and rhythm Abdomen: soft, non-tender; bowel sounds normal; no masses,  no organomegaly Extremities: extremities normal, atraumatic, no cyanosis or edema Pulses: 2+ and symmetric Skin: Skin color, texture, turgor normal. No rashes or lesions Neurologic: Grossly normal Psych: Pleasant, flat affect, clear that he wanted to die, asking for a psychiatrist  LABS: Results for orders placed or performed during the hospital encounter of 05/06/15 (from the  past 48 hour(s))  Comprehensive metabolic panel     Status: None   Collection Time: 05/06/15  9:42 PM  Result Value Ref Range   Sodium 140 135 - 145 mmol/L   Potassium 3.8 3.5 - 5.1 mmol/L   Chloride 107 101 - 111 mmol/L   CO2 24 22 - 32 mmol/L   Glucose, Bld 95 65 - 99 mg/dL   BUN 10 6 - 20 mg/dL   Creatinine, Ser 1.13 0.61 - 1.24 mg/dL   Calcium 9.5 8.9 - 10.3 mg/dL   Total Protein 7.4 6.5 - 8.1 g/dL   Albumin 4.2 3.5 - 5.0 g/dL   AST 33 15 - 41 U/L   ALT 40 17 - 63 U/L   Alkaline Phosphatase 74 38 - 126 U/L   Total Bilirubin 0.5 0.3 - 1.2 mg/dL   GFR calc non Af Amer >60 >60 mL/min   GFR calc Af Amer >60 >60 mL/min    Comment: (NOTE) The eGFR has been calculated using the CKD EPI equation. This calculation has not been validated in all clinical situations. eGFR's persistently <60 mL/min signify possible Chronic Kidney Disease.    Anion gap 9 5 - 15  CBC with Differential     Status: None   Collection Time: 05/06/15  9:42 PM  Result Value Ref Range   WBC 6.8 4.0 - 10.5 K/uL   RBC 4.97 4.22 - 5.81 MIL/uL   Hemoglobin 14.2 13.0 - 17.0 g/dL   HCT 41.8 39.0 - 52.0 %   MCV 84.1 78.0 -  100.0 fL   MCH 28.6 26.0 - 34.0 pg   MCHC 34.0 30.0 - 36.0 g/dL   RDW 13.9 11.5 - 15.5 %   Platelets 175 150 - 400 K/uL   Neutrophils Relative % 51 %   Neutro Abs 3.5 1.7 - 7.7 K/uL   Lymphocytes Relative 40 %   Lymphs Abs 2.8 0.7 - 4.0 K/uL   Monocytes Relative 8 %   Monocytes Absolute 0.5 0.1 - 1.0 K/uL   Eosinophils Relative 1 %   Eosinophils Absolute 0.1 0.0 - 0.7 K/uL   Basophils Relative 0 %   Basophils Absolute 0.0 0.0 - 0.1 K/uL  Salicylate level     Status: None   Collection Time: 05/06/15  9:43 PM  Result Value Ref Range   Salicylate Lvl <2.4 2.8 - 30.0 mg/dL  Acetaminophen level     Status: Abnormal   Collection Time: 05/06/15  9:43 PM  Result Value Ref Range   Acetaminophen (Tylenol), Serum <10 (L) 10 - 30 ug/mL    Comment:        THERAPEUTIC CONCENTRATIONS VARY SIGNIFICANTLY. A RANGE OF 10-30 ug/mL MAY BE AN EFFECTIVE CONCENTRATION FOR MANY PATIENTS. HOWEVER, SOME ARE BEST TREATED AT CONCENTRATIONS OUTSIDE THIS RANGE. ACETAMINOPHEN CONCENTRATIONS >150 ug/mL AT 4 HOURS AFTER INGESTION AND >50 ug/mL AT 12 HOURS AFTER INGESTION ARE OFTEN ASSOCIATED WITH TOXIC REACTIONS.   Ethanol     Status: None   Collection Time: 05/06/15  9:43 PM  Result Value Ref Range   Alcohol, Ethyl (B) <5 <5 mg/dL    Comment:        LOWEST DETECTABLE LIMIT FOR SERUM ALCOHOL IS 5 mg/dL FOR MEDICAL PURPOSES ONLY   Urinalysis, Routine w reflex microscopic     Status: Abnormal   Collection Time: 05/06/15 11:26 PM  Result Value Ref Range   Color, Urine AMBER (A) YELLOW    Comment: BIOCHEMICALS MAY BE AFFECTED BY COLOR  APPearance TURBID (A) CLEAR   Specific Gravity, Urine 1.029 1.005 - 1.030   pH 6.0 5.0 - 8.0   Glucose, UA NEGATIVE NEGATIVE mg/dL   Hgb urine dipstick NEGATIVE NEGATIVE   Bilirubin Urine SMALL (A) NEGATIVE   Ketones, ur NEGATIVE NEGATIVE mg/dL   Protein, ur NEGATIVE NEGATIVE mg/dL   Nitrite NEGATIVE NEGATIVE   Leukocytes, UA SMALL (A) NEGATIVE  Urine rapid  drug screen (hosp performed)     Status: Abnormal   Collection Time: 05/06/15 11:26 PM  Result Value Ref Range   Opiates POSITIVE (A) NONE DETECTED   Cocaine NONE DETECTED NONE DETECTED   Benzodiazepines POSITIVE (A) NONE DETECTED   Amphetamines POSITIVE (A) NONE DETECTED   Tetrahydrocannabinol POSITIVE (A) NONE DETECTED   Barbiturates NONE DETECTED NONE DETECTED    Comment:        DRUG SCREEN FOR MEDICAL PURPOSES ONLY.  IF CONFIRMATION IS NEEDED FOR ANY PURPOSE, NOTIFY LAB WITHIN 5 DAYS.        LOWEST DETECTABLE LIMITS FOR URINE DRUG SCREEN Drug Class       Cutoff (ng/mL) Amphetamine      1000 Barbiturate      200 Benzodiazepine   354 Tricyclics       562 Opiates          300 Cocaine          300 THC              50   Urine microscopic-add on     Status: Abnormal   Collection Time: 05/06/15 11:26 PM  Result Value Ref Range   Squamous Epithelial / LPF NONE SEEN NONE SEEN   WBC, UA 0-5 0 - 5 WBC/hpf   RBC / HPF NONE SEEN 0 - 5 RBC/hpf   Bacteria, UA MANY (A) NONE SEEN   Urine-Other AMORPHOUS URATES/PHOSPHATES   Blood gas, venous     Status: Abnormal   Collection Time: 05/07/15 12:35 AM  Result Value Ref Range   FIO2 0.21    pH, Ven 7.335 (H) 7.250 - 7.300   pCO2, Ven 48.0 45.0 - 50.0 mmHg   pO2, Ven 68.8 (H) 31.0 - 45.0 mmHg   Bicarbonate 24.9 (H) 20.0 - 24.0 mEq/L   TCO2 22.1 0 - 100 mmol/L   Acid-base deficit 1.0 0.0 - 2.0 mmol/L   O2 Saturation 92.8 %   Patient temperature 98.6    Collection site VEIN    Drawn by COLLECTED BY LABORATORY    Sample type VENOUS   D-dimer, quantitative (not at [facility])     Status: None   Collection Time: 05/07/15  6:00 AM  Result Value Ref Range   D-Dimer, Quant <0.27 0.00 - 0.50 ug/mL-FEU    Comment: (NOTE) At the manufacturer cut-off of 0.50 ug/mL FEU, this assay has been documented to exclude PE with a sensitivity and negative predictive value of 97 to 99%.  At this time, this assay has not been approved by the FDA to exclude  DVT/VTE. Results should be correlated with clinical presentation.   Troponin I (q 6hr x 3)     Status: None   Collection Time: 05/07/15  1:15 PM  Result Value Ref Range   Troponin I <0.03 <0.031 ng/mL    Comment:        NO INDICATION OF MYOCARDIAL INJURY.   Basic metabolic panel     Status: Abnormal   Collection Time: 05/07/15  2:29 PM  Result Value Ref Range   Sodium 138  135 - 145 mmol/L   Potassium 3.9 3.5 - 5.1 mmol/L   Chloride 106 101 - 111 mmol/L   CO2 23 22 - 32 mmol/L   Glucose, Bld 105 (H) 65 - 99 mg/dL   BUN 9 6 - 20 mg/dL   Creatinine, Ser 1.17 0.61 - 1.24 mg/dL   Calcium 9.1 8.9 - 10.3 mg/dL   GFR calc non Af Amer >60 >60 mL/min   GFR calc Af Amer >60 >60 mL/min    Comment: (NOTE) The eGFR has been calculated using the CKD EPI equation. This calculation has not been validated in all clinical situations. eGFR's persistently <60 mL/min signify possible Chronic Kidney Disease.    Anion gap 9 5 - 15  Magnesium     Status: None   Collection Time: 05/07/15  2:29 PM  Result Value Ref Range   Magnesium 1.8 1.7 - 2.4 mg/dL  Glucose, capillary     Status: None   Collection Time: 05/07/15  3:21 PM  Result Value Ref Range   Glucose-Capillary 91 65 - 99 mg/dL    IMAGING: Dg Chest Port 1 View  05/07/2015  CLINICAL DATA:  Unresponsive.  Overdose. EXAM: PORTABLE CHEST 1 VIEW COMPARISON:  02/20/2015 FINDINGS: Normal heart size and mediastinal contours. No acute infiltrate or edema. No effusion or pneumothorax. No acute osseous findings. IMPRESSION: Negative portable chest. Electronically Signed   By: Monte Fantasia M.D.   On: 05/07/2015 01:23    HOSPITAL DIAGNOSES: Principal Problem:   Overdose Active Problems:   Tachypnea   History of asthma   Blurred vision   First degree AV block   IMPRESSION: 1. Intentional overdose / suicide attempt 2. Sinus pause/asystole 3. H/o of Mobitz I AV block  RECOMMENDATION: 1. Warren Mcbride presents with polysubstance  abuse/intentional overdose, somnolence and intermittent episodes of sinus pause / asystole from up to 3 seconds. In the setting of polysubstance overdose, I would not pursue pacemaker -especially since he is asymptomatic. Would have external pacemaker available. He needs urgent psychiatric evaluation and more discussion about this suicide attempt. Would avoid any AVN blocking medications.  Thanks for the consultation.  Time Spent Directly with Patient: 30 minutes  Pixie Casino, MD, [facility] Attending Cardiologist [place] 05/07/2015, 4:07 PM

## 2015-05-07 NOTE — ED Notes (Signed)
Hospitalist at bedside 

## 2015-05-07 NOTE — Progress Notes (Addendum)
RN contacted by CMT, low HR reported. HR decreased 27 non-sustained, pause 3.07 sec. HR now 72. VS stable. Patient is A&Ox4. Denied CP, sob, weakness and or dizziness. Pt stated "im ok just tired" Provider contacted to report updates. Order received to transfer patient to stepdown.

## 2015-05-07 NOTE — H&P (Signed)
History and Physical    Warren Mcbride ZOX:[id number] DOB: Feb 09, 1995 DOA: 05/06/2015  Referring MD/NP/PA: Pecola Leisure MD PCP: No primary care provider on file.   Outpatient Specialists:  Unknown Patient coming from: Home via EMS  Chief Complaint: Overdose  HPI: Warren Mcbride is a 20 y.o. male with medical history significant of asthma, anxiety, depression, ADHD, second-degree orbits 1 AV block; who presents after overdosing on Xanax. Patient history was obtained from report as patient is somnolent and difficult to arouse. Reported taking five  Xanax 2 nights ago,four 2 mg  Xanax yesterday morning, and additional four 2 mg  yesterday evening. Patient was complaining of blurred vision and lethargy upon EMS arrival. This appears be patient's first suicide attempt.   ED Course: On admission to the emergency department patient was evaluated with a urine drug screen was positive for amphetamines, benzos, opiates, and marijuana.  Review of Systems: Unable to obtain secondary to patient acute condition with somnolence.   Past Medical History  Diagnosis Date  . Asthma   . Anxiety   . ADHD (attention deficit hyperactivity disorder)   . Depression   . 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 Johney Frame - no PPM indicated at the time; AV block due to increased vagal tone     Past Surgical History  Procedure Laterality Date  . No past surgeries       reports that he has been smoking Cigarettes.  He has a 1 pack-year smoking history. He has never used smokeless tobacco. He reports that he does not drink alcohol or use illicit drugs.  No Known Allergies  No family history on file.  Prior to Admission medications   Medication Sig Start Date End Date Taking? Authorizing Provider  alprazolam Prudy Feeler) 2 MG tablet Take 8 mg by mouth 2 (two) times daily.   Yes Historical Provider, MD  lamoTRIgine (LAMICTAL) 100 MG tablet Take 150 mg by mouth 2 (two) times daily.   Yes Historical  Provider, MD  amphetamine-dextroamphetamine (ADDERALL XR) 30 MG 24 hr capsule Take 30 mg by mouth 2 (two) times daily.    Historical Provider, MD  cyclobenzaprine (FLEXERIL) 10 MG tablet Take 1 tablet (10 mg total) by mouth 2 (two) times daily as needed for muscle spasms. 02/20/15   Danelle Berry, PA-C  HYDROcodone-acetaminophen (NORCO/VICODIN) 5-325 MG tablet Take 1-2 tablets by mouth every 6 (six) hours as needed. Patient taking differently: Take 1-2 tablets by mouth every 6 (six) hours as needed for moderate pain.  02/20/15   Danelle Berry, PA-C  ibuprofen (ADVIL,MOTRIN) 800 MG tablet Take 1 tablet (800 mg total) by mouth 3 (three) times daily. Patient taking differently: Take 800 mg by mouth every 6 (six) hours as needed for headache or moderate pain.  02/20/15   Danelle Berry, PA-C  ipratropium-albuterol (DUONEB) 0.5-2.5 (3) MG/3ML SOLN Take 3 mLs by nebulization every 2 (two) hours as needed. Patient taking differently: Take 3 mLs by nebulization every 2 (two) hours as needed (wheezing, shortness of breath).  12/11/14   Stevi Barrett, PA-C  ondansetron (ZOFRAN ODT) 4 MG disintegrating tablet Take 1 tablet (4 mg total) by mouth every 8 (eight) hours as needed for nausea or vomiting. Patient not taking: Reported on 02/20/2015 01/16/15   Everlene Farrier, PA-C  traZODone (DESYREL) 50 MG tablet Take 50 mg by mouth at bedtime.  11/26/14   Historical Provider, MD    Physical Exam: Filed Vitals:   05/06/15 2330 05/06/15 2353 05/07/15  0000 05/07/15 0030  BP: 117/58 117/58 117/52 109/46  Pulse: 81 85 91 81  Temp:      TempSrc:      Resp: 39 36 35 38  SpO2: 95% 90% 97% 92%      Constitutional:  Somnolent, but arousable for brief moments in time with noxious stimuli  Filed Vitals:   05/06/15 2330 05/06/15 2353 05/07/15 0000 05/07/15 0030  BP: 117/58 117/58 117/52 109/46  Pulse: 81 85 91 81  Temp:      TempSrc:      Resp: 39 36 35 38  SpO2: 95% 90% 97% 92%   Eyes: PERRL, lids and conjunctivae  normal ENMT: Mucous membranes are moist. Posterior pharynx clear of any exudate or lesions.Normal dentition.  Neck: normal, supple, no masses, no thyromegaly Respiratory: Tachypneic with no wheezes appreciated Cardiovascular: Regular rate and rhythm, no murmurs / rubs / gallops. No extremity edema. 2+ pedal pulses. No carotid bruits.  Abdomen: no tenderness, no masses palpated. No hepatosplenomegaly. Bowel sounds positive.  Musculoskeletal: no clubbing / cyanosis. No joint deformity upper and lower extremities. Good ROM, no contractures. Normal muscle tone.  Skin: no rashes, lesions, ulcers. No induration Neurologic: CN 2-12 grossly intact. Sensation intact, DTR normal. Strength 5/5 in all 4.  Psychiatric: Poor judgment and insight    Labs on Admission: I have personally reviewed following labs and imaging studies  CBC:  Recent Labs Lab 05/06/15 2142  WBC 6.8  NEUTROABS 3.5  HGB 14.2  HCT 41.8  MCV 84.1  PLT 175   Basic Metabolic Panel:  Recent Labs Lab 05/06/15 2142  NA 140  K 3.8  CL 107  CO2 24  GLUCOSE 95  BUN 10  CREATININE 1.13  CALCIUM 9.5   GFR: CrCl cannot be calculated (Unknown ideal weight.). Liver Function Tests:  Recent Labs Lab 05/06/15 2142  AST 33  ALT 40  ALKPHOS 74  BILITOT 0.5  PROT 7.4  ALBUMIN 4.2   No results for input(s): LIPASE, AMYLASE in the last 168 hours. No results for input(s): AMMONIA in the last 168 hours. Coagulation Profile: No results for input(s): INR, PROTIME in the last 168 hours. Cardiac Enzymes: No results for input(s): CKTOTAL, CKMB, CKMBINDEX, TROPONINI in the last 168 hours. BNP (last 3 results) No results for input(s): PROBNP in the last 8760 hours. HbA1C: No results for input(s): HGBA1C in the last 72 hours. CBG: No results for input(s): GLUCAP in the last 168 hours. Lipid Profile: No results for input(s): CHOL, HDL, LDLCALC, TRIG, CHOLHDL, LDLDIRECT in the last 72 hours. Thyroid Function Tests: No  results for input(s): TSH, T4TOTAL, FREET4, T3FREE, THYROIDAB in the last 72 hours. Anemia Panel: No results for input(s): VITAMINB12, FOLATE, FERRITIN, TIBC, IRON, RETICCTPCT in the last 72 hours. Urine analysis:    Component Value Date/Time   COLORURINE AMBER* 05/06/2015 2326   APPEARANCEUR TURBID* 05/06/2015 2326   LABSPEC 1.029 05/06/2015 2326   PHURINE 6.0 05/06/2015 2326   GLUCOSEU NEGATIVE 05/06/2015 2326   HGBUR NEGATIVE 05/06/2015 2326   BILIRUBINUR SMALL* 05/06/2015 2326   KETONESUR NEGATIVE 05/06/2015 2326   PROTEINUR NEGATIVE 05/06/2015 2326   UROBILINOGEN 0.2 02/16/2014 1313   NITRITE NEGATIVE 05/06/2015 2326   LEUKOCYTESUR SMALL* 05/06/2015 2326   Sepsis Labs:  No results found for this or any previous visit (from the past 240 hour(s)).   Radiological Exams on Admission: No results found.  EKG: Independently reviewed. Prolonged PR interval 215 ms  Assessment/Plan Overdose with Xanax: The exact circumstances  surrounding the patient wanting to harm himself are currently unknown. Salicylate, acetaminophen, and alcohol screens negative.  - Admit to a telemetry bed - Continue to monitor - IVF NS at 75 cc/hr - Commitment papers signed - Sided to bedside - Will need to Consult TTS when patient alert and awake  Blurred vision: Secondary to overdose of benzodiazepine  - Continue to monitor   Polysubstance abuse: Patient's UDS was positive for amphetamines, benzos, opiates, and marijuana. Patient appears to have prescriptions for Adderall, hydrocodone, and Xanax. - Will likely need referral for treatment of underlying psych issues and detox - Home medications held currently  Tachypnea with history of asthma: - Nasal cannula oxygen and keep O2 sats greater than 92%  -  DuoNeb's prn sob or wheezing - D-dimer pending   First-degree AV heart block: PR interval greater than 200 ms - Continue to monitor on telemetry  DVT prophylaxis: Lovenox Code Status:  Full Family Communication:  None Disposition Plan: Discharge with TTS once alert and awake Consults called:  Admission status:  Telemetry observation Clydie Braun MD [facility] Pager [phone]  If 7PM-7AM, please contact night-coverage [email or web address] Password [email or web address]  05/07/2015, 1:01 AM

## 2015-05-07 NOTE — Plan of Care (Signed)
Problem: Education: Goal: Knowledge of [facility] Education information/materials will improve Outcome: Completed/Met Date Met:  05/07/15 Patient provided education regarding treatment plan

## 2015-05-08 DIAGNOSIS — F1920 Other psychoactive substance dependence, uncomplicated: Secondary | ICD-10-CM

## 2015-05-08 DIAGNOSIS — R45851 Suicidal ideations: Secondary | ICD-10-CM

## 2015-05-08 DIAGNOSIS — F902 Attention-deficit hyperactivity disorder, combined type: Secondary | ICD-10-CM

## 2015-05-08 DIAGNOSIS — R0682 Tachypnea, not elsewhere classified: Secondary | ICD-10-CM

## 2015-05-08 DIAGNOSIS — T50902A Poisoning by unspecified drugs, medicaments and biological substances, intentional self-harm, initial encounter: Secondary | ICD-10-CM

## 2015-05-08 DIAGNOSIS — T424X2A Poisoning by benzodiazepines, intentional self-harm, initial encounter: Principal | ICD-10-CM

## 2015-05-08 DIAGNOSIS — Z8709 Personal history of other diseases of the respiratory system: Secondary | ICD-10-CM

## 2015-05-08 DIAGNOSIS — F3130 Bipolar disorder, current episode depressed, mild or moderate severity, unspecified: Secondary | ICD-10-CM | POA: Diagnosis present

## 2015-05-08 DIAGNOSIS — F1120 Opioid dependence, uncomplicated: Secondary | ICD-10-CM | POA: Diagnosis present

## 2015-05-08 DIAGNOSIS — T1491 Suicide attempt: Secondary | ICD-10-CM

## 2015-05-08 DIAGNOSIS — T424X1A Poisoning by benzodiazepines, accidental (unintentional), initial encounter: Secondary | ICD-10-CM | POA: Insufficient documentation

## 2015-05-08 DIAGNOSIS — I455 Other specified heart block: Secondary | ICD-10-CM | POA: Insufficient documentation

## 2015-05-08 LAB — BASIC METABOLIC PANEL
Anion gap: 14 (ref 5–15)
BUN: 9 mg/dL (ref 6–20)
CHLORIDE: 110 mmol/L (ref 101–111)
CO2: 17 mmol/L — ABNORMAL LOW (ref 22–32)
CREATININE: 1.24 mg/dL (ref 0.61–1.24)
Calcium: 9.1 mg/dL (ref 8.9–10.3)
Glucose, Bld: 100 mg/dL — ABNORMAL HIGH (ref 65–99)
Potassium: 4 mmol/L (ref 3.5–5.1)
Sodium: 141 mmol/L (ref 135–145)

## 2015-05-08 LAB — TROPONIN I

## 2015-05-08 MED ORDER — ENOXAPARIN SODIUM 60 MG/0.6ML ~~LOC~~ SOLN
60.0000 mg | SUBCUTANEOUS | Status: DC
  Administered 2015-05-09: 60 mg via SUBCUTANEOUS
  Filled 2015-05-08: qty 0.6

## 2015-05-08 MED ORDER — SODIUM CHLORIDE 0.9 % IV SOLN
INTRAVENOUS | Status: DC
  Administered 2015-05-08 – 2015-05-09 (×2): via INTRAVENOUS

## 2015-05-08 NOTE — Clinical Social Work Psych Assess (Signed)
Clinical Social Work Environmental manager Social Worker:  [place], [place] Date/Time:  05/08/2015, 11:52 AM Referred By:  Physician Date Referred:  05/08/15 Reason for Referral:  Behavioral Health Issues   Presenting Symptoms/Problems  Presenting Symptoms/Problems(in person's/family's own words):  " I didn't feel loved and I just wanted to go to sleep so I bought some Xanax off the street to never wake up".  Patient admitted with intentional overdose reporting he has been off his psychiatric medication since November 2016 due to loss of Medicaid (insurance). Patient reports history of MH since 2013 when he was dx Bipolar 2, ADHD.     Abuse/Neglect/Trauma History  Abuse/Neglect/Trauma History:  Denies History Abuse/Neglect/Trauma History Comments (indicate dates):  NA  Psychiatric History  Psychiatric History:  Outpatient Treatment (previous medication management/therapy) Psychiatric Medication:  Adderal, Lamictal, Trazodone.  Xanax is not a prescription per patient report as he was buying for recreational use.   Current Mental Health Hospitalizations/Previous Mental Health History: Patient has been seen at [facility] in the past as well as [facility].  Reports other providers, however unable to disclose names/providers.   Current Provider:  None Place and Date:  none  Current Medications:   Scheduled Meds: . antiseptic oral rinse  7 mL Mouth Rinse BID  . atropine  0.4 mg Intravenous Once  . [START ON 05/09/2015] enoxaparin (LOVENOX) injection  60 mg Subcutaneous Q24H  . pneumococcal 23 valent vaccine  0.5 mL Intramuscular Tomorrow-1000   Continuous Infusions:  PRN Meds:.ipratropium-albuterol    Previous Inpatient Admission/Date/Reason:  No previous admissions.   Emotional Health/Current Symptoms  Suicide/Self Harm: Self-Injurious Behaviors (ex. picking & pinching or carving on skin, chronic runaway, poor judgement), Suicidal Ideation (ex. "I can't  take anymore, I wish I could disappear"), Suicide Attempt in the Past (date/description) Suicide Attempt in Past (date/description): reports no previous attempts.  Other Harmful Behavior (ex. homicidal ideation) (describe):  None at this time.  Patient reports history of hanging out with the wrong people growing up in high school and young adulthood.  Patient reports he is currently on probation for the next 18 months.  Patient recently had court date on Tuesday April 25th which extended his probation which he was already on for the last 36 months.  Patient reports he has felony and misdeamanor charges.   Psychotic/Dissociative Symptoms  Psychotic/Dissociative Symptoms: None Reported Other Psychotic/Dissociative Symptoms:  Patient denies AVH as well as delusions. He reports he has been more paranoid with people out to get him or going to hurt him.  Attention/Behavioral Symptoms  Attention/Behavioral Symptoms: Impulsive Other Attention/Behavioral Symptoms:  Patient reports he is angry all the time when he wakes up until he goes to bed. Reports SI thoughts and impulsive behavior on a daily basis.    Cognitive Impairment  Cognitive Impairment:  Within Normal Limits, Orientation - Situation, Orientation - Time, Orientation - Place, Orientation - Self Other Cognitive Impairment:  NA   Mood and Adjustment  Mood and Adjustment:  Labile, Paranoia, Depression   Stress, Anxiety, Trauma, Any Recent Loss/Stressor  Stress, Anxiety, Trauma, Any Recent Loss/Stressor: Avoidance, Anxiety (poor sleep with waking up a frequently, not able to fall asleep or stay asleep) Anxiety (frequency):  Patient reports long history with law and inability to be cleared from probation.  Reports he does not feel loved, but does not go into detail regarding statement or by whom.   Phobia (specify):  NA  Compulsive Behavior (specify):  NA   Obsessive Behavior (specify):  NA  Other Stress, Anxiety, Trauma, Any  Recent Loss/Stressor:  None reported   Substance Abuse/Use  Substance Abuse/Use: Current Substance Use (THC and Xanax) SBIRT Completed (please refer for detailed history): No Self-reported Substance Use (last use and frequency):  Patient using THC on a daily basis. Reports no other drug use.  Patient smokes 1/2 pack a day of cigarettes.   Urinary Drug Screen Completed: Yes  Positive for Amphetamines, opiates, benzos, and THC Alcohol Level:  NA   Environment/Housing/Living Arrangement  Environmental/Housing/Living Arrangement: Stable Housing (lives with mother and brothers) Who is in the Home:  Patient lives with mother and brothers in home.  Originally from [place], [place] but has been in [place] for last 10 years,  Emergency Contact:  Mother   Financial  Financial: IPRS (no insurance)   Patient's Strengths and Goals  Patient's Strengths and Goals (patient's own words):  " I know I need to get back on my medications and figure out why I am so angry all the time"   Clinical Social Worker's Interpretive Summary  Clinical Social Workers Interpretive Summary:  LCSW completed psych assessment.  Appears patient very well aware of his actions and reports remorse and guilt as it was impulsive and not thought out.  Reports he should have called to speak to someone instead of trying to do things alone and on his own.  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  He also works as a Biomedical scientist at [employer].  Currently on probation through [facility]. Past offenses: larceny and breaking an entering.  Reports others, but those two are most recent. LCSW met with his ex-girlfriend in the hallway to get more information and concerns. Ex-girlfriend reports they are still best friends, but not together any longer. She reports he typically is very docile and sweet. Reports this is out of the norm behavior and he has never done anything like this. She denies at recent traumas  or stressful situations at this time. Patient mother would be another good person to obtain information from. LCSW will call mother after 1:30pm today to speak with her regarding DC plan and other important information associated with suicide attempt.    Disposition  Disposition: Inpatient Referral Made ([facility]), Recommend Psych CSW Continuing To Support While In [facility]   Awaiting Psych MD consult note. Discussed case with psych MD. Most likely patient will be referred to inpatient hospitalization due to intentional overdose, being off medications for last 6 months and currently under IVC. Patient made aware of plan at DC and is not happy, but understanding in assessment this is our legal and ethical obligation to get him the care he needs and safest discharge plan.  Lane Hacker, MSW Clinical Social Work: [employer] [phone]

## 2015-05-08 NOTE — Progress Notes (Signed)
[facility], Pt arrived unit accompanied by family members. Alert and oriented, able to communicate needs, will continue with current plan of care.

## 2015-05-08 NOTE — Progress Notes (Signed)
Patient Name: Warren Mcbride Date of Encounter: 05/08/2015  Hospital Problem List     Principal Problem:   Overdose Active Problems:   Tachypnea   History of asthma   Blurred vision   First degree AV block   Pt Profile:   20 y.o. male with medical history significant of asthma, anxiety, depression, ADHD, second-degree 1 AV block; who presents after overdosing on Xanax.  Subjective   Reports feeling well this morning. No episodes of dizziness/ light-headedness or syncope during the night.   Inpatient Medications    . antiseptic oral rinse  7 mL Mouth Rinse BID  . atropine  0.4 mg Intravenous Once  . enoxaparin (LOVENOX) injection  30 mg Subcutaneous Q24H  . pneumococcal 23 valent vaccine  0.5 mL Intramuscular Tomorrow-1000    Vital Signs    Filed Vitals:   05/07/15 2200 05/08/15 0000 05/08/15 0400 05/08/15 0616  BP: 104/42 130/56 103/46   Pulse:   52   Temp:  97.9 F (36.6 C)  98.7 F (37.1 C)  TempSrc:  Oral  Oral  Resp: Height:      Weight:      SpO2:  94% 95%     Intake/Output Summary (Last 24 hours) at 05/08/15 0733 Last data filed at 05/08/15 1610  Gross per 24 hour  Intake   2540 ml  Output      0 ml  Net   2540 ml   Filed Weights   05/07/15 0815 05/07/15 1628  Weight: 240 lb (108.863 kg) 277 lb 9 oz (125.9 kg)    Physical Exam    General: Pleasant young male, NAD. Neuro: Alert and oriented X 3. Moves all extremities spontaneously. Psych: Normal affect. HEENT:  Normal  Neck: Supple without bruits or JVD. Lungs:  Resp regular and unlabored, CTA. Heart: RRR no s3, s4, or murmurs. Abdomen: Soft, non-tender, non-distended, BS + x 4.  Extremities: No clubbing, cyanosis or edema. DP/PT/Radials 2+ and equal bilaterally.  Labs    CBC  Recent Labs  05/06/15 2142  WBC 6.8  NEUTROABS 3.5  HGB 14.2  HCT 41.8  MCV 84.1  PLT 175   Basic Metabolic Panel  Recent Labs  05/06/15 2142 05/07/15 1429  NA 140 138  K 3.8 3.9  CL  107 106  CO2 24 23  GLUCOSE 95 105*  BUN 10 9  CREATININE 1.13 1.17  CALCIUM 9.5 9.1  MG  --  1.8   Liver Function Tests  Recent Labs  05/06/15 2142  AST 33  ALT 40  ALKPHOS 74  BILITOT 0.5  PROT 7.4  ALBUMIN 4.2   Cardiac Enzymes  Recent Labs  05/07/15 1315 05/07/15 2020 05/08/15 0613  TROPONINI <0.03 <0.03 <0.03   D-Dimer  Recent Labs  05/07/15 0600  DDIMER <0.27    Telemetry    Rate- 50 Mobitz 1 second degree AV block, with occasional pauses <2 secs, asymptomatic.   ECG    No morning EKG  Radiology    Dg Chest Port 1 View  05/07/2015  CLINICAL DATA:  Unresponsive.  Overdose. EXAM: PORTABLE CHEST 1 VIEW COMPARISON:  02/20/2015 FINDINGS: Normal heart size and mediastinal contours. No acute infiltrate or edema. No effusion or pneumothorax. No acute osseous findings. IMPRESSION: Negative portable chest. Electronically Signed   By: Marnee Spring M.D.   On: 05/07/2015 01:23    Assessment & Plan    1. Intentional overdose / suicide attempt: Per IM  2.  Sinus pause/asystole: Previously seen by Dr. Hillis RangeJames Allred in the past related to dx of recent syncope and had an abnormal EKG that showed Mobitz 1 second degree AV block. During this admission for intentional overdose, cardiology was consulted for episodic bradycardia and sinus pauses. Yesterday seen by Dr. Rennis GoldenHilty and determined asymptomatic during 2 episodes of pauses lastly around 2 to 3 secs. Atropine remains ordered, but not been given, along with external pacemaker at the bedside. Observed telemetry over night and he appears to have some occasional dropped beats, and pauses that last under 2 secs, all of which he was remained asymptomatic during by report. It was stated at this time a pacemaker would not be pursued given the setting of polysubstance abuse and since he is asymptomatic.   3. H/o of Mobitz I AV block: Continue to avoid any AVN blocking medication  Signed, Laverda PageLindsay Jenna Routzahn NP-C Pager  [phone]

## 2015-05-08 NOTE — Consult Note (Addendum)
[facility] Consult   Reason for Consult:  Depression, polysubstance abuse and s/p suicide attempt Referring Physician:  Dr. Tyrell Antonio Patient Identification: Warren Mcbride MRN:  [id number] Principal Diagnosis: Bipolar I disorder, most recent episode depressed ([place]) Diagnosis:   Patient Active Problem List   Diagnosis Date Noted  . Overdose [T50.901A] 05/07/2015  . Tachypnea [R06.82] 05/07/2015  . History of asthma [Z87.09] 05/07/2015  . Blurred vision [H53.8] 05/07/2015  . 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 Syncope [R55] 05/18/2013    Total Time spent with patient: 1 hour  Subjective:   Warren Mcbride is a 20 y.o. male patient admitted with intentional drug overdose.  HPI:  Warren Mcbride is a 20 y.o. male, seen, chart reviewed and case discussed with psych LCSW for the face-to-face psychiatric consultation and evaluation of increased symptoms of depression and status post suicidal attempt by taking intentional overdose of Xanax 2 mg5. Patient stated that he feels nobody love him, took medication from street with intention to go to sleep and not wake up. Reportedly patient mother was not at home during this episode, he reports being dictated for the incident. He also reportedly unable to access his outpatient psychiatrist and therapist secondary to no health insurance since November 2016. Patient reportedly working in [employer], want to pay his charges to the probation officer otherwise he might end up going to jail. Patient also reportedly have a mood swings, irritability, agitation and initially stated he does not want to go to inpatient psychiatric hospitalization because he worried about going to the jail without paying his probation officer for his court charges. Patient has no previous acute psychiatric hospitalization or suicide attempts. Patient is in agreement, for inpatient psychiatric hospitalization but does not want to sign voluntarily until he discussed  with his mother who will be coming into the hospital this afternoon. Patient endorses smoking marijuana but denied using opiates, amphetamines which were positive in urine drug screen. Patient also stated his mother given cough syrup. He smoked marijuana about 2 weeks ago. [facility] and was diagnosed with ADHD and bipolar disorder in the past. Patient is currently under cardiac monitoring for second degree AV block. Patient urine drug screen was positive for amphetamines, benzos, opiates, and marijuana.  Past Psychiatric History: ADHD, Bipolar disorder as out patient at [facility] as a child. patient has no previous acute psychiatric hospitalization.   Risk to Self: Is patient at risk for suicide?: Yes Risk to Others:   Prior Inpatient Therapy:   Prior Outpatient Therapy:    Past Medical History:  Past Medical History  Diagnosis Date  . Asthma   . Anxiety   . ADHD (attention deficit hyperactivity disorder)   . Depression   . 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 Rayann Heman - no PPM indicated at the time; AV block due to increased vagal tone     Past Surgical History  Procedure Laterality Date  . No past surgeries     Family History: History reviewed. No pertinent family history. Family Psychiatric  History: not significant  Social History:  History  Alcohol Use No     History  Drug Use No    Social History   Social History  . Marital Status: Single    Spouse Name: N/A  . Number of Children: N/A  . Years of Education: N/A   Social History Main Topics  .  Smoking status: Current Every Day Smoker -- 0.25 packs/day for 4 years    Types: Cigarettes  . Smokeless tobacco: Never Used  . Alcohol Use: No  . Drug Use: No  . Sexual Activity: Yes   Warren Topics Concern  . None   Social History Narrative   Lives in [place] with  Mother.  Recently employed at [employer].   Additional  Social History:    Allergies:  No Known Allergies  Labs:  Results for orders placed or performed during the hospital encounter of 05/06/15 (from the past 48 hour(s))  Comprehensive metabolic panel     Status: None   Collection Time: 05/06/15  9:42 PM  Result Value Ref Range   Sodium 140 135 - 145 mmol/L   Potassium 3.8 3.5 - 5.1 mmol/L   Chloride 107 101 - 111 mmol/L   CO2 24 22 - 32 mmol/L   Glucose, Bld 95 65 - 99 mg/dL   BUN 10 6 - 20 mg/dL   Creatinine, Ser 1.13 0.61 - 1.24 mg/dL   Calcium 9.5 8.9 - 10.3 mg/dL   Total Protein 7.4 6.5 - 8.1 g/dL   Albumin 4.2 3.5 - 5.0 g/dL   AST 33 15 - 41 U/L   ALT 40 17 - 63 U/L   Alkaline Phosphatase 74 38 - 126 U/L   Total Bilirubin 0.5 0.3 - 1.2 mg/dL   GFR calc non Af Amer >60 >60 mL/min   GFR calc Af Amer >60 >60 mL/min    Comment: (NOTE) The eGFR has been calculated using the CKD EPI equation. This calculation has not been validated in all clinical situations. eGFR's persistently <60 mL/min signify possible Chronic Kidney Disease.    Anion gap 9 5 - 15  CBC with Differential     Status: None   Collection Time: 05/06/15  9:42 PM  Result Value Ref Range   WBC 6.8 4.0 - 10.5 K/uL   RBC 4.97 4.22 - 5.81 MIL/uL   Hemoglobin 14.2 13.0 - 17.0 g/dL   HCT 41.8 39.0 - 52.0 %   MCV 84.1 78.0 - 100.0 fL   MCH 28.6 26.0 - 34.0 pg   MCHC 34.0 30.0 - 36.0 g/dL   RDW 13.9 11.5 - 15.5 %   Platelets 175 150 - 400 K/uL   Neutrophils Relative % 51 %   Neutro Abs 3.5 1.7 - 7.7 K/uL   Lymphocytes Relative 40 %   Lymphs Abs 2.8 0.7 - 4.0 K/uL   Monocytes Relative 8 %   Monocytes Absolute 0.5 0.1 - 1.0 K/uL   Eosinophils Relative 1 %   Eosinophils Absolute 0.1 0.0 - 0.7 K/uL   Basophils Relative 0 %   Basophils Absolute 0.0 0.0 - 0.1 K/uL  Salicylate level     Status: None   Collection Time: 05/06/15  9:43 PM  Result Value Ref Range   Salicylate Lvl <1.9 2.8 - 30.0 mg/dL  Acetaminophen level     Status: Abnormal   Collection Time:  05/06/15  9:43 PM  Result Value Ref Range   Acetaminophen (Tylenol), Serum <10 (L) 10 - 30 ug/mL    Comment:        THERAPEUTIC CONCENTRATIONS VARY SIGNIFICANTLY. A RANGE OF 10-30 ug/mL MAY BE AN EFFECTIVE CONCENTRATION FOR MANY PATIENTS. HOWEVER, SOME ARE BEST TREATED AT CONCENTRATIONS OUTSIDE THIS RANGE. ACETAMINOPHEN CONCENTRATIONS >150 ug/mL AT 4 HOURS AFTER INGESTION AND >50 ug/mL AT 12 HOURS AFTER INGESTION ARE OFTEN ASSOCIATED WITH TOXIC REACTIONS.  Ethanol     Status: None   Collection Time: 05/06/15  9:43 PM  Result Value Ref Range   Alcohol, Ethyl (B) <5 <5 mg/dL    Comment:        LOWEST DETECTABLE LIMIT FOR SERUM ALCOHOL IS 5 mg/dL FOR MEDICAL PURPOSES ONLY   Urinalysis, Routine w reflex microscopic     Status: Abnormal   Collection Time: 05/06/15 11:26 PM  Result Value Ref Range   Color, Urine AMBER (A) YELLOW    Comment: BIOCHEMICALS MAY BE AFFECTED BY COLOR   APPearance TURBID (A) CLEAR   Specific Gravity, Urine 1.029 1.005 - 1.030   pH 6.0 5.0 - 8.0   Glucose, UA NEGATIVE NEGATIVE mg/dL   Hgb urine dipstick NEGATIVE NEGATIVE   Bilirubin Urine SMALL (A) NEGATIVE   Ketones, ur NEGATIVE NEGATIVE mg/dL   Protein, ur NEGATIVE NEGATIVE mg/dL   Nitrite NEGATIVE NEGATIVE   Leukocytes, UA SMALL (A) NEGATIVE  Urine rapid drug screen (hosp performed)     Status: Abnormal   Collection Time: 05/06/15 11:26 PM  Result Value Ref Range   Opiates POSITIVE (A) NONE DETECTED   Cocaine NONE DETECTED NONE DETECTED   Benzodiazepines POSITIVE (A) NONE DETECTED   Amphetamines POSITIVE (A) NONE DETECTED   Tetrahydrocannabinol POSITIVE (A) NONE DETECTED   Barbiturates NONE DETECTED NONE DETECTED    Comment:        DRUG SCREEN FOR MEDICAL PURPOSES ONLY.  IF CONFIRMATION IS NEEDED FOR ANY PURPOSE, NOTIFY LAB WITHIN 5 DAYS.        LOWEST DETECTABLE LIMITS FOR URINE DRUG SCREEN Drug Class       Cutoff (ng/mL) Amphetamine      1000 Barbiturate      200 Benzodiazepine    948 Tricyclics       546 Opiates          300 Cocaine          300 THC              50   Urine microscopic-add on     Status: Abnormal   Collection Time: 05/06/15 11:26 PM  Result Value Ref Range   Squamous Epithelial / LPF NONE SEEN NONE SEEN   WBC, UA 0-5 0 - 5 WBC/hpf   RBC / HPF NONE SEEN 0 - 5 RBC/hpf   Bacteria, UA MANY (A) NONE SEEN   Urine-Warren AMORPHOUS URATES/PHOSPHATES   Blood gas, venous     Status: Abnormal   Collection Time: 05/07/15 12:35 AM  Result Value Ref Range   FIO2 0.21    pH, Ven 7.335 (H) 7.250 - 7.300   pCO2, Ven 48.0 45.0 - 50.0 mmHg   pO2, Ven 68.8 (H) 31.0 - 45.0 mmHg   Bicarbonate 24.9 (H) 20.0 - 24.0 mEq/L   TCO2 22.1 0 - 100 mmol/L   Acid-base deficit 1.0 0.0 - 2.0 mmol/L   O2 Saturation 92.8 %   Patient temperature 98.6    Collection site VEIN    Drawn by COLLECTED BY LABORATORY    Sample type VENOUS   D-dimer, quantitative (not at [facility])     Status: None   Collection Time: 05/07/15  6:00 AM  Result Value Ref Range   D-Dimer, Quant <0.27 0.00 - 0.50 ug/mL-FEU    Comment: (NOTE) At the manufacturer cut-off of 0.50 ug/mL FEU, this assay has been documented to exclude PE with a sensitivity and negative predictive value of 97 to 99%.  At this time, this assay  has not been approved by the FDA to exclude DVT/VTE. Results should be correlated with clinical presentation.   Troponin I (q 6hr x 3)     Status: None   Collection Time: 05/07/15  1:15 PM  Result Value Ref Range   Troponin I <0.03 <0.031 ng/mL    Comment:        NO INDICATION OF MYOCARDIAL INJURY.   Basic metabolic panel     Status: Abnormal   Collection Time: 05/07/15  2:29 PM  Result Value Ref Range   Sodium 138 135 - 145 mmol/L   Potassium 3.9 3.5 - 5.1 mmol/L   Chloride 106 101 - 111 mmol/L   CO2 23 22 - 32 mmol/L   Glucose, Bld 105 (H) 65 - 99 mg/dL   BUN 9 6 - 20 mg/dL   Creatinine, Ser 1.17 0.61 - 1.24 mg/dL   Calcium 9.1 8.9 - 10.3 mg/dL   GFR calc non Af Amer >60  >60 mL/min   GFR calc Af Amer >60 >60 mL/min    Comment: (NOTE) The eGFR has been calculated using the CKD EPI equation. This calculation has not been validated in all clinical situations. eGFR's persistently <60 mL/min signify possible Chronic Kidney Disease.    Anion gap 9 5 - 15  Magnesium     Status: None   Collection Time: 05/07/15  2:29 PM  Result Value Ref Range   Magnesium 1.8 1.7 - 2.4 mg/dL  Glucose, capillary     Status: None   Collection Time: 05/07/15  3:21 PM  Result Value Ref Range   Glucose-Capillary 91 65 - 99 mg/dL  MRSA PCR Screening     Status: None   Collection Time: 05/07/15  5:31 PM  Result Value Ref Range   MRSA by PCR NEGATIVE NEGATIVE    Comment:        The GeneXpert MRSA Assay (FDA approved for NASAL specimens only), is one component of a comprehensive MRSA colonization surveillance program. It is not intended to diagnose MRSA infection nor to guide or monitor treatment for MRSA infections.   Troponin I (q 6hr x 3)     Status: None   Collection Time: 05/07/15  8:20 PM  Result Value Ref Range   Troponin I <0.03 <0.031 ng/mL    Comment:        NO INDICATION OF MYOCARDIAL INJURY.   Troponin I (q 6hr x 3)     Status: None   Collection Time: 05/08/15  6:13 AM  Result Value Ref Range   Troponin I <0.03 <0.031 ng/mL    Comment:        NO INDICATION OF MYOCARDIAL INJURY.   Basic metabolic panel     Status: Abnormal   Collection Time: 05/08/15  6:13 AM  Result Value Ref Range   Sodium 141 135 - 145 mmol/L   Potassium 4.0 3.5 - 5.1 mmol/L   Chloride 110 101 - 111 mmol/L   CO2 17 (L) 22 - 32 mmol/L   Glucose, Bld 100 (H) 65 - 99 mg/dL   BUN 9 6 - 20 mg/dL   Creatinine, Ser 1.24 0.61 - 1.24 mg/dL   Calcium 9.1 8.9 - 10.3 mg/dL   GFR calc non Af Amer >60 >60 mL/min   GFR calc Af Amer >60 >60 mL/min    Comment: (NOTE) The eGFR has been calculated using the CKD EPI equation. This calculation has not been validated in all clinical  situations. eGFR's persistently <60 mL/min signify  possible Chronic Kidney Disease.    Anion gap 14 5 - 15    Current Facility-Administered Medications  Medication Dose Route Frequency Provider Last Rate Last Dose  . antiseptic oral rinse (CPC / CETYLPYRIDINIUM CHLORIDE 0.05%) solution 7 mL  7 mL Mouth Rinse BID Rondell Charmayne Sheer, MD   7 mL at 05/07/15 1000  . atropine injection 0.4 mg  0.4 mg Intravenous Once Elmarie Shiley, MD   Stopped at 05/07/15 1500  . [START ON 05/09/2015] enoxaparin (LOVENOX) injection 60 mg  60 mg Subcutaneous Q24H Berton Mount, RPH      . ipratropium-albuterol (DUONEB) 0.5-2.5 (3) MG/3ML nebulizer solution 3 mL  3 mL Nebulization Q2H PRN Rondell A Tamala Julian, MD      . pneumococcal 23 valent vaccine (PNU-IMMUNE) injection 0.5 mL  0.5 mL Intramuscular Tomorrow-1000 Rondell A Tamala Julian, MD   0.5 mL at 05/08/15 2446    Musculoskeletal: Strength & Muscle Tone: within normal limits Gait & Station: normal Patient leans: N/A  Psychiatric Specialty Exam: ROS endorses depression, helplessness, isolated, stresses about legal charges and financial difficulties and feels no one loved him.  No Fever-chills, No Headache, No changes with Vision or hearing, reports vertigo No problems swallowing food or Liquids, No Chest pain, Cough or Shortness of Breath, No Abdominal pain, No Nausea or Vommitting, Bowel movements are regular, No Blood in stool or Urine, No dysuria, No new skin rashes or bruises, No new joints pains-aches,  No new weakness, tingling, numbness in any extremity, No recent weight gain or loss, No polyuria, polydypsia or polyphagia,  A full 10 point Review of Systems was done, except as stated above, all Warren Review of Systems were negative.  Blood pressure 126/74, pulse 44, temperature 97.1 F (36.2 C), temperature source Oral, resp. rate 22, height '6\' 1"'  (1.854 m), weight 125.9 kg (277 lb 9 oz), SpO2 100 %.Body mass index is 36.63 kg/(m^2).  General  Appearance: Casual  Eye Contact::  Good  Speech:  Clear and Coherent  Volume:  Decreased  Mood:  Anxious and Depressed  Affect:  Appropriate and Congruent  Thought Process:  Coherent and Goal Directed  Orientation:  Full (Time, Place, and Person)  Thought Content:  Rumination  Suicidal Thoughts:  Yes.  with intent/plan  Homicidal Thoughts:  No  Memory:  Immediate;   Good Recent;   Fair Remote;   Fair  Judgement:  Impaired  Insight:  Fair  Psychomotor Activity:  Decreased  Concentration:  Fair  Recall:  Good  Fund of Knowledge:Good  Language: Good  Akathisia:  Negative  Handed:  Right  AIMS (if indicated):     Assets:  Communication Skills Desire for Improvement Housing Leisure Time Resilience Social Support Talents/Skills Transportation Vocational/Educational  ADL's:  Intact  Cognition: WNL  Sleep:      Treatment Plan Summary: Patient presented to [facility] with bipolar disorder with most recent episode depression, status post suicidal attempt by intentional overdose and polysubstance abuse and legal problems. Patient reportedly off psychiatric medication over 5 months secondary to lack of medical insurance.   Safety concerns: Chief Technology Officer as patient presented with status post suicidal attempt  Psychiatric social service reported she can obtain orange card for him which may help him Patient also reported his mother has been working to get health insurance on her insurance from work  Patient provided verbal agreement/consent to psychiatric LCSW and this provider to contact his probation officer Mrs. Gwyndolyn Saxon.  Patient benefit from mood stabilizers  when medically cleared  Daily contact with patient to assess and evaluate symptoms and progress in treatment and Medication management    Disposition: Psychiatric LCSW has been working on inpatient placement and waiting for medical clearance  Recommend psychiatric Inpatient admission when  medically cleared. Supportive therapy provided about ongoing stressors.  Warren Mcbride., MD 05/08/2015 12:10 PM

## 2015-05-08 NOTE — Progress Notes (Signed)
PROGRESS NOTE    Warren Mcbride  ZOX:[id number] DOB: 1995/05/15 DOA: 05/06/2015 PCP: No primary care provider on file.  Outpatient Specialists: Dr Johney FrameAllred.     Brief Narrative:  Warren Mcbride is a 20 y.o. male with medical history significant of asthma, anxiety, depression, ADHD, second-degree  1 AV block; who presents after overdosing on Xanax.   Assessment & Plan:   Principal Problem:   Overdose Active Problems:   Tachypnea   History of asthma   Blurred vision   First degree AV block  1-Overdose; intentional, suicidal; He report taking trazodone and xanax. Psych consulted. Awaiting evaluation.  Sitter at bedside.   2-History of AV block Mobitz 1; Pause on telemetry  EKG repeated.  Had 2.04 and 3 second pauses, cardiology consulted. Appreciate evaluation.  Atropine and external pacemaker as needed.  HR in the 50. No further episode reported. Will follow cardio recommendation.   Polysubstance abuse: Patient's UDS was positive for amphetamines, benzos, opiates, and marijuana. Patient appears to have prescriptions for Adderall, hydrocodone, and Xanax. - Will likely need referral for treatment of underlying psych issues and detox - Home medications held currently, defer to psych resumption of adderall  Tachypnea with history of asthma: - Nasal cannula oxygen and keep O2 sats greater than 92%  - DuoNeb's prn sob or wheezing - D-dimer negative.      DVT prophylaxis: lovenox Code Status: full code.  Family Communication: care discussed with mother over phone 4-26 Disposition Plan: awaiting Psych evaluation. And cardiology    Consultants:   Psych  Cardiology   Procedures:   none  Antimicrobials:    none   Subjective: He is alert, was walking early on the hall.  Denies pain.   Objective: Filed Vitals:   05/07/15 2200 05/08/15 0000 05/08/15 0400 05/08/15 0616  BP: 104/42 130/56 103/46   Pulse:   52   Temp:  97.9 F (36.6 C)  98.7 F (37.1 C)    TempSrc:  Oral  Oral  Resp: 18 22 24    Height:      Weight:      SpO2:  94% 95%     Intake/Output Summary (Last 24 hours) at 05/08/15 0759 Last data filed at 05/08/15 [id number]  Gross per 24 hour  Intake   2540 ml  Output      0 ml  Net   2540 ml   Filed Weights   05/07/15 0815 05/07/15 1628  Weight: 108.863 kg (240 lb) 125.9 kg (277 lb 9 oz)    Examination:  General exam: alert, follows command.  Respiratory system: Clear to auscultation. Respiratory effort normal. Cardiovascular system: S1 & S2 heard, RRR. No JVD, murmurs, rubs, gallops or clicks. No pedal edema. Gastrointestinal system: Abdomen is nondistended, soft and nontender. No organomegaly or masses felt. Normal bowel sounds heard. Central nervous system: Alert and oriented. No focal neurological deficits. Extremities: Symmetric 5 x 5 power.     Data Reviewed: I have personally reviewed following labs and imaging studies  CBC:  Recent Labs Lab 05/06/15 2142  WBC 6.8  NEUTROABS 3.5  HGB 14.2  HCT 41.8  MCV 84.1  PLT 175   Basic Metabolic Panel:  Recent Labs Lab 05/06/15 2142 05/07/15 1429  NA 140 138  K 3.8 3.9  CL 107 106  CO2 24 23  GLUCOSE 95 105*  BUN 10 9  CREATININE 1.13 1.17  CALCIUM 9.5 9.1  MG  --  1.8   GFR: Estimated Creatinine Clearance: 138.9 mL/min (by  C-G formula based on Cr of 1.17). Liver Function Tests:  Recent Labs Lab 05/06/15 2142  AST 33  ALT 40  ALKPHOS 74  BILITOT 0.5  PROT 7.4  ALBUMIN 4.2   No results for input(s): LIPASE, AMYLASE in the last 168 hours. No results for input(s): AMMONIA in the last 168 hours. Coagulation Profile: No results for input(s): INR, PROTIME in the last 168 hours. Cardiac Enzymes:  Recent Labs Lab 05/07/15 1315 05/07/15 2020 05/08/15 0613  TROPONINI <0.03 <0.03 <0.03   BNP (last 3 results) No results for input(s): PROBNP in the last 8760 hours. HbA1C: No results for input(s): HGBA1C in the last 72 hours. CBG:  Recent  Labs Lab 05/07/15 1521  GLUCAP 91   Lipid Profile: No results for input(s): CHOL, HDL, LDLCALC, TRIG, CHOLHDL, LDLDIRECT in the last 72 hours. Thyroid Function Tests: No results for input(s): TSH, T4TOTAL, FREET4, T3FREE, THYROIDAB in the last 72 hours. Anemia Panel: No results for input(s): VITAMINB12, FOLATE, FERRITIN, TIBC, IRON, RETICCTPCT in the last 72 hours. Urine analysis:    Component Value Date/Time   COLORURINE AMBER* 05/06/2015 2326   APPEARANCEUR TURBID* 05/06/2015 2326   LABSPEC 1.029 05/06/2015 2326   PHURINE 6.0 05/06/2015 2326   GLUCOSEU NEGATIVE 05/06/2015 2326   HGBUR NEGATIVE 05/06/2015 2326   BILIRUBINUR SMALL* 05/06/2015 2326   KETONESUR NEGATIVE 05/06/2015 2326   PROTEINUR NEGATIVE 05/06/2015 2326   UROBILINOGEN 0.2 02/16/2014 1313   NITRITE NEGATIVE 05/06/2015 2326   LEUKOCYTESUR SMALL* 05/06/2015 2326   Sepsis Labs: (procalcitonin:4,lacticidven:4)  ) Recent Results (from the past 240 hour(s))  MRSA PCR Screening     Status: None   Collection Time: 05/07/15  5:31 PM  Result Value Ref Range Status   MRSA by PCR NEGATIVE NEGATIVE Final    Comment:        The GeneXpert MRSA Assay (FDA approved for NASAL specimens only), is one component of a comprehensive MRSA colonization surveillance program. It is not intended to diagnose MRSA infection nor to guide or monitor treatment for MRSA infections.          Radiology Studies: Dg Chest Port 1 View  05/07/2015  CLINICAL DATA:  Unresponsive.  Overdose. EXAM: PORTABLE CHEST 1 VIEW COMPARISON:  02/20/2015 FINDINGS: Normal heart size and mediastinal contours. No acute infiltrate or edema. No effusion or pneumothorax. No acute osseous findings. IMPRESSION: Negative portable chest. Electronically Signed   By: Marnee Spring M.D.   On: 05/07/2015 01:23        Scheduled Meds: . antiseptic oral rinse  7 mL Mouth Rinse BID  . atropine  0.4 mg Intravenous Once  . enoxaparin (LOVENOX)  injection  30 mg Subcutaneous Q24H  . pneumococcal 23 valent vaccine  0.5 mL Intramuscular Tomorrow-1000   Continuous Infusions: . sodium chloride 75 mL/hr at 05/07/15 1556     LOS: 1 day    Time spent: 35 minutes.     Alba Cory, MD [facility] Pager [phone]  If 7PM-7AM, please contact night-coverage [email or web address] Password [employer] [facility] 05/08/2015, 7:59 AM

## 2015-05-09 ENCOUNTER — Inpatient Hospital Stay (HOSPITAL_COMMUNITY): Admit: 2015-05-09 | Payer: Self-pay

## 2015-05-09 DIAGNOSIS — I440 Atrioventricular block, first degree: Secondary | ICD-10-CM

## 2015-05-09 DIAGNOSIS — F3130 Bipolar disorder, current episode depressed, mild or moderate severity, unspecified: Secondary | ICD-10-CM

## 2015-05-09 LAB — BASIC METABOLIC PANEL
Anion gap: 9 (ref 5–15)
BUN: 11 mg/dL (ref 6–20)
CO2: 24 mmol/L (ref 22–32)
Calcium: 9.2 mg/dL (ref 8.9–10.3)
Chloride: 109 mmol/L (ref 101–111)
Creatinine, Ser: 1.06 mg/dL (ref 0.61–1.24)
GFR calc Af Amer: >60 mL/min (ref >60)
GFR calc non Af Amer: >60 mL/min (ref >60)
Glucose, Bld: 100 mg/dL — ABNORMAL HIGH (ref 65–99)
Potassium: 4 mmol/L (ref 3.5–5.1)
Sodium: 142 mmol/L (ref 135–145)

## 2015-05-09 NOTE — Progress Notes (Signed)
Disposition:   Accepted to [facility] after 9:30pm tonight. [facility] [phone] option 1 then option 3.  Patient is going to 303 bed 1 Attending: Dr. Dub MikesLugo Report: [phone]   Deretha EmoryHannah Caylin Nass LCSW, MSW Clinical Social Work: [facility] [employer] [phone]

## 2015-05-09 NOTE — Progress Notes (Signed)
Pt alert x4 , [facility], will continue to monitor.

## 2015-05-09 NOTE — Progress Notes (Signed)
LCSW discussed case with [facility] CSW for Dispo [facility], [facility]) see if appropriate per progression meeting reporting medically stable. Patient under IVC.   Will await clinical review and follow up with other referrals. Patient must be MEDICALLY STABLE and documented in note or DC summary.  LCSW will continue to follow.  Deretha EmoryHannah Janilah Hojnacki LCSW, MSW Clinical Social Work: System [employer] [phone]

## 2015-05-09 NOTE — Progress Notes (Addendum)
Discussed patient with Dr Rennis GoldenHilty by phone -  Pt with known Mobitz I heart block and asymptomatic (per Dr Rennis GoldenHilty and notes) resting bradycardia.  Without symptoms related to bradycardia, he has no indication for PPM implant or further EP workup. Would recommend ambulation and assessment of HR with activity prior to discharge Follow up with EP as needed on an outpatient basis for symptoms of bradycardia.   Please call if full EP consult is requested  Gypsy BalsamAmber Seiler, NP 05/09/2015 9:11 AM

## 2015-05-09 NOTE — Discharge Summary (Signed)
Physician Discharge Summary  Warren Mcbride QIO:[id number] DOB: 1995-10-29 DOA: 05/06/2015  PCP: No primary care provider on file.  Admit date: 05/06/2015 Discharge date: 05/09/2015  Time spent: 35 minutes  Recommendations for Outpatient Follow-up:  Follow with Dr Tillie RungAlred as needed.  Avoid AV block agents. Sedatives.   Discharge Diagnoses:    Overdose, intentional    Bipolar I disorder, most recent episode depressed (HCC)   Tachypnea   History of asthma   Blurred vision   First degree AV block   Overdose of benzodiazepine   Sinus pause   Polysubstance dependence including opioid type drug, episodic abuse (HCC)   Discharge Condition: stable.   Diet recommendation: regular.   Filed Weights   05/07/15 0815 05/07/15 1628  Weight: 108.863 kg (240 lb) 125.9 kg (277 lb 9 oz)    History of present illness:  Warren Mcbride is a 20 y.o. male with medical history significant of asthma, anxiety, depression, ADHD, second-degree orbits 1 AV block; who presents after overdosing on Xanax. Patient history was obtained from report as patient is somnolent and difficult to arouse. Reported taking five 2mg  Xanax 2 nights ago,four 2 mg Xanax yesterday morning, and additional four 2 mg yesterday evening. Patient was complaining of blurred vision and lethargy upon EMS arrival. This appears be patient's first suicide attempt.   Hospital Course:   Principal Problem:  Overdose Active Problems:  Tachypnea  History of asthma  Blurred vision  First degree AV block   1-Overdose; intentional, suicidal; He report taking trazodone and xanax. Psych consulted. Needs inpatient psych facility  Sitter at bedside.   2-History of AV block Mobitz 1; Pause on telemetry , bradycardia Had 2.04 and 3 second pauses, cardiology consulted. Appreciate evaluation. no further evaluation per cardiology  Asymptomatic bradycardia. [facility] No further evaluation needed per cardiology   Polysubstance  abuse: Patient's UDS was positive for amphetamines, benzos, opiates, and marijuana. Patient appears to have prescriptions for Adderall, hydrocodone, and Xanax. - Will likely need referral for treatment of underlying psych issues and detox   Tachypnea with history of asthma: - Nasal cannula oxygen and keep O2 sats greater than 92%  - DuoNeb's prn sob or wheezing - D-dimer negative.  -resolved.   Procedures:  none  Consultations:  Cardiology  Discharge Exam: Filed Vitals:   05/08/15 1802 05/09/15 0608  BP: 119/53 96/43  Pulse: 42 40  Temp: 97.7 F (36.5 C) 97.6 F (36.4 C)  Resp: 20 20    General: NAD Cardiovascular: S 1, S 2 RRR Respiratory: CTA  Discharge Instructions   Discharge Instructions    Diet - low sodium heart healthy    Complete by:  As directed      Increase activity slowly    Complete by:  As directed           Current Discharge Medication List    CONTINUE these medications which have NOT CHANGED   Details  ibuprofen (ADVIL,MOTRIN) 800 MG tablet Take 1 tablet (800 mg total) by mouth 3 (three) times daily. Qty: 21 tablet, Refills: 0    ipratropium-albuterol (DUONEB) 0.5-2.5 (3) MG/3ML SOLN Take 3 mLs by nebulization every 2 (two) hours as needed. Qty: 360 mL, Refills: 0      STOP taking these medications     alprazolam (XANAX) 2 MG tablet      lamoTRIgine (LAMICTAL) 100 MG tablet      amphetamine-dextroamphetamine (ADDERALL XR) 30 MG 24 hr capsule      cyclobenzaprine (  FLEXERIL) 10 MG tablet      HYDROcodone-acetaminophen (NORCO/VICODIN) 5-325 MG tablet      ondansetron (ZOFRAN ODT) 4 MG disintegrating tablet      traZODone (DESYREL) 50 MG tablet        No Known Allergies Follow-up Information    Follow up with Hillis Range, MD.   Specialty:  Cardiology   Contact information:   [address] [place] [address] [phone]        The results of significant diagnostics from this hospitalization (including  imaging, microbiology, ancillary and laboratory) are listed below for reference.    Significant Diagnostic Studies: Dg Chest Port 1 View  05/07/2015  CLINICAL DATA:  Unresponsive.  Overdose. EXAM: PORTABLE CHEST 1 VIEW COMPARISON:  02/20/2015 FINDINGS: Normal heart size and mediastinal contours. No acute infiltrate or edema. No effusion or pneumothorax. No acute osseous findings. IMPRESSION: Negative portable chest. Electronically Signed   By: Marnee Spring M.D.   On: 05/07/2015 01:23    Microbiology: Recent Results (from the past 240 hour(s))  MRSA PCR Screening     Status: None   Collection Time: 05/07/15  5:31 PM  Result Value Ref Range Status   MRSA by PCR NEGATIVE NEGATIVE Final    Comment:        The GeneXpert MRSA Assay (FDA approved for NASAL specimens only), is one component of a comprehensive MRSA colonization surveillance program. It is not intended to diagnose MRSA infection nor to guide or monitor treatment for MRSA infections.      Labs: Basic Metabolic Panel:  Recent Labs Lab 05/06/15 2142 05/07/15 1429 05/08/15 0613 05/09/15 0501  NA 140 138 141 142  K 3.8 3.9 4.0 4.0  CL 107 106 110 109  CO2 24 23 17* 24  GLUCOSE 95 105* 100* 100*  BUN CREATININE 1.13 1.17 1.24 1.06  CALCIUM 9.5 9.1 9.1 9.2  MG  --  1.8  --   --    Liver Function Tests:  Recent Labs Lab 05/06/15 2142  AST 33  ALT 40  ALKPHOS 74  BILITOT 0.5  PROT 7.4  ALBUMIN 4.2   No results for input(s): LIPASE, AMYLASE in the last 168 hours. No results for input(s): AMMONIA in the last 168 hours. CBC:  Recent Labs Lab 05/06/15 2142  WBC 6.8  NEUTROABS 3.5  HGB 14.2  HCT 41.8  MCV 84.1  PLT 175   Cardiac Enzymes:  Recent Labs Lab 05/07/15 1315 05/07/15 2020 05/08/15 0613  TROPONINI <0.03 <0.03 <0.03   BNP: BNP (last 3 results) No results for input(s): BNP in the last 8760 hours.  ProBNP (last 3 results) No results for input(s): PROBNP in the last 8760  hours.  CBG:  Recent Labs Lab 05/07/15 1521  GLUCAP 91       Signed:  Hartley Barefoot A MD.  [facility] 05/09/2015, 10:30 AM

## 2015-05-09 NOTE — Progress Notes (Addendum)
Patient is medically stable for discharge: Inpatient Psychiatric Care recommended Patient under IVC  Referrals faxed to:  [facility]  Referrals called for review within [facility]:  [facility]  (patient accepted Bed:  305 Bed 1 to Dr. Dub MikesLugo with RN calling report: [phone])   [facility]   LCSW will follow up with referrals and assist with disposition once [facility].  Deretha EmoryHannah Sherrod Toothman LCSW, MSW Clinical Social Work: [facility] [employer] [phone]

## 2015-05-09 NOTE — Progress Notes (Signed)
[facility] at [phone] and [phone]. I was unsuccessful made [facility] aware, Pt alert and stable. Will continue to monitor.

## 2015-05-10 ENCOUNTER — Encounter (HOSPITAL_COMMUNITY): Payer: Self-pay

## 2015-05-10 ENCOUNTER — Inpatient Hospital Stay (HOSPITAL_COMMUNITY)
Admission: AD | Admit: 2015-05-10 | Discharge: 2015-05-12 | DRG: 885 | Disposition: A | Discharge status: Home / Self Care | Payer: Federal, State, Local not specified - Other | Source: Intra-hospital | Attending: Psychiatry | Admitting: Psychiatry

## 2015-05-10 DIAGNOSIS — F3130 Bipolar disorder, current episode depressed, mild or moderate severity, unspecified: Secondary | ICD-10-CM | POA: Diagnosis not present

## 2015-05-10 DIAGNOSIS — F17210 Nicotine dependence, cigarettes, uncomplicated: Secondary | ICD-10-CM | POA: Diagnosis present

## 2015-05-10 DIAGNOSIS — G4700 Insomnia, unspecified: Secondary | ICD-10-CM | POA: Diagnosis present

## 2015-05-10 DIAGNOSIS — F319 Bipolar disorder, unspecified: Secondary | ICD-10-CM | POA: Diagnosis present

## 2015-05-10 DIAGNOSIS — Z915 Personal history of self-harm: Secondary | ICD-10-CM | POA: Diagnosis not present

## 2015-05-10 DIAGNOSIS — T1491 Suicide attempt: Secondary | ICD-10-CM | POA: Diagnosis not present

## 2015-05-10 DIAGNOSIS — F32A Depression, unspecified: Secondary | ICD-10-CM | POA: Diagnosis present

## 2015-05-10 DIAGNOSIS — F329 Major depressive disorder, single episode, unspecified: Secondary | ICD-10-CM | POA: Diagnosis present

## 2015-05-10 DIAGNOSIS — R45851 Suicidal ideations: Secondary | ICD-10-CM | POA: Diagnosis present

## 2015-05-10 DIAGNOSIS — T424X2A Poisoning by benzodiazepines, intentional self-harm, initial encounter: Secondary | ICD-10-CM | POA: Diagnosis not present

## 2015-05-10 MED ORDER — ALUM & MAG HYDROXIDE-SIMETH 200-200-20 MG/5ML PO SUSP: 30.0000 mL | ORAL | Status: DC | PRN

## 2015-05-10 MED ORDER — ACETAMINOPHEN 325 MG PO TABS: 650.0000 mg | ORAL_TABLET | Freq: Four times a day (QID) | ORAL | Status: DC | PRN

## 2015-05-10 MED ORDER — MAGNESIUM HYDROXIDE 400 MG/5ML PO SUSP: 30.0000 mL | Freq: Every day | ORAL | Status: DC | PRN

## 2015-05-10 MED ORDER — TRAZODONE HCL 50 MG PO TABS
50.0000 mg | ORAL_TABLET | Freq: Every evening | ORAL | Status: DC | PRN
  Administered 2015-05-10 – 2015-05-11 (×2): 50 mg via ORAL
  Filled 2015-05-10: qty 1
  Filled 2015-05-10: qty 14
  Filled 2015-05-10: qty 1

## 2015-05-10 MED ORDER — LAMOTRIGINE 25 MG PO TABS
25.0000 mg | ORAL_TABLET | Freq: Every day | ORAL | Status: DC
  Administered 2015-05-10 – 2015-05-12 (×3): 25 mg via ORAL
  Filled 2015-05-10 (×4): qty 1
  Filled 2015-05-10: qty 7
  Filled 2015-05-10 (×2): qty 1

## 2015-05-10 NOTE — Progress Notes (Signed)
Adult Psychoeducational Group Note  Date:  05/10/2015 Time:  9:11 PM  Group Topic/Focus:  Wrap-Up Group:   The focus of this group is to help patients review their daily goal of treatment and discuss progress on daily workbooks.  Participation Level:  Active  Participation Quality:  Appropriate and Attentive  Affect:  Appropriate  Cognitive:  Appropriate  Insight: Appropriate and Good  Engagement in Group:  Engaged  Modes of Intervention:  Discussion  Additional Comments:  Pt stated his goal was to get back on his meds. Pt stated one positive thing is that he got to see his mom today.   Warren Mcbride, Warren Mcbride 05/10/2015, 9:11 PM

## 2015-05-10 NOTE — Tx Team (Signed)
Initial Interdisciplinary Treatment Plan   PATIENT STRESSORS: Financial difficulties Health problems Medication change or noncompliance Substance abuse   PATIENT STRENGTHS: Ability for insight Average or above average intelligence General fund of knowledge Supportive family/friends   PROBLEM LIST: Problem List/Patient Goals Date to be addressed Date deferred Reason deferred Estimated date of resolution  depression 05/09/2015     Suicidal attempt 05/09/2015     ADHD 05/09/2015     "don't bottle things in and able to speak up" 04/19/2015                                    DISCHARGE CRITERIA:  Adequate post-discharge living arrangements Improved stabilization in mood, thinking, and/or behavior Medical problems require only outpatient monitoring  PRELIMINARY DISCHARGE PLAN: Placement in alternative living arrangements Return to previous living arrangement  PATIENT/FAMIILY INVOLVEMENT: This treatment plan has been presented to and reviewed with the patient, Warren Mcbride,  The patient and family have been given the opportunity to ask questions and make suggestions.  JEHU-APPIAH, Toshiyuki Fredell K 05/10/2015, 3:30 AM

## 2015-05-10 NOTE — Progress Notes (Signed)
Patient ID: Warren Mcbride, male   DOB: [birth year], 20 y.o.   MRN: [id number] Admission note: D:Patient is a Involuntary admission in no acute distress for depression and suicide attempt. Pt report he brought (9) 2 mg xanax off the street and OD on them on 05/06/15. Pt reports his main stressor is losing his medicaid after he turned 19 years. Pt is on probation for 18 months for violating previous probation.  A: Pt admitted to unit per protocol, skin assessment and belonging search done. No skin issues noted. Consent signed by pt. Pt educated on therapeutic milieu rules. Pt was introduced to milieu by nursing staff. Fall risk safety plan explained to the patient. 15 minutes checks started for safety. R: Pt was receptive to education. Writer offered support.

## 2015-05-10 NOTE — H&P (Signed)
Psychiatric Admission Assessment Adult  Patient Identification: Warren Mcbride MRN:  [id number] Date of Evaluation:  05/10/2015 Chief Complaint:  Bipolar 1 Disorder - Most Recent - Episode Depressed Principal Diagnosis: Bipolar I disorder, most recent episode depressed ([place]) Diagnosis:   Patient Active Problem List   Diagnosis Date Noted  . Depression [F32.9] 05/10/2015  . Bipolar I disorder, most recent episode depressed ([place]) [F31.30] 05/08/2015  . Polysubstance dependence including opioid type drug, episodic abuse ([place]) [F11.20, F19.20] 05/08/2015  . Overdose of benzodiazepine [T42.4X1A]   . Sinus pause [I45.5]   . Overdose [T50.901A] 05/07/2015  . Tachypnea [R06.82] 05/07/2015  . History of asthma [Z87.09] 05/07/2015  . Blurred vision [H53.8] 05/07/2015  . 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 Syncope [R55] 05/18/2013   History of Present Illness:Per consult Note-Warren Mcbride is a 20 y.o. male, seen, chart reviewed and case discussed with psych LCSW for the face-to-face psychiatric consultation and evaluation of increased symptoms of depression and status post suicidal attempt by taking intentional overdose of Xanax 2 mg5. Patient stated that he feels nobody love him, took medication from street with intention to go to sleep and not wake up. Reportedly patient mother was not at home during this episode, he reports being dictated for the incident. He also reportedly unable to access his outpatient psychiatrist and therapist secondary to no health insurance since November 2016. Patient reportedly working in [employer], want to pay his charges to the probation officer otherwise he might end up going to jail. Patient also reportedly have a mood swings, irritability, agitation and initially stated he does not want to go to inpatient psychiatric hospitalization because he worried about going to the jail without paying his probation officer for his court charges. Patient has no previous  acute psychiatric hospitalization or suicide attempts. Patient is in agreement, for inpatient psychiatric hospitalization but does not want to sign voluntarily until he discussed with his mother who will be coming into the hospital this afternoon. Patient endorses smoking marijuana but denied using opiates, amphetamines which were positive in urine drug screen. Patient also stated his mother given cough syrup. He smoked marijuana about 2 weeks ago. [facility] and was diagnosed with ADHD and bipolar disorder in the past. Patient is currently under cardiac monitoring for second degree AV block. Patient urine drug screen was positive for amphetamines, benzos, opiates, and marijuana.  On evaluation: Warren Mcbride is awake, alert and oriented X4 , found resting in bedroom. Patient validated information provided to the consulting LCSW note. Patient reports suicidal ideations however patient is able to contract for safety. Patient denies homicidal ideation. Denies auditory or visual hallucination and does not appear to be responding to internal stimuli.  Patient reports he was supposed to take Adderall, trazodone and Lamictal  Patient reports he is followed by MD. J.Jonalaadda. for the past year. effects.  States his depression 6/10. Support, encouragement and reassurance was provided.   Associated Signs/Symptoms: Depression Symptoms:  depressed mood, suicidal thoughts with specific plan, anxiety, (Hypo) Manic Symptoms:  Distractibility, Anxiety Symptoms:  Excessive Worry, Psychotic Symptoms:  Hallucinations: None PTSD Symptoms: Avoidance:  Decreased Interest/Participation Total Time spent with patient: 30 minutes  Past Psychiatric History: See Above   Is the patient at risk to self? Yes.    Has the patient been a risk to self in the past 6 months? Yes.    Has the patient been a risk to self within  the distant past? Yes.    Is  the patient a risk to others? No.  Has the patient been a risk to others in the past 6 months? No.  Has the patient been a risk to others within the distant past? No.   Prior Inpatient Therapy:   Prior Outpatient Therapy:    Alcohol Screening: 1. How often do you have a drink containing alcohol?: Monthly or less 2. How many drinks containing alcohol do you have on a typical day when you are drinking?: 1 or 2 3. How often do you have six or more drinks on one occasion?: Never Preliminary Score: 0 9. Have you or someone else been injured as a result of your drinking?: No 10. Has a relative or friend or a doctor or another health worker been concerned about your drinking or suggested you cut down?: No Alcohol Use Disorder Identification Test Final Score (AUDIT): 1 Brief Intervention: AUDIT score less than 7 or less-screening does not suggest unhealthy drinking-brief intervention not indicated Substance Abuse History in the last 12 months:  Yes.   Consequences of Substance Abuse: Withdrawal Symptoms:   None Previous Psychotropic Medications: yes Psychological Evaluations: yes Past Medical History:  Past Medical History  Diagnosis Date  . Asthma   . Anxiety   . ADHD (attention deficit hyperactivity disorder)   . Depression   . 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 Rayann Heman - no PPM indicated at the time; AV block due to increased vagal tone     Past Surgical History  Procedure Laterality Date  . No past surgeries     Family History: History reviewed. No pertinent family history. Family Psychiatric  History: Unknown Tobacco Screening: _0 ([phone])::1)@ Social History:  History  Alcohol Use No     History  Drug Use No    Additional Social History:                           Allergies:  No Known Allergies Lab Results:  Results for orders placed or performed during the hospital encounter of 05/06/15 (from the past 48 hour(s))  Basic metabolic  panel     Status: Abnormal   Collection Time: 05/09/15  5:01 AM  Result Value Ref Range   Sodium 142 135 - 145 mmol/L   Potassium 4.0 3.5 - 5.1 mmol/L   Chloride 109 101 - 111 mmol/L   CO2 24 22 - 32 mmol/L   Glucose, Bld 100 (H) 65 - 99 mg/dL   BUN 11 6 - 20 mg/dL   Creatinine, Ser 1.06 0.61 - 1.24 mg/dL   Calcium 9.2 8.9 - 10.3 mg/dL   GFR calc non Af Amer >60 >60 mL/min   GFR calc Af Amer >60 >60 mL/min    Comment: (NOTE) The eGFR has been calculated using the CKD EPI equation. This calculation has not been validated in all clinical situations. eGFR's persistently <60 mL/min signify possible Chronic Kidney Disease.    Anion gap 9 5 - 15    Blood Alcohol level:  Lab Results  Component Value Date   ETH <5 09/32/6712    Metabolic Disorder Labs:  No results found for: HGBA1C, MPG No results found for: PROLACTIN No results found for: CHOL, TRIG, HDL, CHOLHDL, VLDL, LDLCALC  Current Medications: Current Facility-Administered Medications  Medication Dose Route Frequency Provider Last Rate Last Dose  . acetaminophen (TYLENOL) tablet 650 mg  650 mg Oral  Q6H PRN Nicholaus Bloom, MD      . alum & mag hydroxide-simeth (MAALOX/MYLANTA) 200-200-20 MG/5ML suspension 30 mL  30 mL Oral Q4H PRN Nicholaus Bloom, MD      . magnesium hydroxide (MILK OF MAGNESIA) suspension 30 mL  30 mL Oral Daily PRN Nicholaus Bloom, MD      . traZODone (DESYREL) tablet 50 mg  50 mg Oral QHS PRN,MR X 1 Nicholaus Bloom, MD       PTA Medications: Prescriptions prior to admission  Medication Sig Dispense Refill Last Dose  . ibuprofen (ADVIL,MOTRIN) 800 MG tablet Take 1 tablet (800 mg total) by mouth 3 (three) times daily. (Patient taking differently: Take 800 mg by mouth every 6 (six) hours as needed for headache or moderate pain. ) 21 tablet 0 Unknown at Unknown time  . ipratropium-albuterol (DUONEB) 0.5-2.5 (3) MG/3ML SOLN Take 3 mLs by nebulization every 2 (two) hours as needed. (Patient taking differently: Take 3  mLs by nebulization every 2 (two) hours as needed (wheezing, shortness of breath). ) 360 mL 0 Unknown at Unknown time    Musculoskeletal: Strength & Muscle Tone: within normal limits Gait & Station: normal Patient leans: N/A  Psychiatric Specialty Exam: Physical Exam  Nursing note and vitals reviewed. Constitutional: He is oriented to person, place, and time. He appears well-developed.  HENT:  Head: Normocephalic.  Neck: Neck supple.  Respiratory: Breath sounds normal.  Musculoskeletal: Normal range of motion.  Neurological: He is oriented to person, place, and time.  Skin: Skin is warm and dry.  Psychiatric: He has a normal mood and affect. His behavior is normal. Judgment normal.    Review of Systems  Psychiatric/Behavioral: Positive for depression, suicidal ideas and substance abuse. Negative for hallucinations. The patient is not nervous/anxious.   All other systems reviewed and are negative.   Blood pressure 103/70, pulse 84, temperature 97.7 F (36.5 C), temperature source Oral, resp. rate 16, height 5' 10.5" (1.791 m), weight 124.739 kg (275 lb).Body mass index is 38.89 kg/(m^2).  General Appearance: Disheveled and Guarded  Eye Contact::  Good  Speech:  Clear and Coherent  Volume:  Normal  Mood:  Anxious and Depressed  Affect:  Congruent  Thought Process:  Logical  Orientation:  Full (Time, Place, and Person)  Thought Content:  Hallucinations: None  Suicidal Thoughts:  No  Homicidal Thoughts:  No  Memory:  Immediate;   Fair Recent;   Fair Remote;   Fair  Judgement:  Fair  Insight:  Fair  Psychomotor Activity:  Restlessness  Concentration:  Fair  Recall:  [employer] [place]: Fair  Akathisia:  No  Handed:  Right  AIMS (if indicated):     Assets:  Communication Skills Resilience Social Support  ADL's:  Intact  Cognition: WNL  Sleep:  Number of Hours: 5.5    I agree with current treatment plan on 05/10/2015, Patient seen face-to-face  for psychiatric evaluation follow-up, chart reviewed and case discussed with the MD Doyne Keel. Reviewed the information documented and agree with the treatment plan.  Of Note: Patient Dx with first degree AV block and Syncope episodes.   Treatment Plan Summary: Daily contact with patient to assess and evaluate symptoms and progress in treatment and Medication management Start  Lamictal 25 mg  for mood stabilization. With titration  Continue with Trazodone 50 mg PO X 1 repeat dose for insomnia Will continue to monitor vitals ,medication compliance and treatment side effects while patient is  here.  Reviewed labs Glucose 100 elevated ,BAL - 0, UDS+ positive for opiates, amphetamines, THC and benzodiazepines. CSW will start working on disposition.  Patient to participate in therapeutic milieu  Observation Level/Precautions:  15 minute checks  Laboratory:  CBC Chemistry Profile UDS UA  Psychotherapy:  Individual and group session  Medications:  See Above  Consultations: Psychiatry   Discharge Concerns:  Safety, stabilization, and risk of access to medication and medication stabilization   Estimated LOS: 0-0FHQR  Other:     I certify that inpatient services furnished can reasonably be expected to improve the patient's condition.    Derrill Center, NP 4/29/20173:51 PM

## 2015-05-10 NOTE — [facility] Group Notes (Signed)
[facility] Group Notes: (Clinical Social Work)   05/10/2015      Type of Therapy:  Group Therapy   Participation Level:  Did Not Attend despite MHT prompting   Tilley Faeth Grossman-Orr, LCSW 05/10/2015, 12:28 PM     

## 2015-05-10 NOTE — Plan of Care (Signed)
Problem: Diagnosis: Increased Risk For Suicide Attempt Goal: STG-Patient Will Comply With Medication Regime Outcome: Progressing Pt took his medications as ordered when offered. Denies adverse drug reactions when assessed.  Goal: STG-Patient will be able to identify reason for suicidal STG-Patient will be able to identify reason for suicidal ideation  Outcome: Progressing Pt denies SI when assessed. Verbally contracts for safety. No gestures of self harm behavior to report at this time.

## 2015-05-10 NOTE — [facility] Suicide Risk Assessment (Signed)
[facility]   Nursing information obtained from:   pt Demographic factors:   young adult, employed Current Mental Status:   depression with SA by OD Loss Factors:   legal issues, financial stressors Historical Factors:     Risk Reduction Factors:    denies hx of suicide attempts, living with mother  Total Time spent with patient: 1 hour Principal Problem: Bipolar I disorder, most recent episode depressed (HCC) Diagnosis:   Patient Active Problem List   Diagnosis Date Noted  . Depression [F32.9] 05/10/2015  . Bipolar I disorder, most recent episode depressed (HCC) [F31.30] 05/08/2015  . Polysubstance dependence including opioid type drug, episodic abuse (HCC) [F11.20, F19.20] 05/08/2015  . Overdose of benzodiazepine [T42.4X1A]   . Sinus pause [I45.5]   . Overdose [T50.901A] 05/07/2015  . Tachypnea [R06.82] 05/07/2015  . History of asthma [Z87.09] 05/07/2015  . Blurred vision [H53.8] 05/07/2015  . 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 Syncope [R55] 05/18/2013   Subjective Data: Pt states he has been off his psych meds since December when his medicaid ran out. He was taking Lamictal, Trazodone and Adderall. States meds were very effective but he has not been able to afford them. He has been depressed for several months and is not happy with life. He bought some Xanax off the street and OD in his first suicide attempt. He denies drug and alcohol use.   Continued Clinical Symptoms:  Alcohol Use Disorder Identification Test Final Score (AUDIT): 1 The "Alcohol Use Disorders Identification Test", Guidelines for Use in Primary Care, Second Edition.  World Science writerHealth Organization [facility](WHO). Score between 0-7:  no or low risk or alcohol related problems. Score between 8-15:  moderate risk of alcohol related problems. Score between 16-19:  high risk of alcohol related problems. Score 20 or above:  warrants further diagnostic evaluation for alcohol dependence and  treatment.   CLINICAL FACTORS:   Bipolar Disorder:   Depressive phase   Musculoskeletal: Strength & Muscle Tone: within normal limits Gait & Station: normal Patient leans: N/A  Psychiatric Specialty Exam: Review of Systems  Gastrointestinal: Negative for abdominal pain.  Musculoskeletal: Negative for back pain, joint pain and neck pain.  Neurological: Negative for dizziness and seizures.  Psychiatric/Behavioral: Positive for depression and suicidal ideas. Negative for hallucinations. The patient is not nervous/anxious.     Blood pressure 103/70, pulse 84, temperature 97.7 F (36.5 C), temperature source Oral, resp. rate 16, height 5' 10.5" (1.791 m), weight 124.739 kg (275 lb).Body mass index is 38.89 kg/(m^2).  General Appearance: Disheveled  Eye SolicitorContact::  Fair  Speech:  Clear and Coherent and Normal Rate  Volume:  Decreased  Mood:  Depressed  Affect:  Congruent  Thought Process:  Goal Directed  Orientation:  Full (Time, Place, and Person)  Thought Content:  Negative  Suicidal Thoughts:  Yes.  with intent/plan  Homicidal Thoughts:  No  Memory:  Immediate;   Good Recent;   Good Remote;   Good  Judgement:  Poor  Insight:  Present  Psychomotor Activity:  Decreased  Concentration:  Fair  Recall:  Fair  Fund of Knowledge:Fair  Language: Good  Akathisia:  No  Handed:  Right  AIMS (if indicated):     Assets:  Communication Skills Desire for Improvement  Sleep:  Number of Hours: 5.5  Cognition: WNL  ADL's:  Intact    COGNITIVE FEATURES THAT CONTRIBUTE TO RISK:  Closed-mindedness and Thought constriction (tunnel vision)    SUICIDE RISK:  Extreme:  Frequent, intense, and enduring suicidal ideation, specific plans, clear subjective and objective intent, impaired self-control, severe dysphoria/symptomatology, many risk factors and no protective factors.  PLAN OF CARE: Admit to inpt psych and restart Lamictal and Trazodone.   I certify that inpatient services  furnished can reasonably be expected to improve the patient's condition.   Oletta Darter, MD 05/10/2015, 9:13 AM

## 2015-05-10 NOTE — [facility] Group Notes (Signed)
[facility] Group Notes:  (Nursing/Healthy Coping Skills)  Date:  05/10/2015  Time: 1000  Type of Therapy:  Nurse Education  Participation Level:  Did Not Attend  Summary of Progress/Problems: Pt was admitted at approximately 0400 today. Pt was asleep at time of scheduled groups. Pt encouraged to attend groups and verbalized understanding.  Sherryl MangesWesseh, Marvyn Torrez 05/10/2015, 1000

## 2015-05-10 NOTE — Progress Notes (Signed)
D: Pt A & O X3. Visible in milieu / dayroom at long intervals during shift. Observed interacting well with peers and staff. Pt did not attend scheduled AM groups as he was asleep at the time, came in towards the early late part of the morning. Pt denies SI, HI, AVH and pain on initial contact. Rated his depression 0/10, hopelessness 0/10 and anxiety 0/10 with a good night sleep and good appetite on self inventory sheet. Pt's goal for today "me not bottling up my emotions and expressing how I feel", which he plans to achieve by "participating in groups". A: Scheduled medications administered as prescribed. Emotional support and availability provided to pt. Writer encouraged pt to attend scheduled groups and voice concerns / needs. Safety checks done at Q 15 minutes intervals without self harm gestures to report thus far this shift.  R: Pt receptive to care. Remains safe on and off unit. No physical distress evident at this time. POC maintained.

## 2015-05-11 DIAGNOSIS — T1491 Suicide attempt: Secondary | ICD-10-CM

## 2015-05-11 DIAGNOSIS — T424X2A Poisoning by benzodiazepines, intentional self-harm, initial encounter: Secondary | ICD-10-CM

## 2015-05-11 NOTE — Progress Notes (Signed)
D Pt denied depression, anxiety, pain, SI, HI or AVH; he states, "I feel good; I know I need to start doing right by my mom" Pt was however, observed quietly pacing the hall. Pt remained cooperative. No questions asked. A: Medications offered as prescribed.  Support, encouragement, and safe environment provided.  15-minute safety checks continue. R: Pt was med compliant.  Pt attended wrap-up group. Safety checks continue.

## 2015-05-11 NOTE — Progress Notes (Signed)
D: Patient is up in the milieu; he is pleasant upon approach.  Patient denies any depressive symptoms; denies SI/HI/AVH.  His goal is to "work on my depression", however, denies any.  He is minimal with staff.  He is restless and fidgety; seen pacing in the hallway.  Patient minimizes his substance abuse.   A: Continue to monitor medication management and MD orders.  Safety checks completed every 15 minutes per protocol.  Offer support and encouragement as needed. R: Patient is receptive to staff; his behavior is appropriate.

## 2015-05-11 NOTE — BHH Counselor (Signed)
Adult Comprehensive Assessment  Patient ID: Warren Mcbride, male   DOB: 08/10/1995, 20 y.o.   MRN: [id number]  Information Source: Information source: Patient  Current Stressors:  Educational / Learning stressors: Denies stressors Employment / Job issues: Denies stressors Family Relationships: Does not get along with mother like he used to.  She came to see him yesterday, and that made him happy.  They are working on their relationship. Financial / Lack of resources (include bankruptcy): Denies stressors Housing / Lack of housing: Denies stressors Physical health (include injuries & life threatening diseases): Denies stressors Social relationships: Denies stressors Substance abuse: Denies stressors Bereavement / Loss: Denies stressors  Living/Environment/Situation:  Living Arrangements: Spouse/significant other, Parent, Other relatives (Mother, girlfriend, brother) Living conditions (as described by patient or guardian): Good, safe How long has patient lived in current situation?: Girlfriend has been there 2 years.  Has always lived with mother. What is atmosphere in current home: Loving, Other (Comment) (Arguments at times)  Family History:  Marital status: Long term relationship  (the person with whom he was in a relationship met with assessment CSW in the hall and reported that they are still friends, but no longer dating) Long term relationship, how long?: 2 years What types of issues is patient dealing with in the relationship?: None Are you sexually active?: Yes What is your sexual orientation?: Straight Has your sexual activity been affected by drugs, alcohol, medication, or emotional stress?: None Does patient have children?: No  Childhood History:  By whom was/is the patient raised?: Both parents Additional childhood history information: Father lived in the home until pt was 40yo. Description of patient's relationship with caregiver when they were a child: Did not really  have a relationship with father.  Had a good relationship with mother. Patient's description of current relationship with people who raised him/her: Is working on relationship with both mother and father now.  Arguments sometimes, grew distant as he got older. How were you disciplined when you got in trouble as a child/adolescent?: "Whooped" Does patient have siblings?: Yes Number of Siblings: 2 Description of patient's current relationship with siblings: Has 2 brothers, has a great relationship with both. Did patient suffer any verbal/emotional/physical/sexual abuse as a child?: No Did patient suffer from severe childhood neglect?: No Has patient ever been sexually abused/assaulted/raped as an adolescent or adult?: No Was the patient ever a victim of a crime or a disaster?: No Witnessed domestic violence?: No Has patient been effected by domestic violence as an adult?: No  Education:  Highest grade of school patient has completed: Is in freshman year of college now Currently a student?: Yes If yes, how has current illness impacted academic performance: Has some late assignments due to depression. Name of school: [employer] person: Pt himself How long has the patient attended?: 1 year Learning disability?: Yes What learning problems does patient have?: ADHD  Employment/Work Situation:   Employment situation: Employed Where is patient currently employed?: [employer] long has patient been employed?: 5 months Patient's job has been impacted by current illness: No What is the longest time patient has a held a job?: 2 years Where was the patient employed at that time?: [employer] Has patient ever been in the [employer]?: No Are There Guns or Other Weapons in [place]?: No  Financial Resources:   Financial resources: Income from employment, Private insurance Does patient have a representative payee or guardian?: No  Alcohol/Substance Abuse:   What has been your use of  drugs/alcohol within  the last 12 months?: Marijuana but "not too often because I'm on probation" If attempted suicide, did drugs/alcohol play a role in this?: Yes (Used Xanax to try to end his life) Alcohol/Substance Abuse Treatment Hx: Substance abuse evaluation, Past Tx, Outpatient If yes, describe treatment: [facility] treatment  Has alcohol/substance abuse ever caused legal problems?: No  Social Support System:   Pensions consultant Support System: Manufacturing engineer System: mother, brothers, girlfriend, father Type of faith/religion: Christianity How does patient's faith help to cope with current illness?: Goes to church, "it helps me a lot."  Leisure/Recreation:   Leisure and Hobbies: Plays with dog  Strengths/Needs:   What things does the patient do well?: Good at a lot of things, reading, writing In what areas does patient struggle / problems for patient: Being able to express himself like he is "supposed to," has problems with bottling up emotions.  Off medicaitions since December 2016 when his Medicaid ran out.   Discharge Plan:   Does patient have access to transportation?: Yes Will patient be returning to same living situation after discharge?: Yes Currently receiving community mental health services: No If no, would patient like referral for services when discharged?: Yes (What county?) ([place] says he used to go to a place for [employer] and therapy at [facility] [place] [facility] and would like to return. Does patient have financial barriers related to discharge medications?: No  Summary/Recommendations:   Summary and Recommendations (to be completed by the evaluator): Patient is a 20yo male admitted to the hospital with a deliberate overdose with intent to commit suicide and reports primary trigger for admission was feeling unloved and being off psychiatric medications since November 2016 when he lost his Medicaid.  Patient will benefit from  crisis stabilization, medication evaluation, group therapy and psychoeducation, in addition to case management for discharge planning. At discharge it is recommended that Patient adhere to the established discharge plan and continue in treatment.  Lysle Dingwall. 05/11/2015

## 2015-05-11 NOTE — Progress Notes (Signed)
Pt attended AA meeting this evening.  

## 2015-05-11 NOTE — [facility] Group Notes (Signed)
The focus of this group is to educate the patient on the purpose and policies of crisis stabilization and provide a format to answer questions about their admission.  The group details unit policies and expectations of patients while admitted.  Patient did not attend 0900 nurse education orientation group this morning.  Patient stayed in bed.   

## 2015-05-11 NOTE — [facility] Group Notes (Signed)
[facility] Group Notes:  (Clinical Social Work)  05/11/2015  10:00-11:00AM  Summary of Progress/Problems:   The main focus of today's process group was to   1)  discuss the importance of adding supports  2)  define health supports versus unhealthy supports  3)  identify the patient's current unhealthy supports and plan how to handle them  4)  Identify the patient's current healthy supports and plan what to add.  An emphasis was placed on using counselor, doctor, therapy groups, 12-step groups, and problem-specific support groups to expand supports.    The patient expressed full comprehension of the concepts presented, and agreed that there is a need to add more supports.  The patient was fully engaged and insightful for the limited time he was in group, but he left after about the first 1/2 hour.  Type of Therapy:  Process Group with Motivational Interviewing  Participation Level:  Active  Participation Quality:  Attentive and Sharing  Affect:  Appropriate  Cognitive:  Appropriate  Insight:  Developing/Improving  Engagement in Therapy:  Engaged  Modes of Intervention:   Education, Support and Processing, Activity  Ambrose MantleMareida Grossman-Orr, LCSW 05/11/2015

## 2015-05-11 NOTE — Plan of Care (Signed)
Problem: Alteration in mood Goal: LTG-Pt's behavior demonstrates decreased signs of depression (Patient's behavior demonstrates decreased signs of depression to the point the patient is safe to return home and continue treatment in an outpatient setting)  Outcome: Progressing Patient reports decreased depressive symptoms.

## 2015-05-11 NOTE — Progress Notes (Signed)
[facility]  05/11/2015 9:54 AM Warren Mcbride  MRN:  [id number]   Subjective:   Patient reports " I feel fine now that I was restarted on my mediations. I am ready to leave."  Objective: Warren Mcbride is awake, alert and oriented X3, found sitting in the dayroom. Patient appears to be minimizing thoughts. Denies suicidal or homicidal ideation. Denies auditory or visual hallucination and does not appear to be responding to internal stimuli. Patient reports interacts well with staff and others. Patient reports he is ready to go back to work at [employer]. Patient reports he is medication compliant without mediation side effects. Report learning new coping skills to color and write. States his depression 0/10. Reports good appetite other wise and resting well. Support, encouragement and reassurance was provided.   Principal Problem: Bipolar I disorder, most recent episode depressed (HCC) Diagnosis:   Patient Active Problem List   Diagnosis Date Noted  . Depression [F32.9] 05/10/2015  . Bipolar I disorder, most recent episode depressed (HCC) [F31.30] 05/08/2015  . Polysubstance dependence including opioid type drug, episodic abuse (HCC) [F11.20, F19.20] 05/08/2015  . Overdose of benzodiazepine [T42.4X1A]   . Sinus pause [I45.5]   . Overdose [T50.901A] 05/07/2015  . Tachypnea [R06.82] 05/07/2015  . History of asthma [Z87.09] 05/07/2015  . Blurred vision [H53.8] 05/07/2015  . 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 Syncope [R55] 05/18/2013   Total Time spent with patient: 30 minutes  Past Psychiatric History: See Above  Past Medical History:  Past Medical History  Diagnosis Date  . Asthma   . Anxiety   . ADHD (attention deficit hyperactivity disorder)   . Depression   . 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 Johney FrameAllred - no PPM indicated at the time; AV block due to increased vagal tone     Past Surgical History  Procedure Laterality Date  . No past  surgeries     Family History: History reviewed. No pertinent family history. Family Psychiatric  History: Unknown Social History:  History  Alcohol Use No     History  Drug Use No    Social History   Social History  . Marital Status: Single    Spouse Name: N/A  . Number of Children: N/A  . Years of Education: N/A   Social History Main Topics  . Smoking status: Current Every Day Smoker -- 0.25 packs/day for 4 years    Types: Cigarettes  . Smokeless tobacco: Never Used  . Alcohol Use: No  . Drug Use: No  . Sexual Activity: Yes   Other Topics Concern  . None   Social History Narrative   [place]  [place].  Recently employed at [employer].   Additional Social History:                         Sleep: Good  Appetite:  Fair  Current Medications: Current Facility-Administered Medications  Medication Dose Route Frequency Provider Last Rate Last Dose  . acetaminophen (TYLENOL) tablet 650 mg  650 mg Oral Q6H PRN Rachael FeeIrving A Lugo, MD      . alum & mag hydroxide-simeth (MAALOX/MYLANTA) 200-200-20 MG/5ML suspension 30 mL  30 mL Oral Q4H PRN Rachael FeeIrving A Lugo, MD      . lamoTRIgine (LAMICTAL) tablet 25 mg  25 mg Oral Daily Oneta Rackanika N Vlad Mayberry, NP   25 mg at 05/11/15 0924  . magnesium hydroxide (  MILK OF MAGNESIA) suspension 30 mL  30 mL Oral Daily PRN Rachael Fee, MD      . traZODone (DESYREL) tablet 50 mg  50 mg Oral QHS PRN,MR X 1 Oneta Rack, NP   50 mg at 05/10/15 2151    Lab Results: No results found for this or any previous visit (from the past 48 hour(s)).  Blood Alcohol level:  Lab Results  Component Value Date   ETH <5 05/06/2015    Physical Findings: AIMS:  , ,  ,  ,    CIWA:    COWS:     Musculoskeletal: Strength & Muscle Tone: within normal limits Gait & Station: normal Patient leans: N/A  Psychiatric Specialty Exam: Review of Systems  Psychiatric/Behavioral: Negative for depression, suicidal ideas and hallucinations. The patient has  insomnia.   All other systems reviewed and are negative.   Blood pressure 106/72, pulse 103, temperature 98.2 F (36.8 C), temperature source Oral, resp. rate 18, height 5' 10.5" (1.791 m), weight 124.739 kg (275 lb).Body mass index is 38.89 kg/(m^2).  General Appearance: Casual  Eye Contact::  Good  Speech:  Clear and Coherent  Volume:  Normal  Mood:  Anxious  Affect:  Congruent  Thought Process:  Intact and Logical  Orientation:  Full (Time, Place, and Person)  Thought Content:  Hallucinations: None  Suicidal Thoughts:  No  Homicidal Thoughts:  No  Memory:  Immediate;   Fair Recent;   Fair Remote;   Fair  Judgement:  Intact  Insight:  Fair  Psychomotor Activity:  Normal  Concentration:  Fair  Recall:  [employer] of Knowledge:Fair  Language: Good  Akathisia:  No  Handed:  Right  AIMS (if indicated):     Assets:  Communication Skills Desire for Improvement Resilience  ADL's:  Intact  Cognition: WNL  Sleep:  Number of Hours: 6     I agree with current treatment plan on 05/11/2015, Patient seen face-to-face for psychiatric evaluation follow-up, chart reviewed. Reviewed the information documented and agree with the treatment plan.  Treatment Plan Summary: Daily contact with patient to assess and evaluate symptoms and progress in treatment and Medication management  Daily contact with patient to assess and evaluate symptoms and progress in treatment and Medication management Continue Lamictal 25 mg for mood stabilization. With titration Continue with Trazodone 50 mg PO X 1 repeat dose for insomnia Will continue to monitor vitals ,medication compliance and treatment side effects while patient is here.  Reviewed labs Glucose 100 elevated ,BAL - 0, UDS+ positive for opiates, amphetamines, THC and benzodiazepines. CSW will start working on disposition.  Patient to participate in therapeutic milieu   Oneta Rack, NP 05/11/2015, 9:54 AM

## 2015-05-12 MED ORDER — TRAZODONE HCL 50 MG PO TABS: 50.0000 mg | ORAL_TABLET | Freq: Every evening | ORAL | Status: DC | PRN

## 2015-05-12 MED ORDER — LAMOTRIGINE 25 MG PO TABS: 25.0000 mg | ORAL_TABLET | Freq: Every day | ORAL | Status: DC

## 2015-05-12 NOTE — Tx Team (Signed)
Interdisciplinary Treatment Plan Update (Adult)  Date:  05/12/2015  Time Reviewed:  10:28 AM   Progress in Treatment: Attending groups: Yes. Participating in groups:  Yes. Taking medication as prescribed:  Yes. Tolerating medication:  Yes. Family/Significant othe contact made:  SPE required for this pt. CSW attempting to make contact with pt's mother who is at work currently.  Patient understands diagnosis:  Yes. and As evidenced by:  seeking treatment for SI, depression, substance abuse, and medication management Discussing patient identified problems/goals with staff:  Yes. Medical problems stabilized or resolved:  Yes. Denies suicidal/homicidal ideation: Yes. Issues/concerns per patient self-inventory:  Other:  Discharge Plan or Barriers: Pt plans to d/c home and follow-up at [facility] immediately.   Reason for Continuation of Hospitalization: none  Comments:  increased symptoms of depression and status post suicidal attempt by taking intentional overdose of Xanax 2 mg5. Patient stated that he feels nobody love him, took medication from street with intention to go to sleep and not wake up. Reportedly patient mother was not at home during this episode, he reports being dictated for the incident. He also reportedly unable to access his outpatient psychiatrist and therapist secondary to no health insurance since November 2016. Patient reportedly working in [employer], want to pay his charges to the probation officer otherwise he might end up going to jail. Patient also reportedly have a mood swings, irritability, agitation and initially stated he does not want to go to inpatient psychiatric hospitalization because he worried about going to the jail without paying his probation officer for his court charges. Patient has no previous acute psychiatric hospitalization or suicide attempts. Patient is in agreement, for inpatient psychiatric hospitalization but does not want to sign  voluntarily until he discussed with his mother who will be coming into the hospital this afternoon. Patient endorses smoking marijuana but denied using opiates, amphetamines which were positive in urine drug screen. Patient also stated his mother given cough syrup. He smoked marijuana about 2 weeks ago. [facility] and was diagnosed with ADHD and bipolar disorder in the past. Patient is currently under cardiac monitoring for second degree AV block. Patient urine drug screen was positive for amphetamines, benzos, opiates, and marijuana.  Estimated length of stay:  D/c today  Additional Comments:  Patient and CSW reviewed pt's identified goals and treatment plan. Patient verbalized understanding and agreed to treatment plan. CSW reviewed [facility] "Discharge Process and Patient Involvement" Form. Pt verbalized understanding of information provided and signed form.    Review of initial/current patient goals per problem list:  1. Goal(s): Patient will participate in aftercare plan  Met: Yes  Target date: at discharge  As evidenced by: Patient will participate within aftercare plan AEB aftercare provider and housing plan at discharge being identified.  5/1: Pt plans to return home; follow-up with [facility].   2. Goal (s): Patient will exhibit decreased depressive symptoms and suicidal ideations.  Met: Yes   Target date: at discharge  As evidenced by: Patient will utilize self rating of depression at 3 or below and demonstrate decreased signs of depression or be deemed stable for discharge by MD.  5/1: Pt reports depression as 0/10 and presents with pleasant mood/calm affect. Denies SI/HI/AVH.   4. Goal(s): Patient will demonstrate decreased signs of withdrawal due to substance abuse  Met:Yes  Target date:at discharge   As evidenced by: Patient will produce a CIWA/COWS score of  0, have stable vitals signs, and  no symptoms of withdrawal.  5/1: Pt denies withdrawals with no COWS/CIWA and stable vitals.   Attendees: Patient:   05/12/2015 10:28 AM   Family:   05/12/2015 10:28 AM   Physician:  Dr. Carlton Adam, MD 05/12/2015 10:28 AM   Nursing:   Everlean Cherry RN 05/12/2015 10:28 AM   Clinical Social Worker: Maxie Better, LCSW 05/12/2015 10:28 AM   Clinical Social Worker: Erasmo Downer Drinkard LCSW 05/12/2015 10:28 AM   Other:  Gerline Legacy Nurse Case Manager 05/12/2015 10:28 AM   Other:   05/12/2015 10:28 AM   Other:   05/12/2015 10:28 AM   Other:  05/12/2015 10:28 AM   Other:  05/12/2015 10:28 AM   Other:  05/12/2015 10:28 AM    05/12/2015 10:28 AM    05/12/2015 10:28 AM    05/12/2015 10:28 AM    05/12/2015 10:28 AM    Scribe for Treatment Team:   Maxie Better, LCSW 05/12/2015 10:28 AM

## 2015-05-12 NOTE — Plan of Care (Signed)
Problem: Diagnosis: Increased Risk For Suicide Attempt Goal: LTG-Patient Will Report Improved Mood and Deny Suicidal LTG (by discharge) Patient will report improved mood and deny suicidal ideation.  Outcome: Progressing Denies SI, says he's great. Possibility that this is superficial interaction.

## 2015-05-12 NOTE — Progress Notes (Addendum)
  [facility] Discharge Plan :  Will you be returning to the same living situation after discharge:  Yes,  home At discharge, do you have transportation home?: Yes,  pt's mother-he will be leaving this evening. Pt's mother will pick him up when she gets off from work.  Do you have the ability to pay for your medications: Yes,  mental health  Release of information consent forms completed and submitted to medical records by CSW.  Patient to Follow up at: Follow-up Information    Follow up with [facility] On 05/12/2015.   Why:  Go directly to [facility] at discharge to be seen for hospital follow-up.    Contact information:    [address] [place], [place] [address] Phone: [phone]      Next level of care provider has access to [facility] Link:no  Safety Planning and Suicide Prevention discussed: Yes,  Contact attempts made with pt's mother. SPI pamphlet and mobile crisis information provided to pt and he was encouraged to share information with support network.   Have you used any form of tobacco in the last 30 days? (Cigarettes, Smokeless Tobacco, Cigars, and/or Pipes): Yes  Has patient been referred to the Quitline?: yes, faxed on 05/12/15 at 12:40PM   Patient has been referred for addiction treatment: Yes  Smart, Robby Bulkley LCSW 05/12/2015, 12:43 PM

## 2015-05-12 NOTE — Progress Notes (Signed)
Patient ID: Warren Mcbride, Warren Mcbride   DOB: 1995-08-11, 20 y.o.   MRN: [id number]  Pt. Denies SI/HI and A/V hallucinations. Belongings returned to patient at time of discharge. Patient denies any pain or discomfort. Discharge instructions and medications were reviewed with patient. Patient verbalized understanding of both medications and discharge instructions. Patient was discharged to lobby by Candise Bowensatreka RN. Q15 minute safety checks maintained until discharge. No distress upon discharge.

## 2015-05-12 NOTE — [facility] Suicide Risk Assessment (Signed)
[facility] INPATIENT:  Family/Significant Other Suicide Prevention Education  Suicide Prevention Education:  Contact Attempts: Lauris ChromanCora Schwer (pt's mother) [phone] has been identified by the patient as the family member/significant other with whom the patient will be residing, and identified as the person(s) who will aid the patient in the event of a mental health crisis.  With written consent from the patient, two attempts were made to provide suicide prevention education, prior to and/or following the patient's discharge.  We were unsuccessful in providing suicide prevention education.  A suicide education pamphlet was given to the patient to share with family/significant other.  SPE completed with pt. SPI pamphlet provided to pt and pt was encouraged to share information with support network, ask questions, and talk about any concerns relating to SPE. Pt denies access to guns/firearms and verbalized understanding of information provided. Mobile Crisis information also provided to pt.   Date and time of first attempt: 05/12/15 at 10:05AM (unable to leave voicemail/unsecure line).  Date and time of second attempt: 51/1/17 at 12:40PM (unable to leave voicemail/unsecure line).   Smart, Sebastyan Snodgrass LCSW 05/12/2015, 12:41 PM

## 2015-05-12 NOTE — Discharge Summary (Signed)
Physician Discharge Summary Note  Patient:  Warren Mcbride is an 20 y.o., male MRN:  [id number] DOB:  04-12-1995 Patient phone:  [phone] (home)  Patient address:   [address] [place] [address],  Total Time spent with patient: Greater than 30 minutes  Date of Admission:  05/10/2015  Date of Discharge: 05-12-15  Reason for Admission:    Principal Problem: Bipolar I disorder, most recent episode depressed [facility])  Discharge Diagnoses: Patient Active Problem List   Diagnosis Date Noted  . Depression [F32.9] 05/10/2015  . Bipolar I disorder, most recent episode depressed (HCC) [F31.30] 05/08/2015  . Polysubstance dependence including opioid type drug, episodic abuse (HCC) [F11.20, F19.20] 05/08/2015  . Overdose of benzodiazepine [T42.4X1A]   . Sinus pause [I45.5]   . Overdose [T50.901A] 05/07/2015  . Tachypnea [R06.82] 05/07/2015  . History of asthma [Z87.09] 05/07/2015  . Blurred vision [H53.8] 05/07/2015  . 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 Syncope [R55] 05/18/2013   Past Psychiatric History: Bipolar disorder  Past Medical History:  Past Medical History  Diagnosis Date  . Asthma   . Anxiety   . ADHD (attention deficit hyperactivity disorder)   . Depression   . 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 Johney Frame - no PPM indicated at the time; AV block due to increased vagal tone     Past Surgical History  Procedure Laterality Date  . No past surgeries     Family History: History reviewed. No pertinent family history.  Family Psychiatric  History: See H&P  Social History:  History  Alcohol Use No     History  Drug Use No    Social History   Social History  . Marital Status: Single    Spouse Name: N/A  . Number of Children: N/A  . Years of Education: N/A   Social History Main Topics  . Smoking status: Current Every Day Smoker -- 0.25 packs/day for 4 years    Types: Cigarettes  . Smokeless tobacco: Never Used  .  Alcohol Use: No  . Drug Use: No  . Sexual Activity: Yes   Other Topics Concern  . None   Social History Narrative   Lives in [place] with  Mother.  Recently employed at [employer].   Hospital Course: Warren Mcbride is a 20 y.o. male, seen, chart reviewed and case discussed with psych LCSW for the face-to-face psychiatric consultation and evaluation of increased symptoms of depression and status post suicidal attempt by taking intentional overdose of Xanax 2 mg  5. Patient stated that he feels nobody love him, took medication from street with intention to go to sleep and not wake up. Reportedly patient mother was not at home during this episode, he reports being dictated for the incident. He also reportedly unable to access his outpatient psychiatrist and therapist secondary to no health insurance since November 2016. Patient reportedly working in [employer], want to pay his charges to the probation officer otherwise he might end up going to jail. Patient also reportedly has mood swings, irritability, agitation and initially stated he does not want to go to inpatient psychiatric hospitalization because he is worried about going to the jail without paying his probation officer for his court charges. Patient has no previous acute psychiatric hospitalization or suicide attempts.   Upon his arrival & admision to the adult unit, Warren Mcbride was evaluated & his presenting symptoms identified. His UDS test reports were positive for Amphetamine,  Benzodiazepine, Opioid & THC. However, he was not presenting with any substance withdrawal symptoms. As a result did not receive any detoxification treatments. He was however started on Trazodone 50 mg for insomnia & Lamictal 25 mg for mood stabilization. He was enrolled & encouraged to participate in the unit programming, he did & learned coping skills. He presented no other significant health issues that required treatment or monitoring. During the course of his  treatment, Warren Mcbride was evaluated on daily basis by the clinical providers to assure his response to his treatment regimen.As his treatment progressed, improvement was noted as evidenced by his report of decreasing symptoms, improved mood, affect, medication tolerance & active participation in the unit programming. He was encouraged to update his providers on his progress by daily completion of a self inventory assessment, noting mood, mental status, pain, any new symptoms, anxiety and or concerns.  Warren Mcbride's symptoms responded well to his treatment regimen, combined with a therapeutic and supportive environment. He was motivated for recovery as evidenced by a positive/appropriate behavior & his interaction with the staff & fellow patients.He also worked closely with the treatment team and case manager to develop a discharge plan with appropriate goals to maintain mood stability after discharge. Coping skills, problem solving as well as relaxation therapies were also part of the unit programming.  Upon his hospital discharge, Warren Mcbride was in much improved condition than upon admission.His symptoms were reported as significantly decreased or resolved completely. He adamantly denies any SI/HI,  AVH, delusional thoughts & or paranoia. He was motivated to continue taking medication with a goal of continued improvement in mental health. He will continue psychiatric care on an outpatient basis as noted below. He is provided with all the necessary information required to make this appointment without problems. He received a 7 days worth, supply samples of his [facility] discharge medications. He left [facility] with all personal belongings in no apparent distress. Transportation per mother.  Physical Findings: AIMS:  , ,  ,  ,    CIWA:    COWS:     Musculoskeletal: Strength & Muscle Tone: within normal limits Gait & Station: normal Patient leans: N/A  Psychiatric Specialty Exam: Review of Systems  Constitutional:  Negative.   HENT: Negative.   Eyes: Negative.   Respiratory: Negative.   Cardiovascular: Negative.   Gastrointestinal: Negative.   Genitourinary: Negative.   Musculoskeletal: Negative.   Skin: Negative.   Neurological: Negative.   Endo/Heme/Allergies: Negative.   Psychiatric/Behavioral: Positive for depression (Stable) and substance abuse (Hx. Polysusbtsance dependence). Negative for suicidal ideas, hallucinations and memory loss. The patient has insomnia (Stable). The patient is not nervous/anxious.     Blood pressure 107/75, pulse 108, temperature 98.2 F (36.8 C), temperature source Oral, resp. rate 17, height 5' 10.5" (1.791 m), weight 124.739 kg (275 lb).Body mass index is 38.89 kg/(m^2).  See Md's SRA   Have you used any form of tobacco in the last 30 days? (Cigarettes, Smokeless Tobacco, Cigars, and/or Pipes): Yes  Has this patient used any form of tobacco in the last 30 days? (Cigarettes, Smokeless Tobacco, Cigars, and/or Pipes): No  Blood Alcohol level:  Lab Results  Component Value Date   ETH <5 05/06/2015   Metabolic Disorder Labs:  No results found for: HGBA1C, MPG No results found for: PROLACTIN No results found for: CHOL, TRIG, HDL, CHOLHDL, VLDL, LDLCALC  See Psychiatric Specialty Exam and Suicide Risk Assessment completed by Attending Physician prior to discharge.  Discharge destination:  Home  Is patient on  multiple antipsychotic therapies at discharge:  No   Has Patient had three or more failed trials of antipsychotic monotherapy by history:  No  Recommended Plan for Multiple Antipsychotic Therapies: NA    Medication List    STOP taking these medications        ibuprofen 800 MG tablet  Commonly known as:  ADVIL,MOTRIN     ipratropium-albuterol 0.5-2.5 (3) MG/3ML Soln  Commonly known as:  DUONEB      TAKE these medications      Indication   lamoTRIgine 25 MG tablet  Commonly known as:  LAMICTAL  Take 1 tablet (25 mg total) by mouth daily. For  mood stabilization   Indication:  Mood stabilization     traZODone 50 MG tablet  Commonly known as:  DESYREL  Take 1 tablet (50 mg total) by mouth at bedtime as needed and may repeat dose one time if needed for sleep.   Indication:  Trouble Sleeping           Follow-up Information    Follow up with [facility] On 05/12/2015.   Why:  Go directly to [facility] at discharge to be seen for hospital follow-up.    Contact information:    [address] [place], [place] [address] Phone: [phone]     Follow-up recommendations: Activity:  As tolerated Diet: As recommended by your primary care doctor. Keep all scheduled follow-up appointments as recommended.  Comments: Take all your medications as prescribed by your mental healthcare provider. Report any adverse effects and or reactions from your medicines to your outpatient provider promptly. Patient is instructed and cautioned to not engage in alcohol and or illegal drug use while on prescription medicines. In the event of worsening symptoms, patient is instructed to call the crisis hotline, 911 and or go to the nearest ED for appropriate evaluation and treatment of symptoms. Follow-up with your primary care provider for your other medical issues, concerns and or health care needs.   Signed: Sanjuana KavaNwoko, Agnes I, NP, PMHNP, FNP-BC 05/12/2015, 1:48 PM  I personally assessed the patient and formulated the plan Madie RenoIrving A. Dub MikesLugo, M.D.

## 2015-05-12 NOTE — [facility] Suicide Risk Assessment (Signed)
[facility]   Principal Problem: Bipolar I disorder, most recent episode depressed [facility](HCC) Discharge Diagnoses:  Patient Active Problem List   Diagnosis Date Noted  . Depression [F32.9] 05/10/2015  . Bipolar I disorder, most recent episode depressed (HCC) [F31.30] 05/08/2015  . Polysubstance dependence including opioid type drug, episodic abuse (HCC) [F11.20, F19.20] 05/08/2015  . Overdose of benzodiazepine [T42.4X1A]   . Sinus pause [I45.5]   . Overdose [T50.901A] 05/07/2015  . Tachypnea [R06.82] 05/07/2015  . History of asthma [Z87.09] 05/07/2015  . Blurred vision [H53.8] 05/07/2015  . 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 Syncope [R55] 05/18/2013    Total Time spent with patient: 20 minutes  Musculoskeletal: Strength & Muscle Tone: within normal limits Gait & Station: normal Patient leans: normal  Psychiatric Specialty Exam: Review of Systems  Constitutional: Negative.   HENT: Negative.   Eyes: Negative.   Respiratory: Negative.   Cardiovascular: Negative.   Gastrointestinal: Negative.   Genitourinary: Negative.   Musculoskeletal: Negative.   Skin: Negative.   Neurological: Negative.   Endo/Heme/Allergies: Negative.   Psychiatric/Behavioral: Positive for depression and substance abuse.    Blood pressure 107/75, pulse 108, temperature 98.2 F (36.8 C), temperature source Oral, resp. rate 17, height 5' 10.5" (1.791 m), weight 124.739 kg (275 lb).Body mass index is 38.89 kg/(m^2).  General Appearance: Fairly Groomed  Patent attorneyye Contact::  Fair  Speech:  Clear and Coherent409  Volume:  Normal  Mood:  Euthymic  Affect:  Appropriate  Thought Process:  Coherent and Goal Directed  Orientation:  Full (Time, Place, and Person)  Thought Content:  symptoms events worries concerns  Suicidal Thoughts:  No  Homicidal Thoughts:  No  Memory:  Immediate;   Fair Recent;   Fair Remote;   Fair  Judgement:  Fair  Insight:  Present and Shallow   Psychomotor Activity:  Restlessness  Concentration:  Fair  Recall:  FiservFair  Fund of Knowledge:Fair  Language: Fair  Akathisia:  No  Handed:  Right  AIMS (if indicated):     Assets:  Desire for Improvement Housing Social Support  Sleep:  Number of Hours: 6.75  Cognition: WNL  ADL's:  Intact  In full contact with reality. There are no active S/S of withdrawal. There are no active SI plans or intent. He is willing and motivated to stay on his medications and follow up outpatient basis Mental Status Per Nursing Assessment::   On Admission:     Demographic Factors:  Male and Adolescent or young adult  Loss Factors: none identified  Historical Factors: none identified  Risk Reduction Factors:   Living with another person, especially a relative and Positive social support  Continued Clinical Symptoms:  Bipolar Disorder:   Depressive phase  Cognitive Features That Contribute To Risk:  None    Suicide Risk:  Minimal: No identifiable suicidal ideation.  Patients presenting with no risk factors but with morbid ruminations; may be classified as minimal risk based on the severity of the depressive symptoms  Follow-up Information    Follow up with [facility] On 05/12/2015.   Why:  Go directly to [facility] at discharge to be seen for hospital follow-up.    Contact information:    [address] [place], [place] [address] Phone: [phone]      Plan Of Care/Follow-up recommendations:  Activity:  as tolerated Diet:  regular Follow up as above Caitlyn Buchanan A, MD 05/12/2015, 1:09 PM

## 2015-05-12 NOTE — [facility] Group Notes (Signed)
[facility] LCSW Group Therapy Topic:  Overcoming Obstacles  05/12/2015 3:26 PM  Type of Therapy:  Group Therapy  Participation Level:  Did Not Attend   Warren Mcbride C 05/12/2015, 3:26 PM  

## 2015-05-12 NOTE — Progress Notes (Signed)
Pt attended spiritual care group on grief and loss facilitated by chaplain Somya Jauregui   Group opened with brief discussion and psycho-social ed around grief and loss in relationships and in relation to self - identifying life patterns, circumstances, changes that cause losses. Established group norm of speaking from own life experience. Group goal of establishing open and affirming space for members to share loss and experience with grief, normalize grief experience and provide psycho social education and grief support.     

## 2015-05-12 NOTE — Progress Notes (Signed)
D: Pt denied depression, anxiety, pain, SI, HI or AVH; he states, "Just spoke to my mom; am good" Pt was a little restless, observed pacing the hall. Pt was also observed having appropriate conversation with peers. Pt remained cooperative. No questions asked. A: Medications offered as prescribed.  Support, encouragement, and safe environment provided.  15-minute safety checks continue. R: Pt was med compliant.  Pt attended AA group meeting. Safety checks continue.

## 2015-05-18 ENCOUNTER — Encounter (HOSPITAL_COMMUNITY): Payer: Self-pay

## 2015-05-18 ENCOUNTER — Emergency Department (HOSPITAL_COMMUNITY)
Admission: EM | Admit: 2015-05-18 | Discharge: 2015-05-18 | Disposition: A | Discharge status: Home / Self Care | Payer: 59 | Attending: Emergency Medicine | Admitting: Emergency Medicine

## 2015-05-18 ENCOUNTER — Emergency Department (HOSPITAL_COMMUNITY): Payer: 59

## 2015-05-18 DIAGNOSIS — F17210 Nicotine dependence, cigarettes, uncomplicated: Secondary | ICD-10-CM | POA: Diagnosis not present

## 2015-05-18 DIAGNOSIS — R079 Chest pain, unspecified: Secondary | ICD-10-CM | POA: Diagnosis present

## 2015-05-18 DIAGNOSIS — F419 Anxiety disorder, unspecified: Secondary | ICD-10-CM | POA: Diagnosis not present

## 2015-05-18 DIAGNOSIS — J45909 Unspecified asthma, uncomplicated: Secondary | ICD-10-CM | POA: Diagnosis not present

## 2015-05-18 DIAGNOSIS — Z79899 Other long term (current) drug therapy: Secondary | ICD-10-CM | POA: Diagnosis not present

## 2015-05-18 DIAGNOSIS — R55 Syncope and collapse: Secondary | ICD-10-CM | POA: Diagnosis not present

## 2015-05-18 DIAGNOSIS — F329 Major depressive disorder, single episode, unspecified: Secondary | ICD-10-CM | POA: Insufficient documentation

## 2015-05-18 LAB — I-STAT TROPONIN, ED
TROPONIN I, POC: 0 ng/mL (ref 0.00–0.08)
Troponin i, poc: 0 ng/mL (ref 0.00–0.08)

## 2015-05-18 LAB — BASIC METABOLIC PANEL
ANION GAP: 12 (ref 5–15)
BUN: 9 mg/dL (ref 6–20)
CALCIUM: 9.4 mg/dL (ref 8.9–10.3)
CO2: 23 mmol/L (ref 22–32)
Chloride: 104 mmol/L (ref 101–111)
Creatinine, Ser: 1.12 mg/dL (ref 0.61–1.24)
GFR calc Af Amer: >60 mL/min (ref >60)
GLUCOSE: 91 mg/dL (ref 65–99)
POTASSIUM: 4.3 mmol/L (ref 3.5–5.1)
SODIUM: 139 mmol/L (ref 135–145)

## 2015-05-18 LAB — CBC
HCT: 43.3 % (ref 39.0–52.0)
Hemoglobin: 14.1 g/dL (ref 13.0–17.0)
MCH: 27.7 pg (ref 26.0–34.0)
MCHC: 32.6 g/dL (ref 30.0–36.0)
MCV: 85.1 fL (ref 78.0–100.0)
Platelets: 158 10*3/uL (ref 150–400)
RBC: 5.09 MIL/uL (ref 4.22–5.81)
RDW: 13.7 % (ref 11.5–15.5)
WBC: 4.5 10*3/uL (ref 4.0–10.5)

## 2015-05-18 NOTE — ED Notes (Signed)
PER EMS: pt was walking down the road for about one hour when suddenly he felt very diaphoretic, weak and light-headed and complained of CP to EMS. No syncopal epiosode. He states he was told he has a hx of first degree Heart block and is supposed to see cardiology tomorrow. EMS adm 324 asa and one nitro tablet. BP-110/72, HR-80. A&Ox4, ambulatory.

## 2015-05-18 NOTE — Discharge Instructions (Signed)
There does not appear to be an emergent cause for your symptoms at this time. Your exam, labs, EKG chest x-ray are all reassuring. Follow-up with cardiology tomorrow for your regularly scheduled appointment. Return to ED for any new or worsening symptoms as we discussed.  Nonspecific Chest Pain It is often hard to find the cause of chest pain. There is always a chance that your pain could be related to something serious, such as a heart attack or a blood clot in your lungs. Chest pain can also be caused by conditions that are not life-threatening. If you have chest pain, it is very important to follow up with your doctor.  HOME CARE  If you were prescribed an antibiotic medicine, finish it all even if you start to feel better.  Avoid any activities that cause chest pain.  Do not use any tobacco products, including cigarettes, chewing tobacco, or electronic cigarettes. If you need help quitting, ask your doctor.  Do not drink alcohol.  Take medicines only as told by your doctor.  Keep all follow-up visits as told by your doctor. This is important. This includes any further testing if your chest pain does not go away.  Your doctor may tell you to keep your head raised (elevated) while you sleep.  Make lifestyle changes as told by your doctor. These may include:  Getting regular exercise. Ask your doctor to suggest some activities that are safe for you.  Eating a heart-healthy diet. Your doctor or a diet specialist (dietitian) can help you to learn healthy eating options.  Maintaining a healthy weight.  Managing diabetes, if necessary.  Reducing stress. GET HELP IF:  Your chest pain does not go away, even after treatment.  You have a rash with blisters on your chest.  You have a fever. GET HELP RIGHT AWAY IF:  Your chest pain is worse.  You have an increasing cough, or you cough up blood.  You have severe belly (abdominal) pain.  You feel extremely weak.  You pass out  (faint).  You have chills.  You have sudden, unexplained chest discomfort.  You have sudden, unexplained discomfort in your arms, back, neck, or jaw.  You have shortness of breath at any time.  You suddenly start to sweat, or your skin gets clammy.  You feel nauseous.  You vomit.  You suddenly feel light-headed or dizzy.  Your heart begins to beat quickly, or it feels like it is skipping beats. These symptoms may be an emergency. Do not wait to see if the symptoms will go away. Get medical help right away. Call your local emergency services (911 in the [place] Do not drive yourself to the hospital.   This information is not intended to replace advice given to you by your health care provider. Make sure you discuss any questions you have with your health care provider.   Document Released: 06/16/2007 Document Revised: 01/18/2014 Document Reviewed: 08/03/2013 Elsevier Interactive Patient Education [employer] Elsevier Inc.

## 2015-05-18 NOTE — ED Notes (Signed)
Pt stable, ambulatory, states understanding of discharge instructions 

## 2015-05-18 NOTE — ED Provider Notes (Signed)
CSN: [id number]     Arrival date & time 05/18/15  1318 History   First MD Initiated Contact with Patient 05/18/15 1319     Chief Complaint  Patient presents with  . Chest Pain  . Near Syncope     (Consider location/radiation/quality/duration/timing/severity/associated sxs/prior Treatment) HPI Warren Mcbride is a 20 y.o. male with a history of asthma, anxiety, suicide attempt, here for evaluation of chest pain. Patient reports at approximately 11:00 this morning, while walking to work he experienced sudden onset left-sided chest pressure with associated lightheadedness and diaphoresis. He denies any other numbness or weakness, nausea or vomiting, shortness of breath. He reports sensation lasted for approximately 1 hour until he was able to sit down and then the discomfort improved. He reports receiving 324 mg aspirin and 1 sublingual nitroglycerin from EMS and he reports this was somewhat helpful but caused him to have a headache. He reports his discomfort is now a 4/10. Reports he smokes 3 cigarettes per day. Denies any other illicit drug use. Has been taking his trazodone and rectal as prescribed.  Past Medical History  Diagnosis Date  . Asthma   . Anxiety   . ADHD (attention deficit hyperactivity disorder)   . Depression   . 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 Johney Frame - no PPM indicated at the time; AV block due to increased vagal tone    Past Surgical History  Procedure Laterality Date  . No past surgeries     No family history on file. Social History  Substance Use Topics  . Smoking status: Current Every Day Smoker -- 0.25 packs/day for 4 years    Types: Cigarettes  . Smokeless tobacco: Never Used  . Alcohol Use: No    Review of Systems A 10 point review of systems was completed and was negative except for pertinent positives and negatives as mentioned in the history of present illness     Allergies  Review of patient's allergies indicates no known  allergies.  Home Medications   Prior to Admission medications   Medication Sig Start Date End Date Taking? Authorizing Provider  lamoTRIgine (LAMICTAL) 25 MG tablet Take 1 tablet (25 mg total) by mouth daily. For mood stabilization 05/12/15  Yes Sanjuana Kava, NP  traZODone (DESYREL) 50 MG tablet Take 1 tablet (50 mg total) by mouth at bedtime as needed and may repeat dose one time if needed for sleep. 05/12/15  Yes Sanjuana Kava, NP   BP 110/56 mmHg  Pulse 69  Resp 20  SpO2 98% Physical Exam  Constitutional: He is oriented to person, place, and time. He appears well-developed and well-nourished.  HENT:  Head: Normocephalic and atraumatic.  Mouth/Throat: Oropharynx is clear and moist.  Eyes: Conjunctivae are normal. Pupils are equal, round, and reactive to light. Right eye exhibits no discharge. Left eye exhibits no discharge. No scleral icterus.  Neck: Neck supple.  Cardiovascular: Normal rate and normal heart sounds.   Pulmonary/Chest: Effort normal and breath sounds normal. No respiratory distress. He has no wheezes. He has no rales.  Abdominal: Soft. There is no tenderness.  Musculoskeletal: He exhibits no edema or tenderness.  Neurological: He is alert and oriented to person, place, and time.  Cranial Nerves II-XII grossly intact  Skin: Skin is warm and dry. No rash noted.  Psychiatric: He has a normal mood and affect.  Nursing note and vitals reviewed.   ED Course  Procedures (including critical care time) Labs  Review Labs Reviewed  BASIC METABOLIC PANEL  CBC  I-STAT TROPOININ, ED  Rosezena SensorI-STAT TROPOININ, ED    Imaging Review Dg Chest 2 View  05/18/2015  CLINICAL DATA:  Acute onset of weakness, lightheadedness and diaphoresis. Chest pain. EXAM: CHEST  2 VIEW COMPARISON:  05/07/2015 FINDINGS: The cardiac silhouette, mediastinal and hilar contours are normal and stable. The lungs are clear. No pleural effusion. The bony thorax is intact. IMPRESSION: Normal chest x-ray.  Electronically Signed   By: Rudie MeyerP.  Gallerani M.D.   On: 05/18/2015 14:55   I have personally reviewed and evaluated these images and lab results as part of my medical decision-making.   EKG Interpretation   Date/Time:  Sunday May 18 2015 13:24:56 EDT Ventricular Rate:  57 PR Interval:  215 QRS Duration: 94 QT Interval:  403 QTC Calculation: 392 R Axis:   74 Text Interpretation:  Unknown rhythm, irregular rate Prolonged PR interval  Confirmed by Ranae PalmsYELVERTON  MD, DAVID ([id number]) on 05/18/2015 1:30:02 PM      MDM  Warren Mcbride is a 20 y.o. male here for evaluation of chest pain. Patient denies any discomfort now in the emergency department. Heart score 2, PERC negative. Delta troponin negative. Screening labs are negative. Chest x-ray normal. EKG reassuring. Doubt ACS, PE or other emergent cardio pulmonary pathology at this time. Patient does have history of asthma, symptoms possibly due to bronchospasm that resolved spontaneously. Patient has follow-up appointment with cardiology tomorrow, stable for discharge. Discussed ED course for results and discharge instructions with patient, he verbalizes understanding. He also understands return to emergency Department for any new or worsening symptoms. Final diagnoses:  Chest pain, unspecified chest pain type        Joycie PeekBenjamin Abdallah Hern, PA-C 05/18/15 1819  Loren Raceravid Yelverton, MD 05/21/15 [phone]

## 2015-06-10 ENCOUNTER — Encounter (HOSPITAL_COMMUNITY): Payer: Self-pay | Admitting: Emergency Medicine

## 2015-06-10 ENCOUNTER — Emergency Department (HOSPITAL_COMMUNITY)
Admission: EM | Admit: 2015-06-10 | Discharge: 2015-06-11 | Disposition: A | Discharge status: Home / Self Care | Payer: 59 | Attending: Emergency Medicine | Admitting: Emergency Medicine

## 2015-06-10 DIAGNOSIS — J45909 Unspecified asthma, uncomplicated: Secondary | ICD-10-CM | POA: Diagnosis not present

## 2015-06-10 DIAGNOSIS — R197 Diarrhea, unspecified: Secondary | ICD-10-CM | POA: Insufficient documentation

## 2015-06-10 DIAGNOSIS — R112 Nausea with vomiting, unspecified: Secondary | ICD-10-CM | POA: Diagnosis not present

## 2015-06-10 DIAGNOSIS — R0981 Nasal congestion: Secondary | ICD-10-CM | POA: Diagnosis not present

## 2015-06-10 DIAGNOSIS — F17210 Nicotine dependence, cigarettes, uncomplicated: Secondary | ICD-10-CM | POA: Diagnosis not present

## 2015-06-10 LAB — CBC
HCT: 44.4 % (ref 39.0–52.0)
Hemoglobin: 14.2 g/dL (ref 13.0–17.0)
MCH: 27 pg (ref 26.0–34.0)
MCHC: 32 g/dL (ref 30.0–36.0)
MCV: 84.4 fL (ref 78.0–100.0)
PLATELETS: 168 10*3/uL (ref 150–400)
RBC: 5.26 MIL/uL (ref 4.22–5.81)
RDW: 13.9 % (ref 11.5–15.5)
WBC: 5.7 10*3/uL (ref 4.0–10.5)

## 2015-06-10 LAB — COMPREHENSIVE METABOLIC PANEL
ALT: 49 U/L (ref 17–63)
AST: 32 U/L (ref 15–41)
Albumin: 4 g/dL (ref 3.5–5.0)
Alkaline Phosphatase: 98 U/L (ref 38–126)
Anion gap: 7 (ref 5–15)
BILIRUBIN TOTAL: 0.4 mg/dL (ref 0.3–1.2)
BUN: 13 mg/dL (ref 6–20)
CALCIUM: 9.6 mg/dL (ref 8.9–10.3)
CHLORIDE: 107 mmol/L (ref 101–111)
CO2: 24 mmol/L (ref 22–32)
CREATININE: 1.31 mg/dL — AB (ref 0.61–1.24)
Glucose, Bld: 103 mg/dL — ABNORMAL HIGH (ref 65–99)
Potassium: 4 mmol/L (ref 3.5–5.1)
Sodium: 138 mmol/L (ref 135–145)
TOTAL PROTEIN: 6.7 g/dL (ref 6.5–8.1)

## 2015-06-10 LAB — LIPASE, BLOOD: LIPASE: 23 U/L (ref 11–51)

## 2015-06-10 NOTE — ED Notes (Signed)
Pt sts N/V/D x 2 days with nasal congestion

## 2015-07-24 ENCOUNTER — Emergency Department (HOSPITAL_COMMUNITY)
Admission: EM | Admit: 2015-07-24 | Discharge: 2015-07-24 | Discharge status: Against Medical Advice | Payer: No Typology Code available for payment source

## 2015-07-24 NOTE — ED Notes (Signed)
Called pt no response.  

## 2015-07-24 NOTE — ED Notes (Signed)
Called for triage x2 No response

## 2015-08-05 ENCOUNTER — Encounter: Payer: Self-pay | Admitting: Medical

## 2015-08-06 ENCOUNTER — Telehealth: Payer: Self-pay

## 2015-08-06 ENCOUNTER — Encounter: Payer: Self-pay | Admitting: Medical

## 2015-08-06 NOTE — Telephone Encounter (Signed)
This patient no showed for their appointment today.Which of the following is necessary for this patient.   A) No follow-up necessary   B) Follow-up urgent. Locate Patient Immediately.   C) Follow-up necessary. Contact patient and Schedule visit in ____ Days.   D) Follow-up Advised. Contact patient and Schedule visit in ____ Days.  New PT CPE can not be rescheduled.

## 2015-08-06 NOTE — Telephone Encounter (Signed)
D, new patient no show can't reschedule.

## 2015-08-07 NOTE — Telephone Encounter (Signed)
Chart noted.

## 2015-08-12 ENCOUNTER — Telehealth: Payer: Self-pay | Admitting: Medical

## 2015-08-12 IMAGING — CR DG CHEST 2V
2 series · 2 of 2 positions shown · non-contrast
Comparison: March 29, 2009

CLINICAL DATA: Chest pain and cough

EXAM:
CHEST  2 VIEW

[w chest pa]
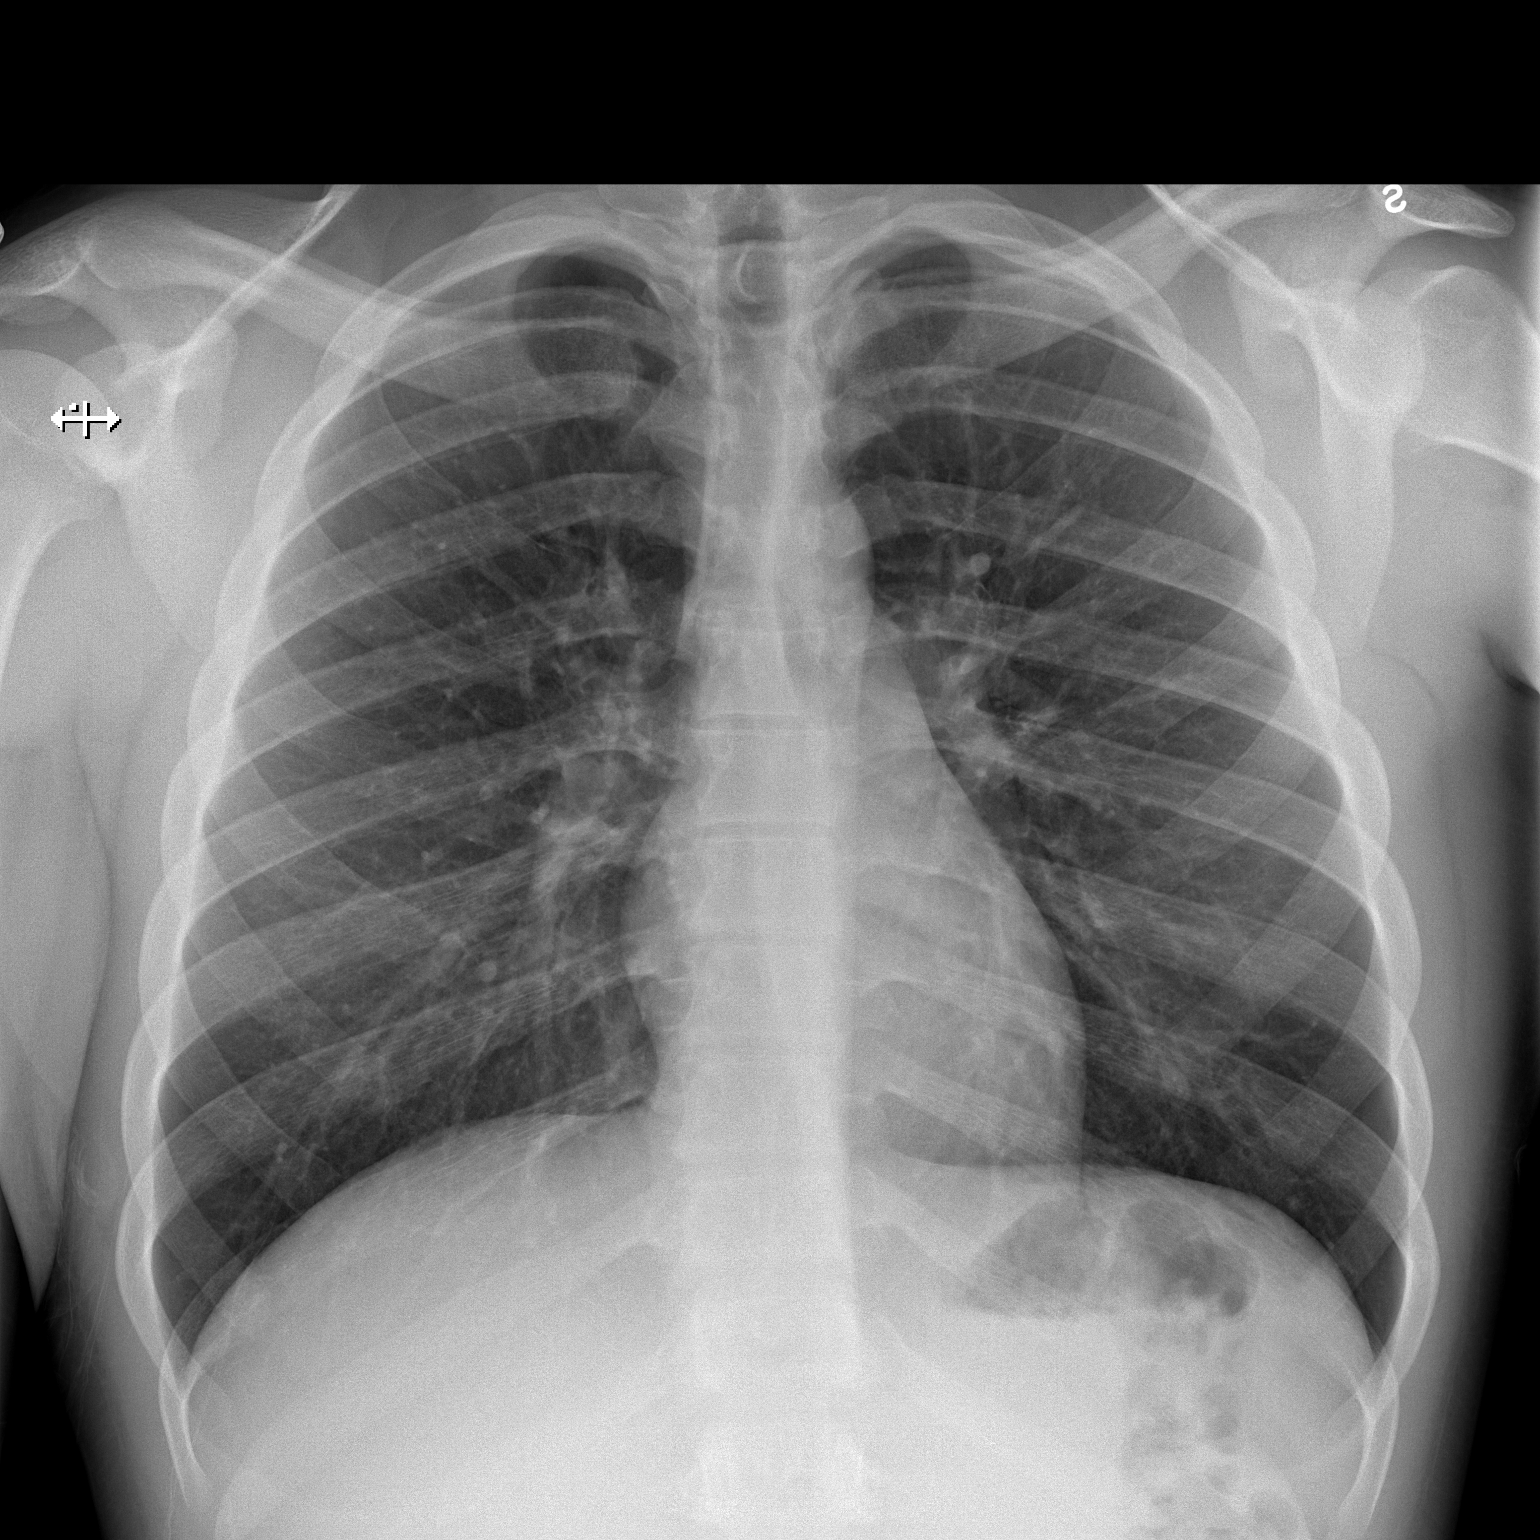

[w chest lat]
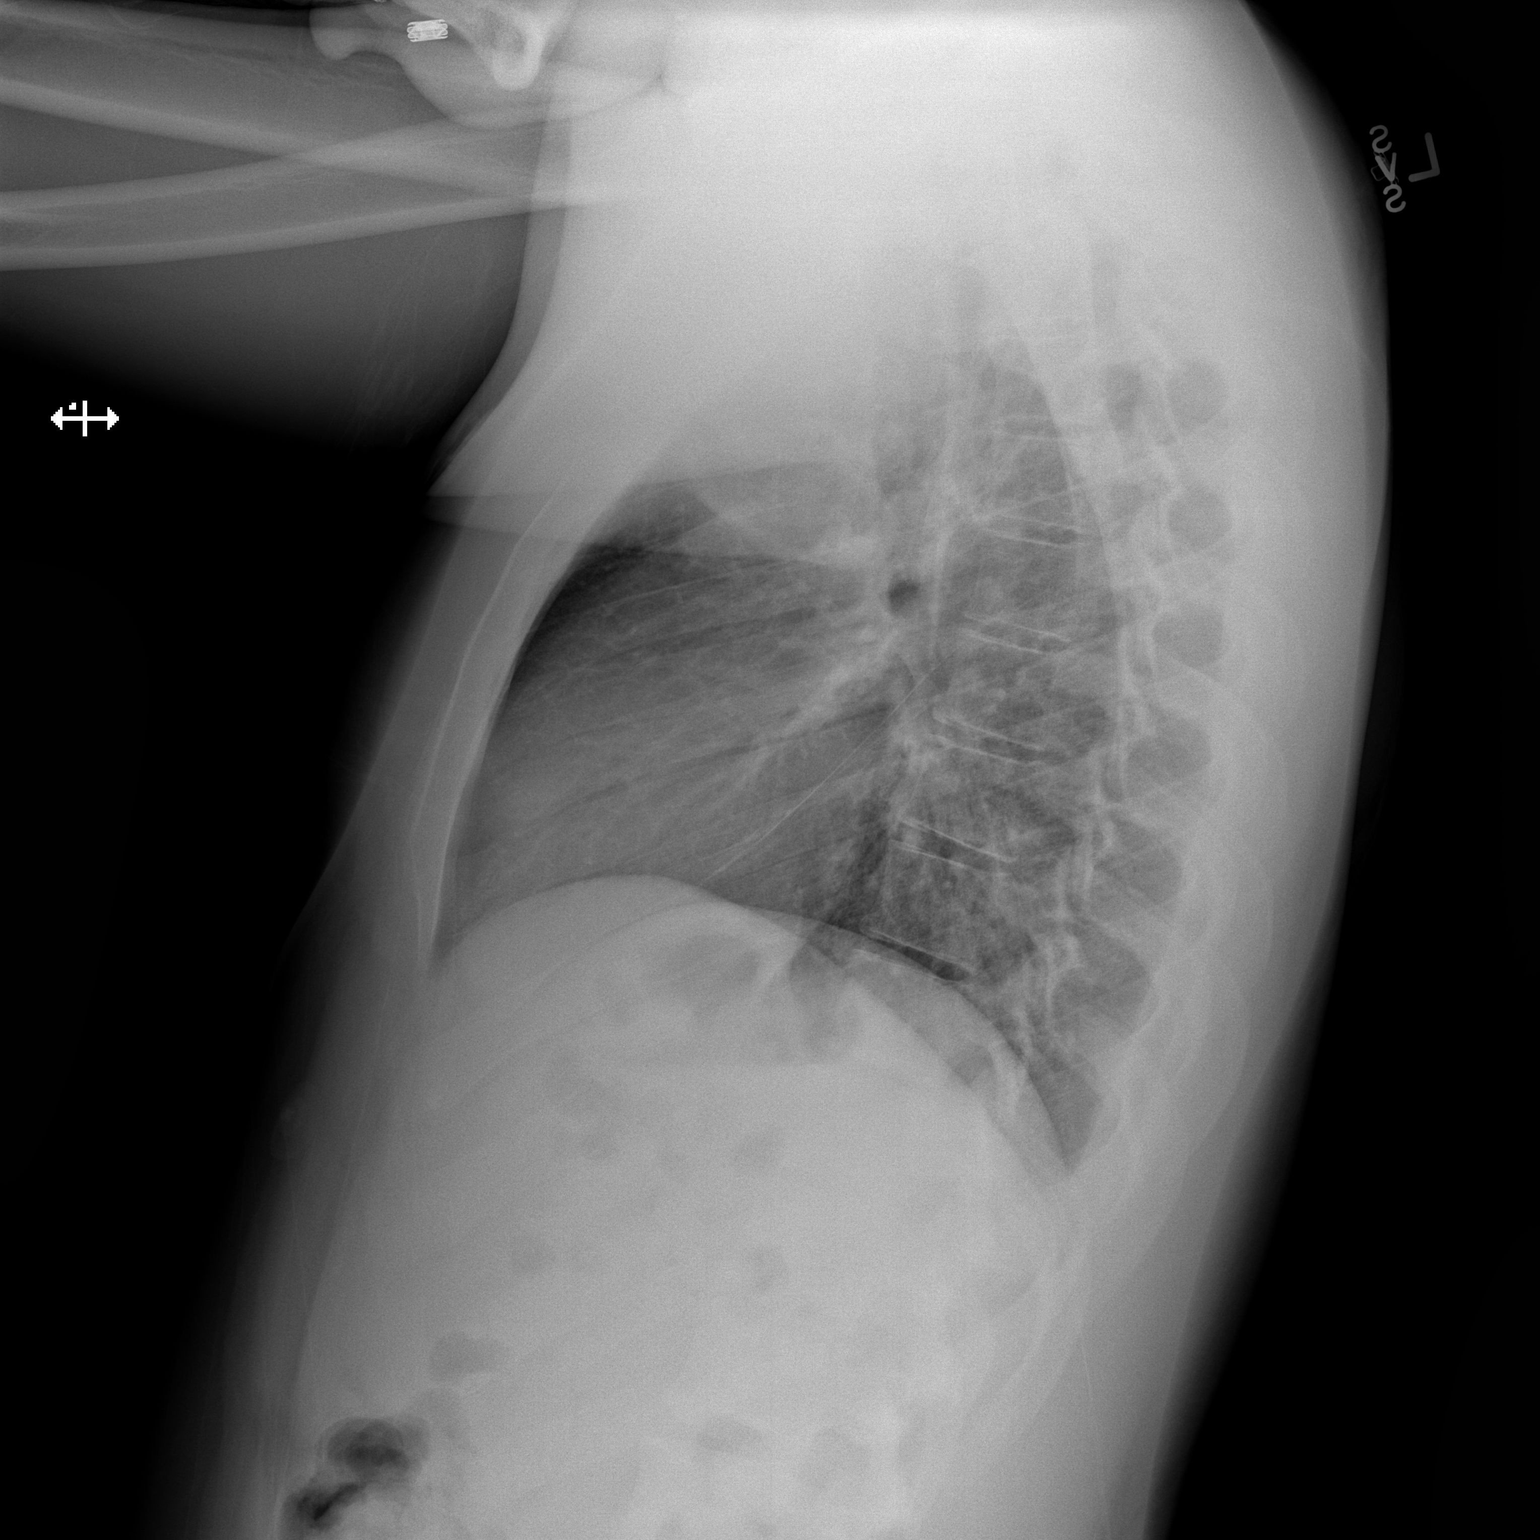

[2 of 2 positions shown; findings below may reference images not displayed]

FINDINGS: The lungs are clear. The heart size and pulmonary vascularity are
normal. No pneumothorax. No adenopathy. No bone lesions.
IMPRESSION: No abnormality noted.

## 2015-08-12 NOTE — Telephone Encounter (Signed)
Spoke to mom, made her aware of non show policy, updated phone number on account, scheduled CPE

## 2015-08-12 NOTE — Telephone Encounter (Signed)
Pt's mom, Jamaire Declerck, called saying that she just realized that pt missed his new pt appt with Vincenza Hews on 7/26. She thought the appt was today and mom said she did not get a reminder call but told her that her phone # was not listed as the number to call. Mom is a pt of Shane's and she said her husband is also a pt in our office so she would really like for Vincenza Hews and our office to please reconsider allowing her son to be a pt here again. Ivor Messier can be reached at [phone]

## 2015-08-12 NOTE — Telephone Encounter (Signed)
I guess reschedule, but make sure proper phone numbers in computer, and aware of no show fee

## 2015-09-10 ENCOUNTER — Emergency Department (HOSPITAL_COMMUNITY)
Admission: EM | Admit: 2015-09-10 | Discharge: 2015-09-10 | Disposition: A | Discharge status: Other Acute Inpatient Hospital | Payer: 59 | Attending: Emergency Medicine | Admitting: Emergency Medicine

## 2015-09-10 ENCOUNTER — Encounter (HOSPITAL_COMMUNITY): Payer: Self-pay | Admitting: Emergency Medicine

## 2015-09-10 ENCOUNTER — Inpatient Hospital Stay (HOSPITAL_COMMUNITY)
Admission: AD | Admit: 2015-09-10 | Discharge: 2015-09-12 | DRG: 885 | Disposition: A | Discharge status: Home / Self Care | Payer: 59 | Source: Intra-hospital | Attending: Psychiatry | Admitting: Psychiatry

## 2015-09-10 DIAGNOSIS — Z79899 Other long term (current) drug therapy: Secondary | ICD-10-CM

## 2015-09-10 DIAGNOSIS — J45909 Unspecified asthma, uncomplicated: Secondary | ICD-10-CM | POA: Diagnosis present

## 2015-09-10 DIAGNOSIS — R45851 Suicidal ideations: Secondary | ICD-10-CM | POA: Diagnosis present

## 2015-09-10 DIAGNOSIS — F17210 Nicotine dependence, cigarettes, uncomplicated: Secondary | ICD-10-CM | POA: Insufficient documentation

## 2015-09-10 DIAGNOSIS — F332 Major depressive disorder, recurrent severe without psychotic features: Secondary | ICD-10-CM | POA: Diagnosis present

## 2015-09-10 DIAGNOSIS — Z5181 Encounter for therapeutic drug level monitoring: Secondary | ICD-10-CM | POA: Diagnosis not present

## 2015-09-10 DIAGNOSIS — Z915 Personal history of self-harm: Secondary | ICD-10-CM | POA: Diagnosis not present

## 2015-09-10 DIAGNOSIS — G4700 Insomnia, unspecified: Secondary | ICD-10-CM | POA: Diagnosis present

## 2015-09-10 DIAGNOSIS — F909 Attention-deficit hyperactivity disorder, unspecified type: Secondary | ICD-10-CM | POA: Insufficient documentation

## 2015-09-10 LAB — COMPREHENSIVE METABOLIC PANEL
ALBUMIN: 4.6 g/dL (ref 3.5–5.0)
ALT: 42 U/L (ref 17–63)
ANION GAP: 7 (ref 5–15)
AST: 28 U/L (ref 15–41)
Alkaline Phosphatase: 80 U/L (ref 38–126)
BUN: 8 mg/dL (ref 6–20)
CO2: 24 mmol/L (ref 22–32)
Calcium: 9.8 mg/dL (ref 8.9–10.3)
Chloride: 108 mmol/L (ref 101–111)
Creatinine, Ser: 1.16 mg/dL (ref 0.61–1.24)
GFR calc Af Amer: >60 mL/min (ref >60)
GFR calc non Af Amer: >60 mL/min (ref >60)
GLUCOSE: 95 mg/dL (ref 65–99)
POTASSIUM: 3.9 mmol/L (ref 3.5–5.1)
SODIUM: 139 mmol/L (ref 135–145)
Total Bilirubin: 0.6 mg/dL (ref 0.3–1.2)
Total Protein: 7.7 g/dL (ref 6.5–8.1)

## 2015-09-10 LAB — RAPID URINE DRUG SCREEN, HOSP PERFORMED
AMPHETAMINES: NOT DETECTED
BARBITURATES: NOT DETECTED
BENZODIAZEPINES: NOT DETECTED
COCAINE: NOT DETECTED
Opiates: NOT DETECTED
TETRAHYDROCANNABINOL: POSITIVE — AB

## 2015-09-10 LAB — ETHANOL: Alcohol, Ethyl (B): <5 mg/dL (ref <5)

## 2015-09-10 LAB — ACETAMINOPHEN LEVEL

## 2015-09-10 LAB — CBC
HEMATOCRIT: 48.1 % (ref 39.0–52.0)
HEMOGLOBIN: 16.2 g/dL (ref 13.0–17.0)
MCH: 28.2 pg (ref 26.0–34.0)
MCHC: 33.7 g/dL (ref 30.0–36.0)
MCV: 83.7 fL (ref 78.0–100.0)
Platelets: 161 10*3/uL (ref 150–400)
RBC: 5.75 MIL/uL (ref 4.22–5.81)
RDW: 14.3 % (ref 11.5–15.5)
WBC: 6 10*3/uL (ref 4.0–10.5)

## 2015-09-10 LAB — SALICYLATE LEVEL: Salicylate Lvl: <4 mg/dL (ref 2.8–30.0)

## 2015-09-10 MED ORDER — ONDANSETRON HCL 4 MG PO TABS: 4.0000 mg | ORAL_TABLET | Freq: Three times a day (TID) | ORAL | Status: DC | PRN

## 2015-09-10 MED ORDER — ALUM & MAG HYDROXIDE-SIMETH 200-200-20 MG/5ML PO SUSP: 30.0000 mL | ORAL | Status: DC | PRN

## 2015-09-10 MED ORDER — LAMOTRIGINE 25 MG PO TABS
25.0000 mg | ORAL_TABLET | Freq: Every day | ORAL | Status: DC
  Administered 2015-09-11 – 2015-09-12 (×2): 25 mg via ORAL
  Filled 2015-09-10 (×4): qty 1

## 2015-09-10 MED ORDER — TRAZODONE HCL 50 MG PO TABS
50.0000 mg | ORAL_TABLET | Freq: Every evening | ORAL | Status: DC | PRN
  Administered 2015-09-10: 50 mg via ORAL
  Filled 2015-09-10 (×2): qty 1

## 2015-09-10 MED ORDER — IBUPROFEN 600 MG PO TABS
600.0000 mg | ORAL_TABLET | Freq: Three times a day (TID) | ORAL | Status: DC | PRN
  Administered 2015-09-11: 600 mg via ORAL
  Filled 2015-09-10: qty 1

## 2015-09-10 MED ORDER — ACETAMINOPHEN 325 MG PO TABS: 650.0000 mg | ORAL_TABLET | ORAL | Status: DC | PRN

## 2015-09-10 MED ORDER — IBUPROFEN 200 MG PO TABS: 600.0000 mg | ORAL_TABLET | Freq: Three times a day (TID) | ORAL | Status: DC | PRN

## 2015-09-10 MED ORDER — ZOLPIDEM TARTRATE 5 MG PO TABS: 5.0000 mg | ORAL_TABLET | Freq: Every evening | ORAL | Status: DC | PRN

## 2015-09-10 MED ORDER — TRAZODONE HCL 50 MG PO TABS: 50.0000 mg | ORAL_TABLET | Freq: Every evening | ORAL | Status: DC | PRN

## 2015-09-10 MED ORDER — LAMOTRIGINE 25 MG PO TABS
25.0000 mg | ORAL_TABLET | Freq: Every day | ORAL | Status: DC
  Administered 2015-09-10: 25 mg via ORAL
  Filled 2015-09-10: qty 1

## 2015-09-10 NOTE — ED Notes (Signed)
Patient has one bag of belongings in locker 29. 

## 2015-09-10 NOTE — ED Notes (Signed)
Pt admitted to room #42. Pt behavior calm and cooperative, pleasant on approach. Pt reports he came to the emergency room d/t suicidal thoughts without plan. Pt endorsing SI, reports increase in depression. Denies HI. Denies AVH. Pt reports suicide attempt in April by overdose. Pt unable to identify any stressors. Pt reports he is compliant with prescribed medication regimen. 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 Pt denies drug and alcohol use. Special checks q 15 mins in place for safety. Video monitoring in place. Will continue to monitor.

## 2015-09-10 NOTE — ED Provider Notes (Signed)
[facility]-EMERGENCY DEPT Provider Note   CSN: [id number] Arrival date & time: 09/10/15  1059  By signing my name below, I, Freida Busmaniana Omoyeni, attest that this documentation has been prepared under the direction and in the presence of non-physician practitioner, Ebbie Ridgehris Saliha Salts, PA-C. Electronically Signed: Freida Busmaniana Omoyeni, Scribe. 09/10/2015. 12:46 PM.   History   Chief Complaint Chief Complaint  Patient presents with  . Suicidal   HPI Comments:  Warren Mcbride is a 20 y.o. male with a history of anxiety, ADHD and depression who presents to the Emergency Department complaining of SI without plan x 2 weeks,  worse for the last 4 days. Pt has a h/o same; notes he is  on meds and is compliant. He admits he has tried to hurt himself in the past. He also admits to occasional alcohol and marijuana use. No alleviating factors noted. Pt has no physical complaints or symptoms at this time.    The history is provided by the patient. No language interpreter was used.    Past Medical History:  Diagnosis Date  . ADHD (attention deficit hyperactivity disorder)   . Anxiety   . Asthma   . Depression   . 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 Johney FrameAllred - no PPM indicated at the time; AV block due to increased vagal tone     Patient Active Problem List   Diagnosis Date Noted  . Depression 05/10/2015  . Bipolar I disorder, most recent episode depressed (HCC) 05/08/2015  . Polysubstance dependence including opioid type drug, episodic abuse (HCC) 05/08/2015  . Overdose of benzodiazepine   . Sinus pause   . Overdose 05/07/2015  . Tachypnea 05/07/2015  . History of asthma 05/07/2015  . Blurred vision 05/07/2015  . First degree AV block 05/07/2015  . Syncope 05/18/2013    Past Surgical History:  Procedure Laterality Date  . NO PAST SURGERIES        Home Medications    Prior to Admission medications   Medication Sig Start Date End Date Taking? Authorizing Provider  lamoTRIgine  (LAMICTAL) 25 MG tablet Take 1 tablet (25 mg total) by mouth daily. For mood stabilization 05/12/15  Yes Sanjuana KavaAgnes I Nwoko, NP  traZODone (DESYREL) 50 MG tablet Take 1 tablet (50 mg total) by mouth at bedtime as needed and may repeat dose one time if needed for sleep. 05/12/15  Yes Sanjuana KavaAgnes I Nwoko, NP    Family History No family history on file.  Social History Social History  Substance Use Topics  . Smoking status: Current Every Day Smoker    Packs/day: 0.25    Years: 4.00    Types: Cigarettes  . Smokeless tobacco: Never Used  . Alcohol use Yes     Comment: occasional      Allergies   Review of patient's allergies indicates no known allergies.   Review of Systems Review of Systems  Constitutional: Negative for chills and fever.  Respiratory: Negative for shortness of breath.   Cardiovascular: Negative for chest pain.  Psychiatric/Behavioral: Positive for suicidal ideas. Negative for self-injury.  All other systems reviewed and are negative.   Physical Exam Updated Vital Signs BP 116/70 (BP Location: Left Arm)   Pulse 64   Temp 98 F (36.7 C) (Oral)   Resp 18   SpO2 100%   Physical Exam  Constitutional: He is oriented to person, place, and time. He appears well-developed and well-nourished. No distress.  HENT:  Head: Normocephalic and atraumatic.  Eyes: Conjunctivae  are normal.  Cardiovascular: Normal rate and regular rhythm.   Pulmonary/Chest: Effort normal and breath sounds normal. No respiratory distress.  Abdominal: He exhibits no distension.  Neurological: He is alert and oriented to person, place, and time.  Skin: Skin is warm and dry.  Psychiatric: He expresses suicidal ideation.  Suicidal ideation but otherwise pleasant   Nursing note and vitals reviewed.   ED Treatments / Results  DIAGNOSTIC STUDIES:  Oxygen Saturation is 100% on room air, normal by my interpretation.    COORDINATION OF CARE:  12:40 PM Will order blood work and TTS consult. Discussed  treatment plan with pt at bedside and pt agreed to plan.  Labs (all labs ordered are listed, but only abnormal results are displayed) Labs Reviewed  ACETAMINOPHEN LEVEL - Abnormal; Notable for the following:       Result Value   Acetaminophen (Tylenol), Serum <10 (*)    All other components within normal limits  URINE RAPID DRUG SCREEN, HOSP PERFORMED - Abnormal; Notable for the following:    Tetrahydrocannabinol POSITIVE (*)    All other components within normal limits  COMPREHENSIVE METABOLIC PANEL  ETHANOL  SALICYLATE LEVEL  CBC    EKG  EKG Interpretation None       Radiology No results found.  Procedures Procedures (including critical care time)  Medications Ordered in ED Medications  acetaminophen (TYLENOL) tablet 650 mg (not administered)  ibuprofen (ADVIL,MOTRIN) tablet 600 mg (not administered)  zolpidem (AMBIEN) tablet 5 mg (not administered)  ondansetron (ZOFRAN) tablet 4 mg (not administered)  alum & mag hydroxide-simeth (MAALOX/MYLANTA) 200-200-20 MG/5ML suspension 30 mL (not administered)  lamoTRIgine (LAMICTAL) tablet 25 mg (not administered)  traZODone (DESYREL) tablet 50 mg (not administered)     Initial Impression / Assessment and Plan / ED Course  I have reviewed the triage vital signs and the nursing notes.  Pertinent labs & imaging results that were available during my care of the patient were reviewed by me and considered in my medical decision making (see chart for details).  Clinical Course     Final Clinical Impressions(s) / ED Diagnoses   Final diagnoses:  None    New Prescriptions New Prescriptions   No medications on file   I personally performed the services described in this documentation, which was scribed in my presence. The recorded information has been reviewed and is accurate.     Charlestine Night, PA-C 09/10/15 2203    Derwood Kaplan, MD 09/10/15 2244

## 2015-09-10 NOTE — ED Provider Notes (Signed)
  Physical Exam  BP 116/70 (BP Location: Left Arm)   Pulse 64   Temp 98 F (36.7 C) (Oral)   Resp 18   SpO2 100%   Physical Exam  ED Course  Procedures  MDM [name]       Warren Lick, MD 09/10/15 [phone]

## 2015-09-10 NOTE — ED Notes (Signed)
Patient out at nurse's desk using phone to call his mother.   Patient's scrub pants split, patient given new scrub pants.

## 2015-09-10 NOTE — [facility] Assessment (Signed)
[facility] Assessment Progress Note  Per [name], NP, this pt requires psychiatric hospitalization at this time.  Warren AbedLindsay, RN, [facility]; they will be ready to receive pt at 20:30.  Pt has signed Voluntary Admission and Consent for Treatment, as well as Consent to Release Information to his probation officer, and signed [facility].  Pt's nurse, [name], has been notified, and agrees to send original paperwork along with pt via [name], and to call report to [phone].  [name], MA Triage Specialist [phone]

## 2015-09-10 NOTE — Progress Notes (Signed)
Patient A/O, no noted distress. Gave report to [place], Charity fundraiserN. [facility] is ready to accept the patient. Educated patient on receiving hospital. All documents have been signed and completed. Staff will continue to maintain safety.

## 2015-09-10 NOTE — ED Notes (Signed)
Bed: [facility] Expected date:  Expected time:  Means of arrival:  Comments: 

## 2015-09-10 NOTE — BH Assessment (Addendum)
Assessment Note  Warren Mcbride is an 20 y.o. male with history of ADHD, Anxiety, and Depression. Patient contacted mobile crises today due to suicidal thoughts. He was given the contact information by his probation officer. [employer] showed up to his home and escorted him to [facility] for a evaluation. Patient sts that he has felt increasingly depressed and suicidal over the past 4 days. He identifies his medications not working as the trigger for his depression. Patient prescribed Lamictal sts that his medications dosages were changed during his admission to [facility] 04/2015. Patient was admitted to [facility] 04/2015 due to a suicide attempt by overdose on Benzodiazepines. Patient sts that his medications were not working at that time. He does not have a current suicidal plan today; denies intent. He does report depressive symptoms such as fatigue, loss of interest in usual pleasures, and hopelessness. No family history of mental health illness reported. Patient denies HI. He is currently calm and cooperative. He is currently o probation for B&E. No current or new legal charges. No court dates reported. No AVH's. Patient does not have a current mental health therapist or psychiatrist. Sts that had a follow up appointment with a community provider upon discharge from [facility] 04/2015. Sts that the provider's business closed and he never had an opportunity to follow up. Patient reports infrequent alcohol use however drinks heavily when he drinks. Patient has a history of THC and Xanax use.    Diagnosis:  Major Depressive Disorder, Recurrent, Severe, without psychotic features; Anxiety Disorder; Substance Use Disorder; ADHD  Past Medical History:  Past Medical History:  Diagnosis Date  . ADHD (attention deficit hyperactivity disorder)   . Anxiety   . Asthma   . Depression   . 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 Johney Frame - no PPM indicated at the time; AV block due to increased vagal tone     Past Surgical  History:  Procedure Laterality Date  . NO PAST SURGERIES      Family History: No family history on file.  Social History:  reports that he has been smoking Cigarettes.  He has a 1.00 pack-year smoking history. He has never used smokeless tobacco. He reports that he drinks alcohol. He reports that he does not use drugs.  Additional Social History:  Alcohol / Drug Use Pain Medications: SEE MAR Prescriptions: SEE MAR Over the Counter: SEE MAR History of alcohol / drug use?: Yes Substance #1 Name of Substance 1: alcohol  1 - Age of First Use: 20 yrs old  1 - Amount (size/oz): binge drinker 1 - Frequency: 1x every other week 1 - Duration: on-going  1 - Last Use / Amount: 09/09/2015 Substance #2 Name of Substance 2: thc 2 - Age of First Use: 20 yrs old  2 - Amount (size/oz): varies 2 - Frequency: "daily until my probation officer started drug testing me back in April" 2 - Duration: on-goiing  2 - Last Use / Amount: 3 months ago Substance #3 Name of Substance 3: Xanax 3 - Age of First Use: 20 yrs old  3 - Amount (size/oz): varies  3 - Frequency: "not that often" 3 - Duration: since the age of 61 3 - Last Use / Amount: April 2017  CIWA: CIWA-Ar BP: 116/70 Pulse Rate: 64 COWS:    Allergies: No Known Allergies  Home Medications:  (Not in a hospital admission)  OB/GYN Status:  No LMP for male patient.  General Assessment Data Location of Assessment:  [facility] ED TTS Assessment: In system Is this a Tele or Face-to-Face Assessment?: Face-to-Face Is this an Initial Assessment or a Re-assessment for this encounter?: Initial Assessment Marital status: Single Maiden name:  (n/a) Is patient pregnant?: No Pregnancy Status: No Living Arrangements: Other (Comment), Parent, Spouse/significant other ("I live with my girlfriend and mother") Can pt return to current living arrangement?: Yes Admission Status: Voluntary Is patient capable of signing voluntary admission?: Yes Referral  Source: Self/Family/Friend 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 but they closed down") Name of Therapist:  ("I was going to [facility]..but [facility]")  Education Status Is patient currently in school?: No Current Grade:  (n/a) Highest grade of school patient has completed:  (some college) Name of school:  (n/a) Contact person:  (n/a)  Risk to self with the past 6 months Suicidal Ideation: Yes-Currently Present Has patient been a risk to self within the past 6 months prior to admission? : Yes Suicidal Intent: Yes-Currently Present Has patient had any suicidal intent within the past 6 months prior to admission? : Yes Is patient at risk for suicide?: Yes Suicidal Plan?: No Has patient had any suicidal plan within the past 6 months prior to admission? : No Specify Current Suicidal Plan:  (n/a) Access to Means: Yes Specify Access to Suicidal Means:  (no access to firearms and weapons ) What has been your use of drugs/alcohol within the last 12 months?:  (Alcohol and THC; "I have tried Xanax back in April") Previous Attempts/Gestures: No How many times?:  (1x-April 2017 (overdose)) Other Self Harm Risks:  (no self injurious behaviors) Triggers for Past Attempts:  (medications) Intentional Self Injurious Behavior: None Recent stressful life event(s): Other (Comment) ("My medications are not right") Persecutory voices/beliefs?: No Depression: Yes Depression Symptoms: Feeling angry/irritable, Feeling worthless/self pity, Loss of interest in usual pleasures, Guilt, Fatigue, Despondent, Insomnia, Isolating, Tearfulness Substance abuse history and/or treatment for substance abuse?: No Suicide prevention information given to non-admitted  patients: Not applicable  Risk to Others within the past 6 months Homicidal Ideation: No Does patient have any lifetime risk of violence toward others beyond the six months prior to admission? : No Thoughts of Harm to Others: No Comment - Thoughts of Harm to Others:  (bad thoughts for the past 2 weeks ) Current Homicidal Intent: No Current Homicidal Plan: No Access to Homicidal Means: No Identified Victim:  (n/a) History of harm to others?: No Assessment of Violence: None Noted Violent Behavior Description:  (patient calm and cooperative ) Does patient have access to weapons?: No Criminal Charges Pending?: No Does patient have a court date: No Is patient on probation?: Yes ("B & E")  Psychosis Hallucinations: None noted Delusions: None noted  Mental Status Report Appearance/Hygiene: Disheveled Eye Contact: Good Motor Activity: Freedom of movement Speech: Logical/coherent Level of Consciousness: Alert Mood: Depressed, Anxious Affect: Appropriate to circumstance Anxiety Level: Minimal Thought Processes: Coherent, Relevant Judgement: Impaired Orientation: Place, Situation, Time, Person Obsessive Compulsive Thoughts/Behaviors: None  Cognitive Functioning Concentration: Decreased Memory: Recent Intact, Remote Intact IQ: Average Insight: Good Impulse Control: Good Appetite: Fair Weight Loss:  (none reported) Weight Gain:  (none reported) Sleep: No Change Total Hours of Sleep:  (7 hrs per night ) Vegetative Symptoms: None  ADLScreening [facility] Assessment Services) Patient's cognitive ability adequate  to safely complete daily activities?: Yes Patient able to express need for assistance with ADLs?: Yes Independently performs ADLs?: No  Prior Inpatient Therapy Prior Inpatient Therapy: Yes Prior Therapy Dates:  (current) Prior Therapy Facilty/Provider(s):  [facility]) Reason for Treatment:  (suicide attempt)  Prior Outpatient Therapy Prior Outpatient Therapy: Yes Prior  Therapy Dates:  (past ) Prior Therapy Facilty/Provider(s):  ("[facility]" but they closed down ) Reason for Treatment:  (therapy and psychiatry) Does patient have an ACCT team?: No Does patient have Intensive In-House Services?  : No Does patient have [facility] services? : No Does patient have P4CC services?: No  ADL Screening (condition at time of admission) Patient's cognitive ability adequate to safely complete daily activities?: Yes Is the patient deaf or have difficulty hearing?: No Does the patient have difficulty seeing, even when wearing glasses/contacts?: No Does the patient have difficulty concentrating, remembering, or making decisions?: No Patient able to express need for assistance with ADLs?: Yes Does the patient have difficulty dressing or bathing?: No Independently performs ADLs?: No Communication: Independent Dressing (OT): Independent Grooming: Independent Feeding: Independent Bathing: Independent Toileting: Independent In/Out Bed: Independent Walks in Home: Independent Does the patient have difficulty walking or climbing stairs?: No Weakness of Legs: None Weakness of Arms/Hands: None  Home Assistive Devices/Equipment Home Assistive Devices/Equipment: None    Abuse/Neglect Assessment (Assessment to be complete while patient is alone) Physical Abuse: Denies Verbal Abuse: Denies Sexual Abuse: Denies Exploitation of patient/patient's resources: Denies Self-Neglect: Denies Values / Beliefs Cultural Requests During Hospitalization: None Spiritual Requests During Hospitalization: None   Advance Directives (For Healthcare) Does patient have an advance directive?: No Would patient like information on creating an advanced directive?: No - patient declined information Nutrition Screen- [facility] Patient's home diet: Regular  Additional Information 1:1 In Past 12 Months?: No CIRT Risk: No Elopement Risk: No Does patient have medical clearance?:  Yes     Disposition:  Disposition Initial Assessment Completed for this Encounter: Yes  On Site Evaluation by:   Reviewed with Physician: Drenda Freeze, NP   Consulted with Drenda Freeze, NP and INPT treatment was recommended. Patient sts that he has suicidal thoughts. He is also a high risk patient with recent suicide attempt. Last suicide was 04/2015 (overdose).   Melynda Ripple [facility] 09/10/2015 2:12 PM

## 2015-09-10 NOTE — ED Triage Notes (Signed)
Patient states that he has been having thoughts of SI for about 2 weeks but got really bad today. Patient called crisis hotline that his probation office gave him and they told patient to contact 911, so police brought patient in voluntarily.  Patient denies having a plan of how wants to hurt himself at this time as to why he is seeking help "before it gets to that point".

## 2015-09-10 NOTE — ED Notes (Signed)
[facility] [place]

## 2015-09-11 ENCOUNTER — Encounter (HOSPITAL_COMMUNITY): Payer: Self-pay

## 2015-09-11 DIAGNOSIS — R45851 Suicidal ideations: Secondary | ICD-10-CM

## 2015-09-11 DIAGNOSIS — F332 Major depressive disorder, recurrent severe without psychotic features: Principal | ICD-10-CM

## 2015-09-11 DIAGNOSIS — Z79899 Other long term (current) drug therapy: Secondary | ICD-10-CM

## 2015-09-11 MED ORDER — NICOTINE 21 MG/24HR TD PT24
21.0000 mg | MEDICATED_PATCH | Freq: Every day | TRANSDERMAL | Status: DC
  Filled 2015-09-11 (×3): qty 1

## 2015-09-11 MED ORDER — CITALOPRAM HYDROBROMIDE 20 MG PO TABS
20.0000 mg | ORAL_TABLET | Freq: Every day | ORAL | Status: DC
  Administered 2015-09-11 – 2015-09-12 (×2): 20 mg via ORAL
  Filled 2015-09-11 (×3): qty 1

## 2015-09-11 MED ORDER — METHOCARBAMOL 500 MG PO TABS
750.0000 mg | ORAL_TABLET | Freq: Once | ORAL | Status: AC
  Administered 2015-09-11: 750 mg via ORAL
  Filled 2015-09-11: qty 1
  Filled 2015-09-11: qty 2

## 2015-09-11 NOTE — Progress Notes (Signed)
[facility] Group Notes:  (Nursing/MHT/Case Management/Adjunct)  Date:  09/11/2015  Time:  11:50 PM  Type of Therapy:  Psychoeducational Skills  Participation Level:  Minimal  Participation Quality:  Attentive  Affect:  Blunted  Cognitive:  Appropriate  Insight:  Appropriate  Engagement in Group:  Developing/Improving  Modes of Intervention:  Education  Summary of Progress/Problems: The patient shared with the group that he had a good family visit this evening. No additional details were provided about his day. The patient's goal for tomorrow is to try and stay positive.   Hazle CocaGOODMAN, Sims Laday S 09/11/2015, 11:50 PM

## 2015-09-11 NOTE — [facility] Group Notes (Signed)
[facility] Group Notes:  (Nursing/MHT/Case Management/Adjunct)  Date:  09/11/2015  Time:  9:16 AM  Type of Therapy:  Psychoeducational Skills  Participation Level:  Did Not Attend  Participation Quality:  N/A  Affect:  N/A  Cognitive:  N/A  Insight:  None  Engagement in Group:  None  Modes of Intervention:  Discussion and Education  Summary of Progress/Problems: Patient invited but did not attend group.   Ruven Corradi E 09/11/2015, 9:16 AM 

## 2015-09-11 NOTE — [facility] Suicide Risk Assessment (Signed)
[facility] Admission Suicide Risk Assessment   Nursing information obtained from:    Demographic factors:    Current Mental Status:    Loss Factors:    Historical Factors:    Risk Reduction Factors:     Total Time spent with patient: 1 hour Principal Problem: <principal problem not specified> Diagnosis:   Patient Active Problem List   Diagnosis Date Noted  . MDD (major depressive disorder), recurrent severe, without psychosis (HCC) [F33.2] 09/10/2015  . Depression [F32.9] 05/10/2015  . Bipolar I disorder, most recent episode depressed (HCC) [F31.30] 05/08/2015  . Polysubstance dependence including opioid type drug, episodic abuse (HCC) [F11.20, F19.20] 05/08/2015  . Overdose of benzodiazepine [T42.4X1A]   . Sinus pause [I45.5]   . Overdose [T50.901A] 05/07/2015  . Tachypnea [R06.82] 05/07/2015  . History of asthma [Z87.09] 05/07/2015  . Blurred vision [H53.8] 05/07/2015  . 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 Syncope [R55] 05/18/2013   Subjective Data:  20 year old male  With prior history of Depression admitted voluntarily because of one week of depressive symptoms including suicidal ideation without a plan He called the crisis line who advised him to call 911 who brought him to [facility]. Continued Clinical Symptoms:  Alcohol Use Disorder Identification Test Final Score (AUDIT): 8 The "Alcohol Use Disorders Identification Test", Guidelines for Use in Primary Care, Second Edition.  World Science writer [facility]). Score between 0-7:  no or low risk or alcohol related problems. Score between 8-15:  moderate risk of alcohol related problems. Score between 16-19:  high risk of alcohol related problems. Score 20 or above:  warrants further diagnostic evaluation for alcohol dependence and treatment.   CLINICAL FACTORS:   Depression:   Anhedonia Hopelessness Impulsivity Insomnia Postpartum Depression   Musculoskeletal: Strength & Muscle Tone: within normal limits Gait  & Station: normal Patient leans: N/A  Psychiatric Specialty Exam: Physical Exam  ROS  Blood pressure 133/68, pulse 80, temperature 98.5 F (36.9 C), temperature source Oral, resp. rate 16, height 5' 9.25" (1.759 m), weight 122.9 kg (271 lb).Body mass index is 39.73 kg/m.   General Appearance: Casual  Eye Contact:  Good  Speech:  Clear and Coherent and Normal Rate  Volume:  Normal  Mood:  Depressed  Affect:  Congruent  Thought Process:  Coherent and Goal Directed  Orientation:  Full (Time, Place, and Person)  Thought Content:  Logical  Suicidal Thoughts:  Yes.  without intent/plan  Homicidal Thoughts:  No  Memory:  Immediate;   Fair Recent;   Fair Remote;   Fair  Judgement:  Fair  Insight:  Fair  Psychomotor Activity:  Increased  Concentration:  Attention Span: Fair  Recall:  [employer] of Knowledge:  Fair  Language:  Fair  Akathisia:  No  Handed:  Right  AIMS (if indicated):     Assets:  Communication Skills Desire for Improvement Financial Resources/Insurance Housing Intimacy Leisure Time Physical Health Social Support Talents/Skills Transportation Vocational/Educational  ADL's:  Intact  Cognition:  WNL  Sleep:  Number of Hours: 6    Musculoskeletal: Strength & Muscle Tone: within normal limits Gait & Station: normal Patient leans: N/A  Psychiatric Specialty Exam: Physical Exam  ROS  Blood pressure 133/68, pulse 80, temperature 98.5 F (36.9 C), temperature source Oral, resp. rate 16, height 5' 9.25" (1.759 m), weight 122.9 kg (271 lb).Body mass index is 39.73 kg/m.  General Appearance: Casual  Eye Contact:  Good  Speech:  Clear and Coherent and Normal Rate  Volume:  Normal  Mood:  Depressed  Affect:  Congruent  Thought Process:  Coherent and Goal Directed  Orientation:  Full (Time, Place, and Person)  Thought Content:  Logical  Suicidal Thoughts:  Yes.  without intent/plan  Homicidal Thoughts:  No  Memory:  Immediate;   Fair Recent;    Fair Remote;   Fair  Judgement:  Fair  Insight:  Fair  Psychomotor Activity:  Increased  Concentration:  Attention Span: Fair  Recall:  Fair  Fund of Kno[employer]ervwledge:  Fair  Language:  Fair  Akathisia:  No  Handed:  Right  AIMS (if indicated):     Assets:  Communication Skills Desire for Improvement Financial Resources/Insurance Housing Intimacy Leisure Time Physical Health Social Support Talents/Skills Transportation Vocational/Educational  ADL's:  Intact  Cognition:  WNL  Sleep:  Number of Hours: 6      COGNITIVE FEATURES THAT CONTRIBUTE TO RISK:  None    SUICIDE RISK:   Mild:  Suicidal ideation of limited frequency, intensity, duration, and specificity.  There are no identifiable plans, no associated intent, mild dysphoria and related symptoms, good self-control (both objective and subjective assessment), few other risk factors, and identifiable protective factors, including available and accessible social support.   PLAN OF CAREDaily contact with patient to assess and evaluate symptoms and progress in treatment, Medication management and Plan to restart lamictal and trazodone and add celexa  I certify that inpatient services furnished can reasonably be expected to improve the patient's condition.  Wynelle BourgeoisUreh N Lekauwa, MD 09/11/2015, 11:46 AM

## 2015-09-11 NOTE — Plan of Care (Signed)
Problem: Education: Goal: Ability to state activities that reduce stress will improve Outcome: Progressing Pt. states walking and reading help him cope with stress.

## 2015-09-11 NOTE — Tx Team (Signed)
Initial Treatment Plan 09/11/2015 12:37 AM Warren Doomashawn Luka [id number]    PATIENT STRESSORS: Medication change or noncompliance Substance abuse   PATIENT STRENGTHS: Ability for insight Active sense of humor Average or above average intelligence Capable of independent living Communication skills General fund of knowledge Motivation for treatment/growth Physical Health Religious Affiliation Special hobby/interest Supportive family/friends Work skills   PATIENT IDENTIFIED PROBLEMS: Depression   Anxiety   "been having SI thoughts"   ""get my medications work out"                DISCHARGE CRITERIA:  Ability to meet basic life and health needs Improved stabilization in mood, thinking, and/or behavior Motivation to continue treatment in a less acute level of care Reduction of life-threatening or endangering symptoms to within safe limits Verbal commitment to aftercare and medication compliance  PRELIMINARY DISCHARGE PLAN: Attend 12-step recovery group Return to previous living arrangement Return to previous work or school arrangements  PATIENT/FAMILY INVOLVEMENT: This treatment plan has been presented to and reviewed with the patient, Warren Mcbride.  Tyrone AppleEmily  Teanna Elem, RN 09/11/2015, 12:37 AM

## 2015-09-11 NOTE — H&P (Signed)
Psychiatric Admission Assessment Adult  Patient Identification: Warren Mcbride MRN:  [id number] Date of Evaluation:  09/11/2015 Chief Complaint:  MDD RECURRENT; SEVERE WITHOUT PSYCHOSIS ANXIETY DISORDER  SUBSTANCE USE DISORDER  ADHD Principal Diagnosis: <principal problem not specified> Diagnosis:   Patient Active Problem List   Diagnosis Date Noted  . MDD (major depressive disorder), recurrent severe, without psychosis ([place]) [F33.2] 09/10/2015  . Depression [F32.9] 05/10/2015  . Bipolar I disorder, most recent episode depressed ([place]) [F31.30] 05/08/2015  . Polysubstance dependence including opioid type drug, episodic abuse ([place]) [F11.20, F19.20] 05/08/2015  . Overdose of benzodiazepine [T42.4X1A]   . Sinus pause [I45.5]   . Overdose [T50.901A] 05/07/2015  . Tachypnea [R06.82] 05/07/2015  . History of asthma [Z87.09] 05/07/2015  . Blurred vision [H53.8] 05/07/2015  . 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 Syncope [R55] 05/18/2013   History of Present Illness: 20 year old male  With prior history of Depression admitted voluntarily because of one week of depressive symptoms including suicidal ideation without a plan He called the crisis line who advised him to call 911 who brought him to [facility] ED. Associated Signs/Symptoms: Depression Symptoms:  depressed mood, anhedonia, insomnia, psychomotor agitation, feelings of worthlessness/guilt, difficulty concentrating, hopelessness, recurrent thoughts of death, suicidal thoughts without plan, anxiety, disturbed sleep, (Hypo) Manic Symptoms:  nONE Anxiety Symptoms:  nONE Psychotic Symptoms:  nONE PTSD Symptoms: nONE Total Time spent with patient: 1 hour  Past Psychiatric History: 4 year history of Depression , in outpatient treatment till April of 2017 when admitted here  For similar symptom in addition to suicide attempt by overdose.   Discharged on Lamictal and Trazodone. Said Treatment does not accept his  insurance.  Is the patient at risk to self? Yes.    Has the patient been a risk to self in the past 6 months? Yes.    Has the patient been a risk to self within the distant past? Yes.    Is the patient a risk to others? No.  Has the patient been a risk to others in the past 6 months? No.  Has the patient been a risk to others within the distant past? No.   Prior Inpatient Therapy:   Prior Outpatient Therapy:    Alcohol Screening: 1. How often do you have a drink containing alcohol?: 2 to 4 times a month 2. How many drinks containing alcohol do you have on a typical day when you are drinking?: 5 or 6 4. How often during the last year have you found that you were not able to stop drinking once you had started?: Never 5. How often during the last year have you failed to do what was normally expected from you becasue of drinking?: Never 6. How often during the last year have you needed a first drink in the morning to get yourself going after a heavy drinking session?: Never 7. How often during the last year have you had a feeling of guilt of remorse after drinking?: Never 8. How often during the last year have you been unable to remember what happened the night before because you had been drinking?: Never 9. Have you or someone else been injured as a result of your drinking?: No 10. Has a relative or friend or a doctor or another health worker been concerned about your drinking or suggested you cut down?: Yes, during the last year Alcohol Use Disorder Identification Test Final Score (AUDIT): 8 Brief Intervention: Yes Substance Abuse History in the last 12 months:  Yes.   Consequences of Substance Abuse: Negative Previous Psychotropic Medications: Yes  Psychological Evaluations: No  Past Medical History:  Past Medical History:  Diagnosis Date  . ADHD (attention deficit hyperactivity disorder)   . Anxiety   . Asthma   . Depression   . Second degree [place] I AV block May 2015   seen by  Dr. Rayann Heman - no PPM indicated at the time; AV block due to increased vagal tone     Past Surgical History:  Procedure Laterality Date  . NO PAST SURGERIES     Family History: History reviewed. No pertinent family history. Family Psychiatric  History: None Tobacco Screening:   Social History:  History  Alcohol Use  . Yes    Comment: occasional      History  Drug Use  . Frequency: 7.0 times per week  . Types: Marijuana    Additional Social History:                           Allergies:  No Known Allergies Lab Results:  Results for orders placed or performed during the hospital encounter of 09/10/15 (from the past 48 hour(s))  Rapid urine drug screen (hospital performed)     Status: Abnormal   Collection Time: 09/10/15 12:34 PM  Result Value Ref Range   Opiates NONE DETECTED NONE DETECTED   Cocaine NONE DETECTED NONE DETECTED   Benzodiazepines NONE DETECTED NONE DETECTED   Amphetamines NONE DETECTED NONE DETECTED   Tetrahydrocannabinol POSITIVE (A) NONE DETECTED   Barbiturates NONE DETECTED NONE DETECTED    Comment:        DRUG SCREEN FOR MEDICAL PURPOSES ONLY.  IF CONFIRMATION IS NEEDED FOR ANY PURPOSE, NOTIFY LAB WITHIN 5 DAYS.        LOWEST DETECTABLE LIMITS FOR URINE DRUG SCREEN Drug Class       Cutoff (ng/mL) Amphetamine      1000 Barbiturate      200 Benzodiazepine   734 Tricyclics       193 Opiates          300 Cocaine          300 THC              50   Comprehensive metabolic panel     Status: None   Collection Time: 09/10/15 12:48 PM  Result Value Ref Range   Sodium 139 135 - 145 mmol/L   Potassium 3.9 3.5 - 5.1 mmol/L   Chloride 108 101 - 111 mmol/L   CO2 24 22 - 32 mmol/L   Glucose, Bld 95 65 - 99 mg/dL   BUN 8 6 - 20 mg/dL   Creatinine, Ser 1.16 0.61 - 1.24 mg/dL   Calcium 9.8 8.9 - 10.3 mg/dL   Total Protein 7.7 6.5 - 8.1 g/dL   Albumin 4.6 3.5 - 5.0 g/dL   AST 28 15 - 41 U/L   ALT 42 17 - 63 U/L   Alkaline Phosphatase 80 38 - 126  U/L   Total Bilirubin 0.6 0.3 - 1.2 mg/dL   GFR calc non Af Amer >60 >60 mL/min   GFR calc Af Amer >60 >60 mL/min    Comment: (NOTE) The eGFR has been calculated using the CKD EPI equation. This calculation has not been validated in all clinical situations. eGFR's persistently <60 mL/min signify possible Chronic Kidney Disease.    Anion gap 7 5 - 15  Ethanol  Status: None   Collection Time: 09/10/15 12:48 PM  Result Value Ref Range   Alcohol, Ethyl (B) <5 <5 mg/dL    Comment:        LOWEST DETECTABLE LIMIT FOR SERUM ALCOHOL IS 5 mg/dL FOR MEDICAL PURPOSES ONLY   Salicylate level     Status: None   Collection Time: 09/10/15 12:48 PM  Result Value Ref Range   Salicylate Lvl <8.8 2.8 - 30.0 mg/dL  Acetaminophen level     Status: Abnormal   Collection Time: 09/10/15 12:48 PM  Result Value Ref Range   Acetaminophen (Tylenol), Serum <10 (L) 10 - 30 ug/mL    Comment:        THERAPEUTIC CONCENTRATIONS VARY SIGNIFICANTLY. A RANGE OF 10-30 ug/mL MAY BE AN EFFECTIVE CONCENTRATION FOR MANY PATIENTS. HOWEVER, SOME ARE BEST TREATED AT CONCENTRATIONS OUTSIDE THIS RANGE. ACETAMINOPHEN CONCENTRATIONS >150 ug/mL AT 4 HOURS AFTER INGESTION AND >50 ug/mL AT 12 HOURS AFTER INGESTION ARE OFTEN ASSOCIATED WITH TOXIC REACTIONS.   cbc     Status: None   Collection Time: 09/10/15 12:48 PM  Result Value Ref Range   WBC 6.0 4.0 - 10.5 K/uL   RBC 5.75 4.22 - 5.81 MIL/uL   Hemoglobin 16.2 13.0 - 17.0 g/dL   HCT 48.1 39.0 - 52.0 %   MCV 83.7 78.0 - 100.0 fL   MCH 28.2 26.0 - 34.0 pg   MCHC 33.7 30.0 - 36.0 g/dL   RDW 14.3 11.5 - 15.5 %   Platelets 161 150 - 400 K/uL    Blood Alcohol level:  Lab Results  Component Value Date   ETH <5 09/10/2015   ETH <5 50/27/7412    Metabolic Disorder Labs:  No results found for: HGBA1C, MPG No results found for: PROLACTIN No results found for: CHOL, TRIG, HDL, CHOLHDL, VLDL, LDLCALC  Current Medications: Current Facility-Administered  Medications  Medication Dose Route Frequency Provider Last Rate Last Dose  . acetaminophen (TYLENOL) tablet 650 mg  650 mg Oral Q4H PRN Lurena Nida, NP      . alum & mag hydroxide-simeth (MAALOX/MYLANTA) 200-200-20 MG/5ML suspension 30 mL  30 mL Oral PRN Lurena Nida, NP      . ibuprofen (ADVIL,MOTRIN) tablet 600 mg  600 mg Oral Q8H PRN Lurena Nida, NP      . lamoTRIgine (LAMICTAL) tablet 25 mg  25 mg Oral Daily Lurena Nida, NP   25 mg at 09/11/15 0754  . traZODone (DESYREL) tablet 50 mg  50 mg Oral QHS PRN,MR X 1 Lurena Nida, NP   50 mg at 09/10/15 2331   PTA Medications: Prescriptions Prior to Admission  Medication Sig Dispense Refill Last Dose  . lamoTRIgine (LAMICTAL) 25 MG tablet Take 1 tablet (25 mg total) by mouth daily. For mood stabilization 30 tablet 0 09/10/2015 at Unknown time  . traZODone (DESYREL) 50 MG tablet Take 1 tablet (50 mg total) by mouth at bedtime as needed and may repeat dose one time if needed for sleep. 60 tablet 0 Past Month at Unknown time    Musculoskeletal: Strength & Muscle Tone: within normal limits Gait & Station: normal Patient leans: N/A  Psychiatric Specialty Exam: Physical Exam  ROS  Blood pressure 133/68, pulse 80, temperature 98.5 F (36.9 C), temperature source Oral, resp. rate 16, height 5' 9.25" (1.759 m), weight 122.9 kg (271 lb).Body mass index is 39.73 kg/m.  General Appearance: Casual  Eye Contact:  Good  Speech:  Clear and Coherent and Normal  Rate  Volume:  Normal  Mood:  Depressed  Affect:  Congruent  Thought Process:  Coherent and Goal Directed  Orientation:  Full (Time, Place, and Person)  Thought Content:  Logical  Suicidal Thoughts:  Yes.  without intent/plan  Homicidal Thoughts:  No  Memory:  Immediate;   Fair Recent;   Fair Remote;   Fair  Judgement:  Fair  Insight:  Fair  Psychomotor Activity:  Increased  Concentration:  Attention Span: Fair  Recall:  [employer] of Knowledge:  Fair  Language:  Fair   Akathisia:  No  Handed:  Right  AIMS (if indicated):     Assets:  Communication Skills Desire for Improvement Financial Resources/Insurance Housing Intimacy Leisure Time Physical Health Social Support Talents/Skills Transportation Vocational/Educational  ADL's:  Intact  Cognition:  WNL  Sleep:  Number of Hours: 6       Treatment Plan Summary: Daily contact with patient to assess and evaluate symptoms and progress in treatment, Medication management and Plan to restart lamictal and trazodone and add celexa  Observation Level/Precautions:  15 minute checks  Laboratory:    Psychotherapy:    Medications:    Consultations:    Discharge Concerns:    Estimated LOS:  Other:     I certify that inpatient services furnished can reasonably be expected to improve the patient's condition.    Ruffin Frederick, MD 8/31/201711:25 AM

## 2015-09-11 NOTE — Tx Team (Signed)
Interdisciplinary Treatment and Diagnostic Plan Update  09/11/2015 Time of Session: 8:08 AM  Warren Mcbride MRN: [id number]  Principal Diagnosis: <principal problem not specified>  Secondary Diagnoses: Active Problems:   MDD (major depressive disorder), recurrent severe, without psychosis ([place])   Current Medications:  Current Facility-Administered Medications  Medication Dose Route Frequency Provider Last Rate Last Dose  . acetaminophen (TYLENOL) tablet 650 mg  650 mg Oral Q4H PRN Lurena Nida, NP      . alum & mag hydroxide-simeth (MAALOX/MYLANTA) 200-200-20 MG/5ML suspension 30 mL  30 mL Oral PRN Lurena Nida, NP      . citalopram (CELEXA) tablet 20 mg  20 mg Oral Daily Sueanne Margarita, MD   20 mg at 09/11/15 1158  . ibuprofen (ADVIL,MOTRIN) tablet 600 mg  600 mg Oral Q8H PRN Lurena Nida, NP      . lamoTRIgine (LAMICTAL) tablet 25 mg  25 mg Oral Daily Lurena Nida, NP   25 mg at 09/11/15 0754  . traZODone (DESYREL) tablet 50 mg  50 mg Oral QHS PRN,MR X 1 Lurena Nida, NP   50 mg at 09/10/15 2331    PTA Medications: Prescriptions Prior to Admission  Medication Sig Dispense Refill Last Dose  . lamoTRIgine (LAMICTAL) 25 MG tablet Take 1 tablet (25 mg total) by mouth daily. For mood stabilization 30 tablet 0 09/10/2015 at Unknown time  . traZODone (DESYREL) 50 MG tablet Take 1 tablet (50 mg total) by mouth at bedtime as needed and may repeat dose one time if needed for sleep. 60 tablet 0 Past Month at Unknown time    Treatment Modalities: Medication Management, Group therapy, Case management,  1 to 1 session with clinician, Psychoeducation, Recreational therapy.   Physician Treatment Plan for Primary Diagnosis: <principal problem not specified> Long Term Goal(s): Improvement in symptoms so as ready for discharge  Short Term Goals: Ability to identify changes in lifestyle to reduce recurrence of condition will improve, Ability to identify and develop effective coping behaviors  will improve and Ability to identify triggers associated with substance abuse/mental health issues will improve  Medication Management: Evaluate patient's response, side effects, and tolerance of medication regimen.  Therapeutic Interventions: 1 to 1 sessions, Unit Group sessions and Medication administration.  Evaluation of Outcomes: Not Met  Physician Treatment Plan for Secondary Diagnosis: Active Problems:   MDD (major depressive disorder), recurrent severe, without psychosis ([place])   Long Term Goal(s): Improvement in symptoms so as ready for discharge  Short Term Goals: Ability to identify changes in lifestyle to reduce recurrence of condition will improve, Ability to identify and develop effective coping behaviors will improve and Ability to identify triggers associated with substance abuse/mental health issues will improve  Medication Management: Evaluate patient's response, side effects, and tolerance of medication regimen.  Therapeutic Interventions: 1 to 1 sessions, Unit Group sessions and Medication administration.  Evaluation of Outcomes: Not Met   RN Treatment Plan for Primary Diagnosis: <principal problem not specified> Long Term Goal(s): Knowledge of disease and therapeutic regimen to maintain health will improve  Short Term Goals: Ability to participate in decision making will improve, Ability to verbalize feelings will improve and Ability to identify and develop effective coping behaviors will improve  Medication Management: RN will administer medications as ordered by provider, will assess and evaluate patient's response and provide education to patient for prescribed medication. RN will report any adverse and/or side effects to prescribing provider.  Therapeutic Interventions: 1 on 1 counseling sessions, Psychoeducation, Medication  administration, Evaluate responses to treatment, Monitor vital signs and CBGs as ordered, Perform/monitor CIWA, COWS, AIMS and Fall Risk  screenings as ordered, Perform wound care treatments as ordered.  Evaluation of Outcomes: Not Met   LCSW Treatment Plan for Primary Diagnosis: <principal problem not specified> Long Term Goal(s): Safe transition to appropriate next level of care at discharge, Engage patient in therapeutic group addressing interpersonal concerns.  Short Term Goals: Engage patient in aftercare planning with referrals and resources, Facilitate patient progression through stages of change regarding substance use diagnoses and concerns, Identify triggers associated with mental health/substance abuse issues and Increase skills for wellness and recovery  Therapeutic Interventions: Assess for all discharge needs, 1 to 1 time with Social worker, Explore available resources and support systems, Assess for adequacy in community support network, Educate family and significant other(s) on suicide prevention, Complete Psychosocial Assessment, Interpersonal group therapy.  Evaluation of Outcomes: Not Met   Progress in Treatment: Attending groups: Pt is new to milieu, continuing to assess  Participating in groups: Pt is new to milieu, continuing to assess  Taking medication as prescribed: Yes, MD continues to assess for medication changes as needed Toleration medication: Yes, no side effects reported at this time Family/Significant other contact made: No, CSW assessing for appropriate contact Patient understands diagnosis: Yes AEB seeking help with depression and substance use Discussing patient identified problems/goals with staff: Yes Medical problems stabilized or resolved: Yes Denies suicidal/homicidal ideation: No, endorses passive SI on admission; will contract for safety on the unit Issues/concerns per patient self-inventory: None Other: N/A  New problem(s) identified: None identified at this time.   New Short Term/Long Term Goal(s): None identified at this time.   Discharge Plan or Barriers: CSW will assess  for appropriate discharge plan and relevant barriers.   Reason for Continuation of Hospitalization: Depression Medication stabilization Suicidal ideation Withdrawal symptoms  Estimated Length of Stay: 2-3 days  Attendees: Patient: 09/11/2015  8:08 AM  Physician: Dr. Ananias Pilgrim 09/11/2015  8:08 AM  Nursing: Darrol Angel, RN; Gaylan Gerold, RN 09/11/2015  8:08 AM  RN Care Manager: Lars Pinks, RN 09/11/2015  8:08 AM  Social Worker: Peri Maris, LCSW; Kristin Drinkard, LCSW 09/11/2015  8:08 AM  Recreational Therapist:  09/11/2015  8:08 AM  Other: Lindell Spar, NP; Samuel Jester, NP 09/11/2015  8:08 AM  Other:  09/11/2015  8:08 AM  Other: 09/11/2015  8:08 AM    Scribe for Treatment Team: Bo Mcclintock, LCSW 09/11/2015 8:08 AM

## 2015-09-11 NOTE — BHH Counselor (Signed)
Adult Comprehensive Assessment  Patient ID: Warren Mcbride, male   DOB: Jun 30, 1995, 20 y.o.   MRN: [id number]  Information Source: Information source: Patient  Current Stressors:  Educational / Learning stressors: Denies stressors Employment / Job issues: Denies stressors Family Relationships: None reported Museum/gallery curator / Lack of resources (include bankruptcy): Denies stressors Housing / Lack of housing: Denies stressors Physical health (include injuries & life threatening diseases): Denies stressors Social relationships: Denies stressors Substance abuse: Denies stressors Bereavement / Loss: Denies stressors  Living/Environment/Situation:  Living Arrangements: Spouse/significant other, Parent, Other relatives (Mother, girlfriend) Living conditions (as described by patient or guardian): Good, safe How long has patient lived in current situation?: Girlfriend has been there 2 years.  Has always lived with mother. What is atmosphere in current home: Loving, Other (Comment) (Arguments at times)  Family History:  Marital status: Long term relationship  (the person with whom he was in a relationship met with assessment CSW in the hall and reported that they are still friends, but no longer dating) Long term relationship, how long?: 2 years What types of issues is patient dealing with in the relationship?: None Are you sexually active?: Yes What is your sexual orientation?: Straight Has your sexual activity been affected by drugs, alcohol, medication, or emotional stress?: None Does patient have children?: No  Childhood History:  By whom was/is the patient raised?: Both parents Additional childhood history information: Father lived in the home until pt was 89yo. Description of patient's relationship with caregiver when they were a child: Did not really have a relationship with father.  Had a good relationship with mother. Patient's description of current relationship with people who  raised him/her: Is working on relationship with both mother and father now.  Arguments sometimes, grew distant as he got older. How were you disciplined when you got in trouble as a child/adolescent?: "Whooped" Does patient have siblings?: Yes Number of Siblings: 2 Description of patient's current relationship with siblings: Has 2 brothers, has a great relationship with both. Did patient suffer any verbal/emotional/physical/sexual abuse as a child?: No Did patient suffer from severe childhood neglect?: No Has patient ever been sexually abused/assaulted/raped as an adolescent or adult?: No Was the patient ever a victim of a crime or a disaster?: No Witnessed domestic violence?: No Has patient been effected by domestic violence as an adult?: No  Education:  Highest grade of school patient has completed: Is in freshman year of college now Currently a student?: Yes If yes, how has current illness impacted academic performance: Has some late assignments due to depression. Name of school: [employer] person: Pt himself How long has the patient attended?: 1 year Learning disability?: Yes What learning problems does patient have?: ADHD  Employment/Work Situation:   Employment situation: Employed Where is patient currently employed?: [employer] long has patient been employed?: 5 months Patient's job has been impacted by current illness: No What is the longest time patient has a held a job?: 2 years Where was the patient employed at that time?: [employer] Has patient ever been in the [employer]?: No Are There Guns or Other Weapons in [place]?: No  Financial Resources:   Financial resources: Income from employment, Private insurance Does patient have a representative payee or guardian?: No  Alcohol/Substance Abuse:   What has been your use of drugs/alcohol within the last 12 months?: THC use If attempted suicide, did drugs/alcohol play a role in this?: Yes (Used Xanax to try  to end his life) Alcohol/Substance Abuse Treatment  Hx: Substance abuse evaluation, Past Tx, Outpatient If yes, describe treatment: [facility] treatment  Has alcohol/substance abuse ever caused legal problems?: No  Social Support System:   Patient's Community Support System: Good Describe Community Support System: mother, brothers, girlfriend, father Type of faith/religion: Christianity How does patient's faith help to cope with current illness?: Goes to church, "it helps me a lot."  Leisure/Recreation:   Leisure and Hobbies: Plays with dog  Strengths/Needs:   What things does the patient do well?: Good at a lot of things, reading, writing In what areas does patient struggle / problems for patient: Being able to express himself like he is "supposed to," has problems with bottling up emotions.  Off medicaitions since December 2016 when his Medicaid ran out.   Discharge Plan:   Does patient have access to transportation?: Yes Will patient be returning to same living situation after discharge?: Yes Currently receiving community mental health services: No If no, would patient like referral for services when discharged?: Yes (What county?) (would like referral to [place]) Does patient have financial barriers related to discharge medications?: No  Summary/Recommendations:   Patient is a 20 year old malewith a diagnosis of Major Depressive Disorder, ADHD by history, and Unspecified Anxiety disorder. Pt presented to the hospital with suicidal thoughts and increased mood instability. Pt reports primary trigger(s) for admission was medication noncompliance. Patient will benefit from crisis stabilization, medication evaluation, group therapy and psycho education in addition to case management for discharge planning. At discharge it is recommended that Pt remain compliant with established discharge plan and continued treatment.  Warren Maris, LCSW Clinical Social Work [phone]

## 2015-09-11 NOTE — Progress Notes (Signed)
Admission Note:   [age] yr male who presents Vol in no acute distress for the treatment of SI and Depression. Pt appears flat and depressed. Pt was calm and cooperative with admission process. Pt endorses SI and contracts for safety upon admission. Pt denies AVH. Pt has past medical Hx of mild heart block without complication. Patient contacted mobile crises today due to suicidal thoughts. He was given the contact information by his probation officer. [employer] showed up to his [facility]. Patient states that he has felt increasingly depressed and suicidal over the past 2 weeks. He identifies his medications not working as the trigger for his depression. Skin was assessed and found to be clear of any abnormal marks apart from tattoos. PT searched and no contraband found, POC and unit policies explained and understanding verbalized. Consents obtained. Snacks and fluids offered, and fluids accepted. Pt had no additional questions or concerns.

## 2015-09-11 NOTE — Progress Notes (Signed)
Patient ID: Warren Mcbride, male   DOB: [birth year], 20 y.o.   MRN: [id number]  DAR: Pt. Denies SI/HI and A/V Hallucinations. He reports sleep is good, appetite is good, energy level is normal, and concentration is good. He rates depression 0/10, hopelessness 0/10, and anxiety 0/10. Patient does not report any pain or discomfort at this time. Support and encouragement provided to the patient to come to Clinical research associatewriter. Writer would check in with patient intermittently and patient would say, "I'm okay." Scheduled medications administered to patient per physician's orders. Patient is minimal but cooperative. He is seen in the milieu and is seen in the dayroom but mostly stays on the phone when in the milieu. Q15 minute checks are maintained for safety.

## 2015-09-11 NOTE — Progress Notes (Signed)
Patient ID: Warren Mcbride, male   DOB: 06-27-1995, 20 y.o.   MRN: [id number] D: Patient reported to [facility] about chest pain. Pt reported left chest stabbing pain rated it as 8 on a 0-10 scale. Vital signs were WNL. Pt denies anxiety or eating spicy food. PA on call notified. New orders of 750 mg robaxin and 600 mg ibuprofen given. Pt given heat pack and encourage to rest. Pt reports need to talk to girlfriend before heading to bed. Pt asleep upon reassessment. Pt did not respond to name. Respiration even and regular. Will continue to monitor pt.

## 2015-09-12 MED ORDER — CITALOPRAM HYDROBROMIDE 20 MG PO TABS: 20.0000 mg | ORAL_TABLET | Freq: Every day | ORAL | 0 refills | Status: DC

## 2015-09-12 MED ORDER — LAMOTRIGINE 25 MG PO TABS: 25.0000 mg | ORAL_TABLET | Freq: Every day | ORAL | 0 refills | Status: DC

## 2015-09-12 MED ORDER — TRAZODONE HCL 50 MG PO TABS: 50.0000 mg | ORAL_TABLET | Freq: Every evening | ORAL | 0 refills | Status: DC | PRN

## 2015-09-12 NOTE — [facility] Suicide Risk Assessment (Signed)
[facility] INPATIENT:  Family/Significant Other Suicide Prevention Education  Suicide Prevention Education:  Education Completed;Warren Mcbride (mother) has been identified by the patient as the family member/significant other with whom the patient will be residing, and identified as the person(s) who will aid the patient in the event of a mental health crisis (suicidal ideations/suicide attempt).  With written consent from the patient, the family member/significant other has been provided the following suicide prevention education, prior to the and/or following the discharge of the patient.  The suicide prevention education provided includes the following:  Suicide risk factors  Suicide prevention and interventions  National Suicide Hotline telephone number  [facility] assessment telephone number  [facility] Emergency Assistance 911  [facility] and/or Residential Mobile Crisis Unit telephone number  Request made of family/significant other to:  Remove weapons (e.g., guns, rifles, knives), all items previously/currently identified as safety concern.    Remove drugs/medications (over-the-counter, prescriptions, illicit drugs), all items previously/currently identified as a safety concern.  The family member/significant other verbalizes understanding of the suicide prevention education information provided.  The family member/significant other agrees to remove the items of safety concern listed above.  Warren Mcbride MSW, LCSWA  09/12/2015, 10:45 AM

## 2015-09-12 NOTE — Discharge Summary (Signed)
Physician Discharge Summary Note  Patient:  Warren Mcbride is an 20 y.o., male MRN:  [id number] DOB:  Jan 07, 1996 Patient phone:  [phone] (home)  Patient address:   [address] [place] [address],  Total Time spent with patient: Greater than 30 minutes  Date of Admission:  09/10/2015  Date of Discharge: 09-12-15  Reason for Admission: Suicidal ideations.   Principal Problem: Major depressive disorder, recurrent episodes,   Discharge Diagnoses: Patient Active Problem List   Diagnosis Date Noted  . MDD (major depressive disorder), recurrent severe, without psychosis (HCC) [F33.2] 09/10/2015  . Depression [F32.9] 05/10/2015  . Bipolar I disorder, most recent episode depressed (HCC) [F31.30] 05/08/2015  . Polysubstance dependence including opioid type drug, episodic abuse (HCC) [F11.20, F19.20] 05/08/2015  . Overdose of benzodiazepine [T42.4X1A]   . Sinus pause [I45.5]   . Overdose [T50.901A] 05/07/2015  . Tachypnea [R06.82] 05/07/2015  . History of asthma [Z87.09] 05/07/2015  . Blurred vision [H53.8] 05/07/2015  . 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 Syncope [R55] 05/18/2013   Past Psychiatric History: Bipolar disorder  Past Medical History:  Past Medical History:  Diagnosis Date  . ADHD (attention deficit hyperactivity disorder)   . Anxiety   . Asthma   . Depression   . 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 Johney Frame - no PPM indicated at the time; AV block due to increased vagal tone     Past Surgical History:  Procedure Laterality Date  . NO PAST SURGERIES     Family History: History reviewed. No pertinent family history.  Family Psychiatric  History: See H&P  Social History:  History  Alcohol Use  . Yes    Comment: occasional      History  Drug Use  . Frequency: 7.0 times per week  . Types: Marijuana    Social History   Social History  . Marital status: Single    Spouse name: N/A  . Number of children: N/A   . Years of education: N/A   Social History Main Topics  . Smoking status: Current Every Day Smoker    Packs/day: 0.50    Years: 4.00    Types: Cigarettes  . Smokeless tobacco: Never Used  . Alcohol use Yes     Comment: occasional   . Drug use:     Frequency: 7.0 times per week    Types: Marijuana  . Sexual activity: Yes   Other Topics Concern  . None   Social History Narrative   Lives in [place] with  Mother.  Recently employed at [employer].   Hospital Course: 20 year old male  With prior history of Depression admitted voluntarily because of one week of depressive symptoms including suicidal ideation without a plan He called the crisis line who advised him to call 911 who brought him to [facility] ED.  Adelaido's stay in this hospital was rather very brief. He came in to the hospital complaining of suicidal ideations without plans due to worsening symptoms of depression of one week duration. He was admitted for mood stabilization treatments. He was started on Citalopram 20 mg for depression, Lamictal 25 mg for mood stabilization & Trazodone 50 mg for insomnia. He was enrolled in the group counseling sessions being offered & held on this unit. He learned coping skills. Within 24 hours of his admission to the unit, Aysen reported improved mood & symptoms. He was provided with a therapeutic and supportive environment  within his short hospital stay. He was also motivated for recovery as evidenced by a positive/appropriate behavior, his interaction with the staff & fellow patients.He has asked to be discharged to his home to continue further psychiatric treatment on an outpatient basis as noted below.  Upon his hospital discharge, Geryl RankinsDashawn was in much improved condition than upon admission.His symptoms were reported as significantly decreased or resolved completely. He adamantly denies any SI/HI,  AVH, delusional thoughts & or paranoia. He was motivated to continue taking medication with  a goal of continued improvement in mental health. He will continue psychiatric care on an outpatient basis as noted below. He is provided with all the necessary information required to make this appointment without problems.  [facility] in no apparent distress. Transportation per mother.  Physical Findings: AIMS:  , ,  ,  ,    CIWA:    COWS:     Musculoskeletal: Strength & Muscle Tone: within normal limits Gait & Station: normal Patient leans: N/A  Psychiatric Specialty Exam: Review of Systems  Constitutional: Negative.   HENT: Negative.   Eyes: Negative.   Respiratory: Negative.   Cardiovascular: Negative.   Gastrointestinal: Negative.   Genitourinary: Negative.   Musculoskeletal: Negative.   Skin: Negative.   Neurological: Negative.   Endo/Heme/Allergies: Negative.   Psychiatric/Behavioral: Positive for depression (Stable) and substance abuse (Hx. Polysusbtsance dependence). Negative for hallucinations, memory loss and suicidal ideas. The patient has insomnia (Stable). The patient is not nervous/anxious.     Blood pressure 113/67, pulse 86, temperature 98.2 F (36.8 C), temperature source Oral, resp. rate 18, height 5' 9.25" (1.759 m), weight 122.9 kg (271 lb), SpO2 100 %.Body mass index is 39.73 kg/m.  See Md's SRA   Has this patient used any form of tobacco in the last 30 days? (Cigarettes, Smokeless Tobacco, Cigars, and/or Pipes): No  Blood Alcohol level:  Lab Results  Component Value Date   ETH <5 09/10/2015   ETH <5 05/06/2015   Metabolic Disorder Labs:  No results found for: HGBA1C, MPG No results found for: PROLACTIN No results found for: CHOL, TRIG, HDL, CHOLHDL, VLDL, LDLCALC  See Psychiatric Specialty Exam and Suicide Risk Assessment completed by Attending Physician prior to discharge.  Discharge destination:  Home  Is patient on multiple antipsychotic therapies at discharge:  No   Has Patient had three or more failed trials  of antipsychotic monotherapy by history:  No  Recommended Plan for Multiple Antipsychotic Therapies: NA    Medication List    TAKE these medications     Indication  citalopram 20 MG tablet Commonly known as:  CELEXA Take 1 tablet (20 mg total) by mouth daily. For depression  Indication:  Depression   lamoTRIgine 25 MG tablet Commonly known as:  LAMICTAL Take 1 tablet (25 mg total) by mouth daily. For mood stabilization  Indication:  Mood stabilization   traZODone 50 MG tablet Commonly known as:  DESYREL Take 1 tablet (50 mg total) by mouth at bedtime as needed and may repeat dose one time if needed for sleep.  Indication:  Trouble Sleeping      Follow-up Information    [facility] up on 09/17/2015.   Why:  at 3:00pm with Mr. [facility] for your initial assessment for services. Contact information:  [address] [place], [place] [address]  [phone]         Follow-up recommendations: Activity:  As tolerated Diet: As recommended by your primary care  doctor. Keep all scheduled follow-up appointments as recommended.  Comments: Take all your medications as prescribed by your mental healthcare provider. Report any adverse effects and or reactions from your medicines to your outpatient provider promptly. Patient is instructed and cautioned to not engage in alcohol and or illegal drug use while on prescription medicines. In the event of worsening symptoms, patient is instructed to call the crisis hotline, 911 and or go to the nearest ED for appropriate evaluation and treatment of symptoms. Follow-up with your primary care provider for your other medical issues, concerns and or health care needs.   Signed: Sanjuana Kava, NP, PMHNP, FNP-BC 09/12/2015, 11:25 AM  .

## 2015-09-12 NOTE — Progress Notes (Signed)
Patient was discharged per order. AVS, medications, scripts, transition summary, and suicide safety plan were all reviewed with patient. Pt was given an opportunity to ask questions and verbalized understanding of all discharge paperwork. Belongings were returned, and patient signed for receipt. Patient verbalized readiness for discharge and appeared in no acute distress when escorted to lobby.  

## 2015-09-12 NOTE — Progress Notes (Signed)
Recreation Therapy Notes  Date:09/12/15 Time:0930 Location: 300 Hall Dayroom  Group Topic: Stress Management  Goal Area(s) Addresses:  Patient will verbalize importance of using healthy stress management.  Patient will identify positive emotions associated with healthy stress management.   Intervention: Stress Management  Activity: Guided Training and development officermagery Script. LRT introduced the stress management technique guided imagery. LRT read script that guided patients in how to perform the technique. Patients were to follow along as LRT read the script to engage in the technique.  Education:Stress Management, Discharge Planning.   Education Outcome:Needs additional education  Clinical Observations/Feedback:Pt did not attend group.   Caroll RancherMarjette Larine Fielding, LRT/CTRS         Lillia AbedLindsay, Abbe Bula A 09/12/2015 1:00 PM

## 2015-09-12 NOTE — Progress Notes (Signed)
  [facility] :  Will you be returning to the same living situation after discharge:  Yes,  home  At discharge, do you have transportation home?: Yes,  mother  Do you have the ability to pay for your medications: Yes,  insurance  Release of information consent forms completed and in the chart;  Patient's signature needed at discharge.  Patient to Follow up at: Follow-up Information    [facility] Follow up on 09/17/2015.   Why:  at 3:00pm with Mr. [facility] for your initial assessment for services. Contact information:  [address] [place], [place] [address]  [phone]          Next level of care provider has access to [facility] Link:no  Safety Planning and Suicide Prevention discussed: Yes,  with patient and mother      Has patient been referred to the Quitline?: Patient refused referral  Patient has been referred for addiction treatment: Yes  Rondall Allegraandace L Danicia Terhaar MSW, LCSWA  09/12/2015, 10:49 AM

## 2015-09-12 NOTE — Progress Notes (Signed)
Patient ID: Warren Mcbride, male   DOB: Jul 20, 1995, 20 y.o.   MRN: [id number] Pt denies SI/HI/AVH/pain. He reported good sleep, appetite, normal energy level, and good concentration. He rated his depression, hopelessness, and anxiety all zero. He reports readiness for discharge.   A: Meds given as ordered. Q15 safety checks maintained. Support/encouragement offered.  R: Pt remains free from harm and continues with treatment. Will continue to monitor for needs/safety.

## 2015-09-12 NOTE — [facility] Suicide Risk Assessment (Signed)
[facility]   Principal Problem: <principal problem not specified> Discharge Diagnoses:  Patient Active Problem List   Diagnosis Date Noted  . MDD (major depressive disorder), recurrent severe, without psychosis (HCC) [F33.2] 09/10/2015  . Depression [F32.9] 05/10/2015  . Bipolar I disorder, most recent episode depressed (HCC) [F31.30] 05/08/2015  . Polysubstance dependence including opioid type drug, episodic abuse (HCC) [F11.20, F19.20] 05/08/2015  . Overdose of benzodiazepine [T42.4X1A]   . Sinus pause [I45.5]   . Overdose [T50.901A] 05/07/2015  . Tachypnea [R06.82] 05/07/2015  . History of asthma [Z87.09] 05/07/2015  . Blurred vision [H53.8] 05/07/2015  . 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 Syncope [R55] 05/18/2013    Total Time spent with patient: 30 minutes  Musculoskeletal: Strength & Muscle Tone: within normal limits Gait & Station: normal Patient leans: N/A  Psychiatric Specialty Exam: ROS  Blood pressure 120/71, pulse 69, temperature 98.1 F (36.7 C), temperature source Oral, resp. rate 20, height 5' 9.25" (1.759 m), weight 122.9 kg (271 lb), SpO2 100 %.Body mass index is 39.73 kg/m.  General Appearance: Casual  Eye Contact::  Good  Speech:  Normal Rate409  Volume:  Normal  Mood:  Euthymic  Affect:  Appropriate and Congruent  Thought Process:  Coherent and Goal Directed  Orientation:  Full (Time, Place, and Person)  Thought Content:  Logical  Suicidal Thoughts:  No  Homicidal Thoughts:  No  Memory:  Immediate;   Good Recent;   Good Remote;   Good  Judgement:  Fair  Insight:  Fair  Psychomotor Activity:  Normal  Concentration:  Good  Recall:  Good  Fund of Knowledge:Good  Language: Good  Akathisia:  No  Handed:  Right  AIMS (if indicated):     Assets:  Communication Skills Desire for Improvement Financial Resources/Insurance Housing Intimacy Physical Health Resilience Social  Support Talents/Skills Transportation Vocational/Educational  Sleep:  Number of Hours: 6  Cognition: WNL  ADL's:  Intact   Mental Status Per Nursing Assessment::   On Admission:     Demographic Factors:  Male and Adolescent or young adult  Loss Factors: NA  Historical Factors: Prior suicide attempts and Family history of mental illness or substance abuse  Risk Reduction Factors:   Sense of responsibility to family, Religious beliefs about death, Employed, Living with another person, especially a relative, Positive social support, Positive therapeutic relationship and Positive coping skills or problem solving skills  Continued Clinical Symptoms:  None  Cognitive Features That Contribute To Risk:  None    Suicide Risk:  Minimal: No identifiable suicidal ideation.  Patients presenting with no risk factors but with morbid ruminations; may be classified as minimal risk based on the severity of the depressive symptoms  Follow-up Information    [facility] up on 09/17/2015.   Why:  at 3:00pm with Mr. [facility] for your initial assessment for services. Contact information:  [address] [place], [place] [address]  [phone]          Plan Of Care/Follow-up recommendations:  Activity:  Follow with outpatient provider and continue with prescribed medications  Wynelle BourgeoisUreh N Lekauwa, MD 09/12/2015, 6:57 AM

## 2015-09-18 ENCOUNTER — Encounter: Payer: Self-pay | Admitting: Medical

## 2015-10-05 ENCOUNTER — Encounter (HOSPITAL_COMMUNITY): Payer: Self-pay | Admitting: Nurse Practitioner

## 2015-10-05 ENCOUNTER — Emergency Department (HOSPITAL_COMMUNITY): Payer: 59

## 2015-10-05 ENCOUNTER — Emergency Department (HOSPITAL_COMMUNITY)
Admission: EM | Admit: 2015-10-05 | Discharge: 2015-10-05 | Disposition: A | Discharge status: Home / Self Care | Payer: 59 | Attending: Emergency Medicine | Admitting: Emergency Medicine

## 2015-10-05 DIAGNOSIS — F909 Attention-deficit hyperactivity disorder, unspecified type: Secondary | ICD-10-CM | POA: Insufficient documentation

## 2015-10-05 DIAGNOSIS — R079 Chest pain, unspecified: Secondary | ICD-10-CM | POA: Diagnosis present

## 2015-10-05 DIAGNOSIS — R0602 Shortness of breath: Secondary | ICD-10-CM | POA: Diagnosis not present

## 2015-10-05 DIAGNOSIS — F17210 Nicotine dependence, cigarettes, uncomplicated: Secondary | ICD-10-CM | POA: Insufficient documentation

## 2015-10-05 DIAGNOSIS — R0789 Other chest pain: Secondary | ICD-10-CM | POA: Diagnosis not present

## 2015-10-05 DIAGNOSIS — J45909 Unspecified asthma, uncomplicated: Secondary | ICD-10-CM | POA: Diagnosis not present

## 2015-10-05 DIAGNOSIS — Z79899 Other long term (current) drug therapy: Secondary | ICD-10-CM | POA: Diagnosis not present

## 2015-10-05 DIAGNOSIS — F1290 Cannabis use, unspecified, uncomplicated: Secondary | ICD-10-CM | POA: Insufficient documentation

## 2015-10-05 LAB — BASIC METABOLIC PANEL
Anion gap: 9 (ref 5–15)
BUN: 17 mg/dL (ref 6–20)
CO2: 26 mmol/L (ref 22–32)
Calcium: 10.2 mg/dL (ref 8.9–10.3)
Chloride: 104 mmol/L (ref 101–111)
Creatinine, Ser: 1.21 mg/dL (ref 0.61–1.24)
GFR calc Af Amer: >60 mL/min (ref >60)
GFR calc non Af Amer: >60 mL/min (ref >60)
Glucose, Bld: 89 mg/dL (ref 65–99)
Potassium: 3.7 mmol/L (ref 3.5–5.1)
Sodium: 139 mmol/L (ref 135–145)

## 2015-10-05 LAB — CBC
HCT: 48.7 % (ref 39.0–52.0)
Hemoglobin: 16.4 g/dL (ref 13.0–17.0)
MCH: 28.5 pg (ref 26.0–34.0)
MCHC: 33.7 g/dL (ref 30.0–36.0)
MCV: 84.5 fL (ref 78.0–100.0)
Platelets: 180 10*3/uL (ref 150–400)
RBC: 5.76 MIL/uL (ref 4.22–5.81)
RDW: 14.4 % (ref 11.5–15.5)
WBC: 7.6 10*3/uL (ref 4.0–10.5)

## 2015-10-05 LAB — I-STAT TROPONIN, ED: Troponin i, poc: 0.01 ng/mL (ref 0.00–0.08)

## 2015-10-05 MED ORDER — IBUPROFEN 200 MG PO TABS
400.0000 mg | ORAL_TABLET | Freq: Once | ORAL | Status: AC
  Administered 2015-10-05: 400 mg via ORAL
  Filled 2015-10-05: qty 2

## 2015-10-05 NOTE — ED Provider Notes (Signed)
[facility]-EMERGENCY DEPT Provider Note   CSN: [id number] Arrival date & time: 10/05/15  2000  By signing my name below, I, Warren Mcbride, attest that this documentation has been prepared under the direction and in the presence of Raeford RazorStephen Avereigh Spainhower, MD. Electronically Signed: Rosario AdieWilliam Andrew Mcbride, ED Scribe. 10/05/15. 9:06 PM.  History   Chief Complaint Chief Complaint  Patient presents with  . Chest Pain   The history is provided by the patient. No language interpreter was used.   HPI Comments: Warren Mcbride is a 20 y.o. male BIB EMS, with a PMHx of Mobitz I AV block, polysubstance abuse, and anxiety, who presents to the Emergency Department complaining of sudden onset, intermittent left-sided chest pain onset ~5 hours ago. He describes his pain as sharp, rates it 4/10, and states that "it feels like someone is sitting on my chest". He notes associated SOB during his episodes of pain. Pt reports a hx of similar pain and has been seen by his PCP with no specific dx or referral for f/u w/ Cardiology. Denies diaphoresis, leg swelling, leg pain, or any other associated symptoms.   Past Medical History:  Diagnosis Date  . ADHD (attention deficit hyperactivity disorder)   . Anxiety   . Asthma   . Depression   . 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 Johney FrameAllred - no PPM indicated at the time; AV block due to increased vagal tone    Patient Active Problem List   Diagnosis Date Noted  . MDD (major depressive disorder), recurrent severe, without psychosis (HCC) 09/10/2015  . Depression 05/10/2015  . Bipolar I disorder, most recent episode depressed (HCC) 05/08/2015  . Polysubstance dependence including opioid type drug, episodic abuse (HCC) 05/08/2015  . Overdose of benzodiazepine   . Sinus pause   . Overdose 05/07/2015  . Tachypnea 05/07/2015  . History of asthma 05/07/2015  . Blurred vision 05/07/2015  . First degree AV block 05/07/2015  . Syncope 05/18/2013   Past  Surgical History:  Procedure Laterality Date  . NO PAST SURGERIES      Home Medications    Prior to Admission medications   Medication Sig Start Date End Date Taking? Authorizing Provider  citalopram (CELEXA) 20 MG tablet Take 1 tablet (20 mg total) by mouth daily. For depression 09/12/15   Sanjuana KavaAgnes I Nwoko, NP  lamoTRIgine (LAMICTAL) 25 MG tablet Take 1 tablet (25 mg total) by mouth daily. For mood stabilization 09/12/15   Sanjuana KavaAgnes I Nwoko, NP  traZODone (DESYREL) 50 MG tablet Take 1 tablet (50 mg total) by mouth at bedtime as needed and may repeat dose one time if needed for sleep. 09/12/15   Sanjuana KavaAgnes I Nwoko, NP   Family History History reviewed. No pertinent family history.  Social History Social History  Substance Use Topics  . Smoking status: Current Every Day Smoker    Packs/day: 0.50    Years: 4.00    Types: Cigarettes  . Smokeless tobacco: Never Used  . Alcohol use Yes     Comment: occasional    Allergies   Review of patient's allergies indicates no known allergies.  Review of Systems Review of Systems  Constitutional: Negative for diaphoresis.  Respiratory: Positive for shortness of breath.   Cardiovascular: Positive for chest pain. Negative for leg swelling.  All other systems reviewed and are negative.  Physical Exam Updated Vital Signs BP 116/69 (BP Location: Right Arm)   Pulse 73   Temp 97.9 F (36.6 C) (Oral)  Resp 18   SpO2 98%   Physical Exam  Constitutional: He appears well-developed and well-nourished. No distress.  HENT:  Head: Normocephalic and atraumatic.  Mouth/Throat: Oropharynx is clear and moist. No oropharyngeal exudate.  Eyes: Conjunctivae and EOM are normal. Pupils are equal, round, and reactive to light. Right eye exhibits no discharge. Left eye exhibits no discharge. No scleral icterus.  Neck: Normal range of motion. Neck supple. No JVD present. No thyromegaly present.  Cardiovascular: Normal rate, regular rhythm, normal heart sounds and intact  distal pulses.  Exam reveals no gallop and no friction rub.   No murmur heard. Pulmonary/Chest: Effort normal and breath sounds normal. No respiratory distress. He has no wheezes. He has no rales.  Abdominal: Soft. Bowel sounds are normal. He exhibits no distension and no mass. There is no tenderness.  Musculoskeletal: Normal range of motion. He exhibits no edema or tenderness.  Lymphadenopathy:    He has no cervical adenopathy.  Neurological: He is alert. Coordination normal.  Skin: Skin is warm and dry. No rash noted. No erythema.  Psychiatric: He has a normal mood and affect. His behavior is normal.  Nursing note and vitals reviewed.  ED Treatments / Results  DIAGNOSTIC STUDIES: Oxygen Saturation is 98% on RA, normal by my interpretation.   COORDINATION OF CARE: 9:06 PM-Discussed next steps with pt. Pt verbalized understanding and is agreeable with the plan.   Labs (all labs ordered are listed, but only abnormal results are displayed) Labs Reviewed  BASIC METABOLIC PANEL  CBC  I-STAT TROPOININ, ED   EKG  EKG Interpretation  Date/Time:  Sunday October 05 2015 20:19:27 EDT Ventricular Rate:  80 PR Interval:    QRS Duration: 90 QT Interval:  355 QTC Calculation: 410 R Axis:   78 Text Interpretation:  Sinus rhythm Prolonged PR interval ST elev, probable normal early repol pattern Confirmed by Juleen China  MD, Laray Corbit [phone]) on 10/05/2015 8:34:29 PM      Radiology Dg Chest 2 View  Result Date: 10/05/2015 CLINICAL DATA:  Chest pain.  Cardiac arrhythmia EXAM: CHEST  2 VIEW COMPARISON:  May 18, 2015 FINDINGS: Lungs are clear. Heart size and pulmonary vascularity are normal. No adenopathy. No pneumothorax. No bone lesions. IMPRESSION: No abnormality noted. Electronically Signed   By: Bretta Bang III M.D.   On: 10/05/2015 21:21    Procedures Procedures (including critical care time)  Medications Ordered in ED Medications - No data to display  Initial Impression /  Assessment and Plan / ED Course  I have reviewed the triage vital signs and the nursing notes.  Pertinent labs & imaging results that were available during my care of the patient were reviewed by me and considered in my medical decision making (see chart for details).  Clinical Course   19yM with CP. Doubt ACS, PE, dissection, infection or other emergent process.  Final Clinical Impressions(s) / ED Diagnoses   Final diagnoses:  Atypical chest pain   New Prescriptions New Prescriptions   No medications on file   I personally preformed the services scribed in my presence. The recorded information has been reviewed is accurate. Raeford Razor, MD.     Raeford Razor, MD 10/06/15 [phone]

## 2015-10-05 NOTE — ED Triage Notes (Signed)
Pt c/o chest pain that started around 4 pm, describes it as numbness. 5/10.

## 2015-10-05 NOTE — ED Notes (Signed)
Ambulatory and independent at discharge. Verbalized understanding of discharge instructions. 

## 2015-10-05 NOTE — ED Notes (Signed)
Blue save tube was sent down to lab.

## 2016-04-04 DIAGNOSIS — Z79899 Other long term (current) drug therapy: Secondary | ICD-10-CM | POA: Insufficient documentation

## 2016-04-04 DIAGNOSIS — R1110 Vomiting, unspecified: Secondary | ICD-10-CM | POA: Diagnosis present

## 2016-04-04 DIAGNOSIS — J45909 Unspecified asthma, uncomplicated: Secondary | ICD-10-CM | POA: Diagnosis not present

## 2016-04-04 DIAGNOSIS — F909 Attention-deficit hyperactivity disorder, unspecified type: Secondary | ICD-10-CM | POA: Insufficient documentation

## 2016-04-04 DIAGNOSIS — F17210 Nicotine dependence, cigarettes, uncomplicated: Secondary | ICD-10-CM | POA: Insufficient documentation

## 2016-04-04 DIAGNOSIS — A084 Viral intestinal infection, unspecified: Secondary | ICD-10-CM | POA: Insufficient documentation

## 2016-04-05 ENCOUNTER — Encounter (HOSPITAL_COMMUNITY): Payer: Self-pay | Admitting: Oncology

## 2016-04-05 ENCOUNTER — Emergency Department (HOSPITAL_COMMUNITY)
Admission: EM | Admit: 2016-04-05 | Discharge: 2016-04-05 | Disposition: A | Discharge status: Home / Self Care | Payer: 59 | Attending: Emergency Medicine | Admitting: Emergency Medicine

## 2016-04-05 DIAGNOSIS — A084 Viral intestinal infection, unspecified: Secondary | ICD-10-CM

## 2016-04-05 LAB — COMPREHENSIVE METABOLIC PANEL
ALT: 54 U/L (ref 17–63)
ANION GAP: 6 (ref 5–15)
AST: 47 U/L — ABNORMAL HIGH (ref 15–41)
Albumin: 4.2 g/dL (ref 3.5–5.0)
Alkaline Phosphatase: 105 U/L (ref 38–126)
BILIRUBIN TOTAL: 0.9 mg/dL (ref 0.3–1.2)
BUN: 13 mg/dL (ref 6–20)
CALCIUM: 9.5 mg/dL (ref 8.9–10.3)
CO2: 26 mmol/L (ref 22–32)
Chloride: 106 mmol/L (ref 101–111)
Creatinine, Ser: 1.64 mg/dL — ABNORMAL HIGH (ref 0.61–1.24)
GFR calc non Af Amer: 58 mL/min — ABNORMAL LOW (ref >60)
Glucose, Bld: 108 mg/dL — ABNORMAL HIGH (ref 65–99)
POTASSIUM: 4.1 mmol/L (ref 3.5–5.1)
SODIUM: 138 mmol/L (ref 135–145)
TOTAL PROTEIN: 7.4 g/dL (ref 6.5–8.1)

## 2016-04-05 LAB — URINALYSIS, ROUTINE W REFLEX MICROSCOPIC
BACTERIA UA: NONE SEEN
BILIRUBIN URINE: NEGATIVE
GLUCOSE, UA: NEGATIVE mg/dL
Hgb urine dipstick: NEGATIVE
KETONES UR: NEGATIVE mg/dL
NITRITE: NEGATIVE
PROTEIN: NEGATIVE mg/dL
Specific Gravity, Urine: 1.023 (ref 1.005–1.030)
Squamous Epithelial / LPF: NONE SEEN
pH: 5 (ref 5.0–8.0)

## 2016-04-05 LAB — CBC
HEMATOCRIT: 44.1 % (ref 39.0–52.0)
HEMOGLOBIN: 14.6 g/dL (ref 13.0–17.0)
MCH: 28.3 pg (ref 26.0–34.0)
MCHC: 33.1 g/dL (ref 30.0–36.0)
MCV: 85.6 fL (ref 78.0–100.0)
Platelets: 168 10*3/uL (ref 150–400)
RBC: 5.15 MIL/uL (ref 4.22–5.81)
RDW: 14.3 % (ref 11.5–15.5)
WBC: 7.8 10*3/uL (ref 4.0–10.5)

## 2016-04-05 LAB — LIPASE, BLOOD: Lipase: 23 U/L (ref 11–51)

## 2016-04-05 MED ORDER — ONDANSETRON HCL 4 MG/2ML IJ SOLN
4.0000 mg | Freq: Once | INTRAMUSCULAR | Status: AC
  Administered 2016-04-05: 4 mg via INTRAVENOUS
  Filled 2016-04-05: qty 2

## 2016-04-05 MED ORDER — OMEPRAZOLE 20 MG PO CPDR: 20.0000 mg | DELAYED_RELEASE_CAPSULE | Freq: Every day | ORAL | 0 refills | Status: DC

## 2016-04-05 MED ORDER — ONDANSETRON 8 MG PO TBDP: 8.0000 mg | ORAL_TABLET | Freq: Three times a day (TID) | ORAL | 0 refills | Status: DC | PRN

## 2016-04-05 MED ORDER — SODIUM CHLORIDE 0.9 % IV BOLUS (SEPSIS)
1000.0000 mL | Freq: Once | INTRAVENOUS | Status: AC
  Administered 2016-04-05: 1000 mL via INTRAVENOUS

## 2016-04-05 MED ORDER — SUCRALFATE 1 G PO TABS
1.0000 g | ORAL_TABLET | Freq: Once | ORAL | Status: AC
  Administered 2016-04-05: 1 g via ORAL
  Filled 2016-04-05: qty 1

## 2016-04-05 MED ORDER — PANTOPRAZOLE SODIUM 40 MG IV SOLR
40.0000 mg | Freq: Once | INTRAVENOUS | Status: AC
  Administered 2016-04-05: 40 mg via INTRAVENOUS
  Filled 2016-04-05: qty 40

## 2016-04-05 MED ORDER — VANCOMYCIN HCL IN DEXTROSE 1-5 GM/200ML-% IV SOLN: 1000.0000 mg | Freq: Once | INTRAVENOUS | Status: DC

## 2016-04-05 MED ORDER — PIPERACILLIN-TAZOBACTAM 3.375 G IVPB 30 MIN: 3.3750 g | Freq: Once | INTRAVENOUS | Status: DC

## 2016-04-05 NOTE — ED Notes (Signed)
ED Provider at bedside. 

## 2016-04-05 NOTE — ED Provider Notes (Signed)
[facility] DEPT Provider Note: Lowella Dell, MD, FACEP  CSN: [id number] MRN: [id number] ARRIVAL: 04/04/16 at 2358 ROOM: WA09/WA09  By signing my name below, I, Clarisse Gouge, attest that this documentation has been prepared under the direction and in the presence of [name], MD. Electronically signed, Clarisse Gouge, ED Scribe. 04/05/16. 12:41 AM.  CHIEF COMPLAINT  Vomiting   HISTORY OF PRESENT ILLNESS  HPI Comments: Warren Mcbride is a 21 y.o. male who presents to the Emergency Department complaining of vomiting x 3-4 days. He notes associated 6/10 chest pain onset yesterday that is worse when lying down and better when sitting up, increased thirst, diarrhea, nausea and SOB. He notes some gastric reflux in the throat, and he states he has only been able to hold down water and crackers yesterday. Pt denies light headedness or abdominal pain.   Past Medical History:  Diagnosis Date  . ADHD (attention deficit hyperactivity disorder)   . Anxiety   . Asthma   . Depression   . 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 Johney Frame - no PPM indicated at the time; AV block due to increased vagal tone     Past Surgical History:  Procedure Laterality Date  . NO PAST SURGERIES      No family history on file.  Social History  Substance Use Topics  . Smoking status: Current Every Day Smoker    Packs/day: 0.50    Years: 4.00    Types: Cigarettes  . Smokeless tobacco: Never Used  . Alcohol use Yes     Comment: occasional     Prior to Admission medications   Medication Sig Start Date End Date Taking? Authorizing Provider  ibuprofen (ADVIL,MOTRIN) 200 MG tablet Take 400 mg by mouth every 6 (six) hours as needed for headache, mild pain or moderate pain.   Yes Historical Provider, MD  citalopram (CELEXA) 20 MG tablet Take 1 tablet (20 mg total) by mouth daily. For depression Patient not taking: Reported on 04/05/2016 09/12/15   Sanjuana Kava, NP  lamoTRIgine (LAMICTAL)  25 MG tablet Take 1 tablet (25 mg total) by mouth daily. For mood stabilization Patient not taking: Reported on 04/05/2016 09/12/15   Sanjuana Kava, NP  traZODone (DESYREL) 50 MG tablet Take 1 tablet (50 mg total) by mouth at bedtime as needed and may repeat dose one time if needed for sleep. Patient not taking: Reported on 04/05/2016 09/12/15   Sanjuana Kava, NP    Allergies Patient has no known allergies.   REVIEW OF SYSTEMS  Negative except as noted here or in the History of Present Illness.   PHYSICAL EXAMINATION  Initial Vital Signs Blood pressure 133/76, pulse 84, temperature 97.5 F (36.4 C), temperature source Oral, resp. rate (!) 23, height 5\' 11"  (1.803 m), weight 270 lb (122.5 kg), SpO2 98 %.  Examination General: Well-developed, well-nourished male in no acute distress; appearance consistent with age of record HENT: normocephalic; atraumatic Eyes: pupils equal, round and reactive to light; extraocular muscles intact Neck: supple Heart: regular rate and rhythm Lungs: clear to auscultation bilaterally Abdomen: soft; nondistended; mild epigastric tenderness; no masses or hepatosplenomegaly; bowel sounds present Extremities: No deformity; full range of motion; pulses normal Neurologic: Awake, alert and oriented; motor function intact in all extremities and symmetric; no facial droop Skin: Warm and dry Psychiatric: Normal mood and affect   RESULTS  Summary of this visit's results, reviewed by myself:   EKG Interpretation  Date/Time:  Monday April 05 2016 00:14:20 EDT Ventricular Rate:  75 PR Interval:    QRS Duration: 94 QT Interval:  369 QTC Calculation: 413 R Axis:   81 Text Interpretation:  Sinus rhythm Prolonged PR interval No significant change was found Confirmed by Loraina Stauffer  MD, Jonny Ruiz ([id number]) on 04/05/2016 12:28:39 AM      Laboratory Studies: Results for orders placed or performed during the hospital encounter of 04/05/16 (from the past 24 hour(s))  Lipase,  blood     Status: None   Collection Time: 04/05/16 12:22 AM  Result Value Ref Range   Lipase 23 11 - 51 U/L  Comprehensive metabolic panel     Status: Abnormal   Collection Time: 04/05/16 12:22 AM  Result Value Ref Range   Sodium 138 135 - 145 mmol/L   Potassium 4.1 3.5 - 5.1 mmol/L   Chloride 106 101 - 111 mmol/L   CO2 26 22 - 32 mmol/L   Glucose, Bld 108 (H) 65 - 99 mg/dL   BUN 13 6 - 20 mg/dL   Creatinine, Ser 1.91 (H) 0.61 - 1.24 mg/dL   Calcium 9.5 8.9 - 47.8 mg/dL   Total Protein 7.4 6.5 - 8.1 g/dL   Albumin 4.2 3.5 - 5.0 g/dL   AST 47 (H) 15 - 41 U/L   ALT 54 17 - 63 U/L   Alkaline Phosphatase 105 38 - 126 U/L   Total Bilirubin 0.9 0.3 - 1.2 mg/dL   GFR calc non Af Amer 58 (L) >60 mL/min   GFR calc Af Amer >60 >60 mL/min   Anion gap 6 5 - 15  CBC     Status: None   Collection Time: 04/05/16 12:22 AM  Result Value Ref Range   WBC 7.8 4.0 - 10.5 K/uL   RBC 5.15 4.22 - 5.81 MIL/uL   Hemoglobin 14.6 13.0 - 17.0 g/dL   HCT 29.5 62.1 - 30.8 %   MCV 85.6 78.0 - 100.0 fL   MCH 28.3 26.0 - 34.0 pg   MCHC 33.1 30.0 - 36.0 g/dL   RDW 65.7 84.6 - 96.2 %   Platelets 168 150 - 400 K/uL  Urinalysis, Routine w reflex microscopic     Status: Abnormal   Collection Time: 04/05/16 12:22 AM  Result Value Ref Range   Color, Urine YELLOW YELLOW   APPearance CLEAR CLEAR   Specific Gravity, Urine 1.023 1.005 - 1.030   pH 5.0 5.0 - 8.0   Glucose, UA NEGATIVE NEGATIVE mg/dL   Hgb urine dipstick NEGATIVE NEGATIVE   Bilirubin Urine NEGATIVE NEGATIVE   Ketones, ur NEGATIVE NEGATIVE mg/dL   Protein, ur NEGATIVE NEGATIVE mg/dL   Nitrite NEGATIVE NEGATIVE   Leukocytes, UA SMALL (A) NEGATIVE   RBC / HPF 0-5 0 - 5 RBC/hpf   WBC, UA 6-30 0 - 5 WBC/hpf   Bacteria, UA NONE SEEN NONE SEEN   Squamous Epithelial / LPF NONE SEEN NONE SEEN   Mucous PRESENT    Imaging Studies: No results found.  ED COURSE  Nursing notes and initial vitals signs, including pulse oximetry, reviewed.  Vitals:     04/05/16 0018 04/05/16 0019 04/05/16 0100 04/05/16 0130  BP: 133/76  (!) 118/59 (!) 115/53  Pulse: 84  78 84  Resp: (!) 23  (!) 25 (!) 21  Temp: 97.5 F (36.4 C)     TempSrc: Oral     SpO2: 98%  97% 98%  Weight:  270 lb (122.5 kg)    Height:  5\' 11"  (1.803 m)     1:36 AM Patient's chest discomfort has improved after IV Protonix. He is drinking fluids without emesis after IV fluid bolus and Zofran.Suspect chest discomfort due to vomiting.  PROCEDURES    ED DIAGNOSES     ICD-9-CM ICD-10-CM   1. Viral gastroenteritis 008.8 A08.4      I personally performed the services described in this documentation, which was scribed in my presence. The recorded information has been reviewed and is accurate.    Paula LibraJohn Peightyn Roberson, MD 04/05/16 [phone]

## 2016-04-05 NOTE — ED Triage Notes (Signed)
Pt c/o N/V, chest pain and shob.  Sx began Friday.  Per pt his s/o is recovering from the flu.  Rates chest pain 6/10, tight in nature.

## 2016-04-11 ENCOUNTER — Emergency Department (HOSPITAL_COMMUNITY)
Admission: EM | Admit: 2016-04-11 | Discharge: 2016-04-11 | Disposition: A | Discharge status: Home / Self Care | Payer: 59 | Attending: Emergency Medicine | Admitting: Emergency Medicine

## 2016-04-11 ENCOUNTER — Encounter (HOSPITAL_COMMUNITY): Payer: Self-pay | Admitting: Emergency Medicine

## 2016-04-11 DIAGNOSIS — F909 Attention-deficit hyperactivity disorder, unspecified type: Secondary | ICD-10-CM | POA: Diagnosis not present

## 2016-04-11 DIAGNOSIS — R55 Syncope and collapse: Secondary | ICD-10-CM | POA: Diagnosis present

## 2016-04-11 DIAGNOSIS — J45909 Unspecified asthma, uncomplicated: Secondary | ICD-10-CM | POA: Insufficient documentation

## 2016-04-11 DIAGNOSIS — F17210 Nicotine dependence, cigarettes, uncomplicated: Secondary | ICD-10-CM | POA: Insufficient documentation

## 2016-04-11 DIAGNOSIS — R000 Tachycardia, unspecified: Secondary | ICD-10-CM | POA: Diagnosis not present

## 2016-04-11 DIAGNOSIS — Z79899 Other long term (current) drug therapy: Secondary | ICD-10-CM | POA: Insufficient documentation

## 2016-04-11 LAB — CBC WITH DIFFERENTIAL/PLATELET
Basophils Absolute: 0 10*3/uL (ref 0.0–0.1)
Basophils Relative: 0 %
Eosinophils Absolute: 0.2 10*3/uL (ref 0.0–0.7)
Eosinophils Relative: 3 %
HEMATOCRIT: 44.5 % (ref 39.0–52.0)
Hemoglobin: 14.5 g/dL (ref 13.0–17.0)
LYMPHS ABS: 2.6 10*3/uL (ref 0.7–4.0)
Lymphocytes Relative: 35 %
MCH: 27.9 pg (ref 26.0–34.0)
MCHC: 32.6 g/dL (ref 30.0–36.0)
MCV: 85.7 fL (ref 78.0–100.0)
MONO ABS: 0.4 10*3/uL (ref 0.1–1.0)
MONOS PCT: 6 %
NEUTROS ABS: 4.1 10*3/uL (ref 1.7–7.7)
Neutrophils Relative %: 57 %
Platelets: 163 10*3/uL (ref 150–400)
RBC: 5.19 MIL/uL (ref 4.22–5.81)
RDW: 14.2 % (ref 11.5–15.5)
WBC: 7.3 10*3/uL (ref 4.0–10.5)

## 2016-04-11 LAB — BASIC METABOLIC PANEL
Anion gap: 11 (ref 5–15)
BUN: 14 mg/dL (ref 6–20)
CALCIUM: 9.6 mg/dL (ref 8.9–10.3)
CO2: 21 mmol/L — AB (ref 22–32)
Chloride: 106 mmol/L (ref 101–111)
Creatinine, Ser: 1.22 mg/dL (ref 0.61–1.24)
GFR calc Af Amer: >60 mL/min (ref >60)
GFR calc non Af Amer: >60 mL/min (ref >60)
GLUCOSE: 108 mg/dL — AB (ref 65–99)
Potassium: 4 mmol/L (ref 3.5–5.1)
Sodium: 138 mmol/L (ref 135–145)

## 2016-04-11 LAB — CBG MONITORING, ED: Glucose-Capillary: 117 mg/dL — ABNORMAL HIGH (ref 65–99)

## 2016-04-11 NOTE — ED Triage Notes (Signed)
Pt was at work this morning and got over heated and dizzy and was sent home.  Pt needs note for work stating he is clear to go back to work.  No complaints at this time. Ambulatory.  A&O x4.

## 2016-04-11 NOTE — ED Provider Notes (Signed)
[facility] Provider Note   CSN: [id number] Arrival date & time: 04/11/16  1944  By signing my name below, I, Warren Mcbride, attest that this documentation has been prepared under the direction and in the presence of Arthor Captain, PA-C. Electronically Signed: Lyndon Code., ED Scribe. 04/25/16. 9:06 PM.   History   Chief Complaint Chief Complaint  Patient presents with  . Needs Clearing for Work    HPI Warren Mcbride is a 21 y.o. male who presents to the Emergency Department complaining of near-syncope with onset x1 day. 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 He was asked to go home and get medically cleared before returning to work. Pt denies dizziness at the moment. Of note, pt has a  hx of episodes of this nature which he attributes to his hx of heart block. He denies any daily medications.  The history is provided by the patient. No language interpreter was used.    Past Medical History:  Diagnosis Date  . ADHD (attention deficit hyperactivity disorder)   . Anxiety   . Asthma   . Depression   . 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 Johney Frame - no PPM indicated at the time; AV block due to increased vagal tone     Patient Active Problem List   Diagnosis Date Noted  . MDD (major depressive disorder), recurrent severe, without psychosis (HCC) 09/10/2015  . Depression 05/10/2015  . Bipolar I disorder, most recent episode depressed (HCC) 05/08/2015  . Polysubstance dependence including opioid type drug, episodic abuse (HCC) 05/08/2015  . Overdose of benzodiazepine   . Sinus pause   . Overdose 05/07/2015  . Tachypnea 05/07/2015  . History of asthma 05/07/2015  . Blurred vision 05/07/2015  . First degree AV block 05/07/2015  . Syncope 05/18/2013    Past Surgical History:  Procedure Laterality Date  . NO PAST SURGERIES         Home Medications    Prior to  Admission medications   Medication Sig Start Date End Date Taking? Authorizing Provider  ibuprofen (ADVIL,MOTRIN) 200 MG tablet Take 400 mg by mouth every 6 (six) hours as needed for headache, mild pain or moderate pain.    Historical Provider, MD  omeprazole (PRILOSEC) 20 MG capsule Take 1 capsule (20 mg total) by mouth daily. 04/05/16   [name], MD  ondansetron (ZOFRAN ODT) 8 MG disintegrating tablet Take 1 tablet (8 mg total) by mouth every 8 (eight) hours as needed for nausea or vomiting. 04/05/16   [name], MD    Family History Family History  Problem Relation Age of Onset  . Hypertension Father     Social History Social History  Substance Use Topics  . Smoking status: Current Every Day Smoker    Packs/day: 0.50    Years: 4.00    Types: Cigarettes  . Smokeless tobacco: Never Used  . Alcohol use Yes     Comment: occasional      Allergies   Patient has no known allergies.   Review of Systems Review of Systems  Constitutional: Negative for fever.  Neurological: Negative for dizziness and light-headedness.       Near-syncope     Physical Exam Updated Vital Signs BP (!) 142/80 (BP Location: Left Arm)   Pulse (!) 106   [id number] F (37.1 C) (Oral)   Resp 20   Ht  (  1.803 m)   Wt 122.5 kg   SpO2 99%   BMI 37.66 kg/m   Physical Exam  Constitutional: He appears well-developed and well-nourished. No distress.  HENT:  Head: Normocephalic and atraumatic.  Eyes: Conjunctivae are normal. No scleral icterus.  Neck: Normal range of motion. Neck supple.  Cardiovascular: Normal rate, regular rhythm and normal heart sounds.   Pulmonary/Chest: Effort normal and breath sounds normal. No respiratory distress.  Abdominal: Soft. There is no tenderness.  Musculoskeletal: He exhibits no edema.  Neurological: He is alert.  Skin: Skin is warm and dry. He is not diaphoretic.  Psychiatric: His behavior is normal.  Nursing note and vitals reviewed.    ED  Treatments / Results   DIAGNOSTIC STUDIES: Oxygen Saturation is 98% on RA, normal by my interpretation.   COORDINATION OF CARE: 9:06 PM-Discussed next steps with pt. Pt verbalized understanding and is agreeable with the plan.    Labs (all labs ordered are listed, but only abnormal results are displayed) Labs Reviewed  BASIC METABOLIC PANEL - Abnormal; Notable for the following:       Result Value   CO2 21 (*)    Glucose, Bld 108 (*)    All other components within normal limits  CBG MONITORING, ED - Abnormal; Notable for the following:    Glucose-Capillary 117 (*)    All other components within normal limits  CBC WITH DIFFERENTIAL/PLATELET  POCT CBG (FASTING - GLUCOSE)-MANUAL ENTRY    EKG  EKG Interpretation  Date/Time:  Sunday April 11 2016 22:14:57 EDT Ventricular Rate:  109 PR Interval:    QRS Duration: 93 QT Interval:  326 QTC Calculation: 439 R Axis:   85 Text Interpretation:  Sinus tachycardia ST elevation suggests acute pericarditis Confirmed by Deretha Emory  MD, SCOTT ([id number]) on 04/11/2016 10:20:11 PM Also confirmed by Deretha Emory  MD, SCOTT [phone]), editor [place]  CCT, BEVERLY ([id number])  on 04/12/2016 7:25:47 AM       Radiology No results found.  Procedures Procedures (including critical care time)  Medications Ordered in ED Medications - No data to display   Initial Impression / Assessment and Plan / ED Course  I have reviewed the triage vital signs and the nursing notes.  Pertinent labs & imaging results that were available during my care of the patient were reviewed by me and considered in my medical decision making (see chart for details).    Vitals:   04/11/16 2009 04/11/16 2046 04/11/16 2156  BP: (!) 142/80    Pulse: (!) 119  (!) 106  Resp: 20    Temp: 98.7 F (37.1 C)    TempSrc: Oral    SpO2: 98%  99%  Weight:  122.5 kg   Height:   (1.803 m)     Patients labs, ekg and imaging negative. He did not fully loose consciousness or  collapse. His ekg shows sinus tachycardia. He is able to move and ambulate without dizziness. Although he is still somewhat tachycardic he denies any cp or SOB. He denies hemoptysis and he has no hx of blood clots. He has an established relationship with cardiology and agrees to follow closely with them early this week. Discussed reasons to seek immediate medical care.   Final Clinical Impressions(s) / ED Diagnoses   Final diagnoses:  Near syncope  Sinus tachycardia    New Prescriptions Discharge Medication List as of 04/11/2016 11:27 PM     I personally performed the services described in this documentation, which was scribed in my  presence. The recorded information has been reviewed and is accurate.       Arthor Captain, PA-C 04/25/16 2116    Vanetta Mulders, MD 04/26/16 [phone]

## 2016-04-11 NOTE — Discharge Instructions (Addendum)
Please drink plenty of  water.  Get help right away if: You have a severe headache. You have unusual pain in your chest, abdomen, or back. You are bleeding from your mouth or rectum, or you have black or tarry stool. You have a very fast or irregular heartbeat (palpitations). You faint once or repeatedly. You have a seizure. You are confused. You have trouble walking. You have severe weakness. You have vision problems.  In addition would recommend you follow-up with cardiology again. Return for any additional passing out.

## 2016-04-11 NOTE — ED Notes (Signed)
ED Provider at bedside. 

## 2016-04-11 NOTE — ED Provider Notes (Signed)
Medical screening examination/treatment/procedure(s) were conducted as a shared visit with non-physician practitioner(s) and myself.  I personally evaluated the patient during the encounter.   EKG Interpretation  Date/Time:  Sunday April 11 2016 22:14:57 EDT Ventricular Rate:  109 PR Interval:    QRS Duration: 93 QT Interval:  326 QTC Calculation: 439 R Axis:   85 Text Interpretation:  Sinus tachycardia ST elevation suggests acute pericarditis Confirmed by Deretha Emory  MD, Sherard Sutch [phone]) on 04/11/2016 10:20:11 PM       Patient seen by me along with the physician assistant. Patient had a syncopal episode that occurred at work. Does seem to be vasovagal but patient also has single episode a few years ago understanding similar exertional circumstances. EKG here with a sinus tach. Labs without significant abnormalities. Patient now completely asymptomatic. Recommend that he follows back up with cardiology.  The patient stated that he was working hard at work hot and sweaty started to get tunnel vision and he was given a go down did not fall rapidly and hit his head. Patient completely asymptomatic right now. Possibly just became overheated and dizzy.  However recommend follow back up with cardiology just to rule out any concerns from a cardiac standpoint.   Vanetta Mulders, MD 04/11/16 2329

## 2016-04-17 ENCOUNTER — Encounter (HOSPITAL_COMMUNITY): Payer: Self-pay | Admitting: Emergency Medicine

## 2016-04-17 ENCOUNTER — Emergency Department (HOSPITAL_COMMUNITY)
Admission: EM | Admit: 2016-04-17 | Discharge: 2016-04-17 | Disposition: A | Discharge status: Home / Self Care | Payer: 59 | Attending: Emergency Medicine | Admitting: Emergency Medicine

## 2016-04-17 DIAGNOSIS — R51 Headache: Secondary | ICD-10-CM | POA: Insufficient documentation

## 2016-04-17 DIAGNOSIS — Y939 Activity, unspecified: Secondary | ICD-10-CM | POA: Insufficient documentation

## 2016-04-17 DIAGNOSIS — Z79899 Other long term (current) drug therapy: Secondary | ICD-10-CM | POA: Diagnosis not present

## 2016-04-17 DIAGNOSIS — F909 Attention-deficit hyperactivity disorder, unspecified type: Secondary | ICD-10-CM | POA: Insufficient documentation

## 2016-04-17 DIAGNOSIS — F17210 Nicotine dependence, cigarettes, uncomplicated: Secondary | ICD-10-CM | POA: Diagnosis not present

## 2016-04-17 DIAGNOSIS — J45909 Unspecified asthma, uncomplicated: Secondary | ICD-10-CM | POA: Insufficient documentation

## 2016-04-17 DIAGNOSIS — M79605 Pain in left leg: Secondary | ICD-10-CM | POA: Insufficient documentation

## 2016-04-17 DIAGNOSIS — Y92410 Unspecified street and highway as the place of occurrence of the external cause: Secondary | ICD-10-CM | POA: Diagnosis not present

## 2016-04-17 DIAGNOSIS — S63501A Unspecified sprain of right wrist, initial encounter: Secondary | ICD-10-CM | POA: Diagnosis not present

## 2016-04-17 DIAGNOSIS — S6991XA Unspecified injury of right wrist, hand and finger(s), initial encounter: Secondary | ICD-10-CM | POA: Diagnosis present

## 2016-04-17 DIAGNOSIS — Y999 Unspecified external cause status: Secondary | ICD-10-CM | POA: Diagnosis not present

## 2016-04-17 NOTE — Discharge Instructions (Signed)
Please read attached information. If you experience any new or worsening signs or symptoms please return to the emergency room for evaluation. Please follow-up with your primary care provider or specialist as discussed.  Please use Tylenol or ibuprofen as needed for discomfort. °
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
## 2016-05-14 ENCOUNTER — Encounter (HOSPITAL_COMMUNITY): Payer: Self-pay | Admitting: Emergency Medicine

## 2016-05-14 ENCOUNTER — Emergency Department (HOSPITAL_COMMUNITY): Payer: 59

## 2016-05-14 ENCOUNTER — Emergency Department (HOSPITAL_COMMUNITY)
Admission: EM | Admit: 2016-05-14 | Discharge: 2016-05-14 | Disposition: A | Discharge status: Home / Self Care | Payer: 59 | Attending: Emergency Medicine | Admitting: Emergency Medicine

## 2016-05-14 DIAGNOSIS — F909 Attention-deficit hyperactivity disorder, unspecified type: Secondary | ICD-10-CM | POA: Diagnosis not present

## 2016-05-14 DIAGNOSIS — Z79899 Other long term (current) drug therapy: Secondary | ICD-10-CM | POA: Insufficient documentation

## 2016-05-14 DIAGNOSIS — R112 Nausea with vomiting, unspecified: Secondary | ICD-10-CM

## 2016-05-14 DIAGNOSIS — F17210 Nicotine dependence, cigarettes, uncomplicated: Secondary | ICD-10-CM | POA: Insufficient documentation

## 2016-05-14 DIAGNOSIS — R197 Diarrhea, unspecified: Secondary | ICD-10-CM | POA: Diagnosis not present

## 2016-05-14 DIAGNOSIS — E860 Dehydration: Secondary | ICD-10-CM | POA: Diagnosis not present

## 2016-05-14 DIAGNOSIS — J45909 Unspecified asthma, uncomplicated: Secondary | ICD-10-CM | POA: Diagnosis not present

## 2016-05-14 LAB — CBC
HCT: 45.5 % (ref 39.0–52.0)
HEMOGLOBIN: 15.5 g/dL (ref 13.0–17.0)
MCH: 29.1 pg (ref 26.0–34.0)
MCHC: 34.1 g/dL (ref 30.0–36.0)
MCV: 85.5 fL (ref 78.0–100.0)
Platelets: 166 10*3/uL (ref 150–400)
RBC: 5.32 MIL/uL (ref 4.22–5.81)
RDW: 14.2 % (ref 11.5–15.5)
WBC: 6.5 10*3/uL (ref 4.0–10.5)

## 2016-05-14 LAB — URINALYSIS, ROUTINE W REFLEX MICROSCOPIC
BILIRUBIN URINE: NEGATIVE
Glucose, UA: NEGATIVE mg/dL
Hgb urine dipstick: NEGATIVE
Ketones, ur: NEGATIVE mg/dL
LEUKOCYTES UA: NEGATIVE
NITRITE: NEGATIVE
PH: 6 (ref 5.0–8.0)
Protein, ur: NEGATIVE mg/dL
SPECIFIC GRAVITY, URINE: 1.024 (ref 1.005–1.030)

## 2016-05-14 LAB — COMPREHENSIVE METABOLIC PANEL
ALBUMIN: 4.3 g/dL (ref 3.5–5.0)
ALK PHOS: 81 U/L (ref 38–126)
ALT: 66 U/L — ABNORMAL HIGH (ref 17–63)
ANION GAP: 9 (ref 5–15)
AST: 36 U/L (ref 15–41)
BILIRUBIN TOTAL: 0.7 mg/dL (ref 0.3–1.2)
BUN: 13 mg/dL (ref 6–20)
CALCIUM: 9.6 mg/dL (ref 8.9–10.3)
CO2: 24 mmol/L (ref 22–32)
Chloride: 106 mmol/L (ref 101–111)
Creatinine, Ser: 1.11 mg/dL (ref 0.61–1.24)
GLUCOSE: 93 mg/dL (ref 65–99)
POTASSIUM: 3.9 mmol/L (ref 3.5–5.1)
Sodium: 139 mmol/L (ref 135–145)
TOTAL PROTEIN: 7.7 g/dL (ref 6.5–8.1)

## 2016-05-14 LAB — I-STAT TROPONIN, ED: TROPONIN I, POC: 0 ng/mL (ref 0.00–0.08)

## 2016-05-14 LAB — LIPASE, BLOOD: Lipase: 24 U/L (ref 11–51)

## 2016-05-14 MED ORDER — ONDANSETRON HCL 4 MG/2ML IJ SOLN
4.0000 mg | Freq: Once | INTRAMUSCULAR | Status: AC
  Administered 2016-05-14: 4 mg via INTRAVENOUS
  Filled 2016-05-14: qty 2

## 2016-05-14 MED ORDER — SODIUM CHLORIDE 0.9 % IV BOLUS (SEPSIS)
1000.0000 mL | Freq: Once | INTRAVENOUS | Status: AC
  Administered 2016-05-14: 1000 mL via INTRAVENOUS

## 2016-05-14 MED ORDER — ONDANSETRON HCL 4 MG PO TABS: 4.0000 mg | ORAL_TABLET | Freq: Four times a day (QID) | ORAL | 0 refills | Status: DC

## 2016-05-14 NOTE — ED Notes (Signed)
UNSUCCESSFUL ATTEMPT TO COLLECT BLOOD SAMPLE OUT OF RIGHT AC

## 2016-05-14 NOTE — ED Provider Notes (Signed)
[facility]-EMERGENCY DEPT Provider Note   CSN: [id number] Arrival date & time: 05/14/16  1001     History   Chief Complaint Chief Complaint  Patient presents with  . Emesis    HPI Warren Mcbride is a 21 y.o. male.  Patient is a 21 year old male with significant medical history for asthma, depression, polysubstance abuse presenting today with 3 days of nausea vomiting and diarrhea. He denies fever or localized abdominal pain. He had approximately 4 episodes of vomiting and 6 episodes of diarrhea every day for the last 3 days. This morning he started to feel a little bit better. He was able to hold down some food this morning but still feels nauseated. He denies any recent antibiotics or bad food exposure. Call for similar symptoms. No recent medication changes.    The history is provided by the patient.    Past Medical History:  Diagnosis Date  . ADHD (attention deficit hyperactivity disorder)   . Anxiety   . Asthma   . Depression   . 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 Johney Frame - no PPM indicated at the time; AV block due to increased vagal tone     Patient Active Problem List   Diagnosis Date Noted  . MDD (major depressive disorder), recurrent severe, without psychosis (HCC) 09/10/2015  . Depression 05/10/2015  . Bipolar I disorder, most recent episode depressed (HCC) 05/08/2015  . Polysubstance dependence including opioid type drug, episodic abuse (HCC) 05/08/2015  . Overdose of benzodiazepine   . Sinus pause   . Overdose 05/07/2015  . Tachypnea 05/07/2015  . History of asthma 05/07/2015  . Blurred vision 05/07/2015  . First degree AV block 05/07/2015  . Syncope 05/18/2013    Past Surgical History:  Procedure Laterality Date  . NO PAST SURGERIES         Home Medications    Prior to Admission medications   Medication Sig Start Date End Date Taking? Authorizing Provider  ibuprofen (ADVIL,MOTRIN) 200 MG tablet Take 400 mg by mouth every 6 (six)  hours as needed for headache, mild pain or moderate pain.   Yes Historical Provider, MD  omeprazole (PRILOSEC) 20 MG capsule Take 1 capsule (20 mg total) by mouth daily. 04/05/16  Yes [name], MD  ondansetron (ZOFRAN ODT) 8 MG disintegrating tablet Take 1 tablet (8 mg total) by mouth every 8 (eight) hours as needed for nausea or vomiting. Patient not taking: Reported on 05/14/2016 04/05/16   [name], MD    Family History Family History  Problem Relation Age of Onset  . Hypertension Father     Social History Social History  Substance Use Topics  . Smoking status: Current Every Day Smoker    Packs/day: 0.50    Years: 4.00    Types: Cigarettes  . Smokeless tobacco: Never Used  . Alcohol use Yes     Comment: occasional      Allergies   Patient has no known allergies.   Review of Systems Review of Systems  All other systems reviewed and are negative.    Physical Exam Updated Vital Signs BP (!) 142/66 (BP Location: Right Arm)   Pulse (!) 107   Temp 98.5 F (36.9 C) (Oral)   Resp 19   Ht 5\' 11"  (1.803 m)   Wt 280 lb (127 kg)   SpO2 98%   BMI 39.05 kg/m   Physical Exam  Constitutional: He is oriented to person, place, and time. He appears  well-developed and well-nourished. No distress.  HENT:  Head: Normocephalic and atraumatic.  Mouth/Throat: Oropharynx is clear and moist.  Eyes: Conjunctivae and EOM are normal. Pupils are equal, round, and reactive to light.  Neck: Normal range of motion. Neck supple.  Cardiovascular: Normal rate, regular rhythm and intact distal pulses.   No murmur heard. Pulmonary/Chest: Effort normal and breath sounds normal. No respiratory distress. He has no wheezes. He has no rales.  Abdominal: Soft. He exhibits no distension. There is no tenderness. There is no rebound and no guarding.  Musculoskeletal: Normal range of motion. He exhibits no edema or tenderness.  Neurological: He is alert and oriented to person, place, and time.    Skin: Skin is warm and dry. No rash noted. No erythema.  Psychiatric: He has a normal mood and affect. His behavior is normal.  Nursing note and vitals reviewed.    ED Treatments / Results  Labs (all labs ordered are listed, but only abnormal results are displayed) Labs Reviewed  COMPREHENSIVE METABOLIC PANEL - Abnormal; Notable for the following:       Result Value   ALT 66 (*)    All other components within normal limits  LIPASE, BLOOD  CBC  URINALYSIS, ROUTINE W REFLEX MICROSCOPIC  I-STAT TROPOININ, ED    EKG  EKG Interpretation  Date/Time:  Friday May 14 2016 10:38:33 EDT Ventricular Rate:  100 PR Interval:    QRS Duration: 84 QT Interval:  330 QTC Calculation: 426 R Axis:   78 Text Interpretation:  Sinus tachycardia Borderline prolonged PR interval Probable left atrial enlargement ST elev, probable normal early repol pattern No significant change since last tracing Confirmed by [facility]  MD, Alphonzo Lemmings ([id number]) on 05/14/2016 12:21:17 PM       Radiology Dg Chest 2 View  Result Date: 05/14/2016 CLINICAL DATA:  22 year old male with a history of chest pain and nausea EXAM: CHEST  2 VIEW COMPARISON:  10/05/2015 FINDINGS: Cardiomediastinal silhouette within normal limits. No evidence of central vascular congestion. No pneumothorax or pleural effusion.  No confluent airspace disease. No displaced fracture. IMPRESSION: No radiographic evidence of acute cardiopulmonary disease. Electronically Signed   By: Gilmer Mor D.O.   On: 05/14/2016 12:26    Procedures Procedures (including critical care time)  Medications Ordered in ED Medications  ondansetron (ZOFRAN) injection 4 mg (4 mg Intravenous Given 05/14/16 1402)  sodium chloride 0.9 % bolus 1,000 mL (0 mLs Intravenous Stopped 05/14/16 1515)     Initial Impression / Assessment and Plan / ED Course  I have reviewed the triage vital signs and the nursing notes.  Pertinent labs & imaging results that were available during my  care of the patient were reviewed by me and considered in my medical decision making (see chart for details).     Pt with symptoms most consistent with a viral process with vomitting/diarrhea.  Denies bad food exposure and recent travel out of the country.  No recent abx.  No hx concerning for GU pathology or kidney stones.  Pt is awake and alert on exam without peritoneal signs and only minimal reproducible pain.  Labs without acute findings and UA normal.  Pt given IVF and zofran.   Feeling better and will d/c home.    Final Clinical Impressions(s) / ED Diagnoses   Final diagnoses:  Dehydration  Nausea vomiting and diarrhea    New Prescriptions New Prescriptions   ONDANSETRON (ZOFRAN) 4 MG TABLET    Take 1 tablet (4 mg total) by mouth  every 6 (six) hours.     Gwyneth SproutWhitney Mccrae Speciale, MD 05/14/16 [phone]

## 2016-05-14 NOTE — ED Triage Notes (Signed)
Pt reports emesis and diarrhea for the past 2 days. Unable to keep fluids down. Began to have CP after emesis.

## 2016-05-14 NOTE — ED Notes (Signed)
Pt reports mid abd pain with n/v/d x 3 days.  Had diarrhea and vomiting x 1 today, was able to eat and drink without vomiting.

## 2016-06-04 ENCOUNTER — Emergency Department (HOSPITAL_COMMUNITY): Payer: 59

## 2016-06-04 ENCOUNTER — Emergency Department (HOSPITAL_COMMUNITY)
Admission: EM | Admit: 2016-06-04 | Discharge: 2016-06-04 | Disposition: A | Discharge status: Home / Self Care | Payer: 59 | Attending: Emergency Medicine | Admitting: Emergency Medicine

## 2016-06-04 ENCOUNTER — Encounter (HOSPITAL_COMMUNITY): Payer: Self-pay | Admitting: Emergency Medicine

## 2016-06-04 DIAGNOSIS — M75102 Unspecified rotator cuff tear or rupture of left shoulder, not specified as traumatic: Secondary | ICD-10-CM | POA: Insufficient documentation

## 2016-06-04 DIAGNOSIS — M67912 Unspecified disorder of synovium and tendon, left shoulder: Secondary | ICD-10-CM

## 2016-06-04 DIAGNOSIS — Z79899 Other long term (current) drug therapy: Secondary | ICD-10-CM | POA: Insufficient documentation

## 2016-06-04 DIAGNOSIS — J45909 Unspecified asthma, uncomplicated: Secondary | ICD-10-CM | POA: Insufficient documentation

## 2016-06-04 DIAGNOSIS — F17210 Nicotine dependence, cigarettes, uncomplicated: Secondary | ICD-10-CM | POA: Diagnosis not present

## 2016-06-04 DIAGNOSIS — R079 Chest pain, unspecified: Secondary | ICD-10-CM | POA: Diagnosis not present

## 2016-06-04 LAB — CBC
HCT: 46.4 % (ref 39.0–52.0)
HEMOGLOBIN: 15.7 g/dL (ref 13.0–17.0)
MCH: 29.1 pg (ref 26.0–34.0)
MCHC: 33.8 g/dL (ref 30.0–36.0)
MCV: 85.9 fL (ref 78.0–100.0)
Platelets: 176 10*3/uL (ref 150–400)
RBC: 5.4 MIL/uL (ref 4.22–5.81)
RDW: 14.3 % (ref 11.5–15.5)
WBC: 8.2 10*3/uL (ref 4.0–10.5)

## 2016-06-04 LAB — RAPID URINE DRUG SCREEN, HOSP PERFORMED
Amphetamines: NOT DETECTED
BARBITURATES: NOT DETECTED
BENZODIAZEPINES: NOT DETECTED
Cocaine: NOT DETECTED
OPIATES: NOT DETECTED
Tetrahydrocannabinol: NOT DETECTED

## 2016-06-04 LAB — BASIC METABOLIC PANEL
Anion gap: 8 (ref 5–15)
BUN: 15 mg/dL (ref 6–20)
CALCIUM: 9.9 mg/dL (ref 8.9–10.3)
CO2: 27 mmol/L (ref 22–32)
CREATININE: 1.18 mg/dL (ref 0.61–1.24)
Chloride: 103 mmol/L (ref 101–111)
GFR calc Af Amer: >60 mL/min (ref >60)
GLUCOSE: 95 mg/dL (ref 65–99)
Potassium: 4.5 mmol/L (ref 3.5–5.1)
Sodium: 138 mmol/L (ref 135–145)

## 2016-06-04 LAB — POCT I-STAT TROPONIN I: Troponin i, poc: 0 ng/mL (ref 0.00–0.08)

## 2016-06-04 MED ORDER — NAPROXEN 500 MG PO TABS: 500.0000 mg | ORAL_TABLET | Freq: Two times a day (BID) | ORAL | 0 refills | Status: DC

## 2016-06-04 NOTE — Discharge Instructions (Signed)
As discussed, use the [facility] protocol instruction given and the summary. Follow-up with orthopedics. Naproxen for pain and swelling.  Return to the emergency department if you experience difficulty breathing, loss of sensation or strength in the left arm, worsening chest pain or pressure or any other new concerning symptoms in the meantime.

## 2016-06-04 NOTE — ED Provider Notes (Signed)
Complains of left anterior chest pain, onset 7 AM today "numbness in his which travels up and down his left arm. Pain is not made worse with exertion it is made worse with moving his left arm or with pressing on his left chest. He does admit to mild shortness of breath. Pain is pleuritic in nature. On exam alert no distress lungs clear auscultation heart regular rate and rhythm without murmurs or rubs bilateral radial pulse 2+.. Moves all extremity as well. Only cardiac risk factors smoking. I counseled patient for 5 minutes on smoking cessation Chest x-ray viewed by me. Heart score equals 1   Doug SouJacubowitz, Giulliana Mcroberts, MD 06/04/16 [phone]

## 2016-06-04 NOTE — ED Provider Notes (Signed)
[facility] DEPT Provider Note   CSN: [id number] Arrival date & time: 06/04/16  1723     History   Chief Complaint Chief Complaint  Patient presents with  . Chest Pain    HPI Warren Mcbride is a 21 y.o. male presenting with a sudden onset nonradiating left-sided chest pain upon awakening this morning around 11 AM. He went to work and started feeling some numbness in his fourth and fifth finger on the left and decided to come in. He also endorses some mild dry cough and shortness of breath since this morning. He has tried aspirin with mild relief. He denies diaphoresis, nausea, vomiting, or other symptoms. He denies cocaine use.  HPI  Past Medical History:  Diagnosis Date  . ADHD (attention deficit hyperactivity disorder)   . Anxiety   . Asthma   . Depression   . 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 Johney FrameAllred - no PPM indicated at the time; AV block due to increased vagal tone     Patient Active Problem List   Diagnosis Date Noted  . MDD (major depressive disorder), recurrent severe, without psychosis (HCC) 09/10/2015  . Depression 05/10/2015  . Bipolar I disorder, most recent episode depressed (HCC) 05/08/2015  . Polysubstance dependence including opioid type drug, episodic abuse (HCC) 05/08/2015  . Overdose of benzodiazepine   . Sinus pause   . Overdose 05/07/2015  . Tachypnea 05/07/2015  . History of asthma 05/07/2015  . Blurred vision 05/07/2015  . First degree AV block 05/07/2015  . Syncope 05/18/2013    Past Surgical History:  Procedure Laterality Date  . NO PAST SURGERIES         Home Medications    Prior to Admission medications   Medication Sig Start Date End Date Taking? Authorizing Provider  amphetamine-dextroamphetamine (ADDERALL XR) 30 MG 24 hr capsule Take 60 mg by mouth daily.   Yes [provider]  lamoTRIgine (LAMICTAL) 100 MG tablet Take 100 mg by mouth daily.   Yes [provider]  naproxen (NAPROSYN)  500 MG tablet Take 1 tablet (500 mg total) by mouth 2 (two) times daily with a meal. 06/04/16   Mathews RobinsonsMitchell, Myranda Pavone B, PA-C  omeprazole (PRILOSEC) 20 MG capsule Take 1 capsule (20 mg total) by mouth daily. Patient not taking: Reported on 06/04/2016 04/05/16   Molpus, Jonny RuizJohn, MD  ondansetron (ZOFRAN ODT) 8 MG disintegrating tablet Take 1 tablet (8 mg total) by mouth every 8 (eight) hours as needed for nausea or vomiting. Patient not taking: Reported on 05/14/2016 04/05/16   Molpus, Jonny RuizJohn, MD  ondansetron (ZOFRAN) 4 MG tablet Take 1 tablet (4 mg total) by mouth every 6 (six) hours. Patient not taking: Reported on 06/04/2016 05/14/16   Gwyneth SproutPlunkett, Whitney, MD    Family History Family History  Problem Relation Age of Onset  . Hypertension Father     Social History Social History  Substance Use Topics  . Smoking status: Current Every Day Smoker    Packs/day: 0.50    Years: 4.00    Types: Cigarettes  . Smokeless tobacco: Never Used  . Alcohol use Yes     Comment: occasional      Allergies   Patient has no known allergies.   Review of Systems Review of Systems  Constitutional: Negative for chills and fever.  HENT: Negative for ear pain and sore throat.   Eyes: Negative for pain and visual disturbance.  Respiratory: Negative for cough, choking, chest tightness, shortness of  breath, wheezing and stridor.   Cardiovascular: Positive for chest pain. Negative for palpitations and leg swelling.  Gastrointestinal: Negative for abdominal distention, abdominal pain, diarrhea, nausea and vomiting.  Genitourinary: Negative for dysuria and hematuria.  Musculoskeletal: Positive for arthralgias and myalgias. Negative for back pain, joint swelling, neck pain and neck stiffness.       Left shoulder  Skin: Negative for color change, pallor and rash.  Neurological: Positive for numbness. Negative for seizures, syncope, weakness, light-headedness and headaches.     Physical Exam Updated Vital Signs BP 127/71  (BP Location: Left Arm)   Pulse 90   Temp 98.2 F (36.8 C)   Resp 16   Ht 5\' 11"  (1.803 m)   Wt 124.7 kg (275 lb)   SpO2 98%   BMI 38.35 kg/m   Physical Exam  Constitutional: He appears well-developed and well-nourished. No distress.  Afebrile, nontoxic-appearing, sitting comfortably in bed in no acute distress.  HENT:  Head: Normocephalic and atraumatic.  Eyes: Conjunctivae and EOM are normal.  Neck: Neck supple.  Cardiovascular: Normal rate, regular rhythm, normal heart sounds and intact distal pulses.   No murmur heard. Pulmonary/Chest: Effort normal and breath sounds normal. No respiratory distress. He has no wheezes. He has no rales. He exhibits tenderness.  Over left pectoral muscle and left sternal border  Abdominal: He exhibits no distension.  Musculoskeletal: Normal range of motion. He exhibits tenderness and deformity. He exhibits no edema.  Full range of motion of the shoulder. 5 out of 5 grips bilaterally.tenderness palpation of the left pectoral muscle and left shoulder. Positive empty can test and Hawkins test. Reports decreased sensation to light touch medial aspect of the left upper extremity and left hand.  Neurological: He is alert. A sensory deficit is present.  Skin: Skin is warm and dry. No rash noted. He is not diaphoretic. No erythema. No pallor.  Psychiatric: He has a normal mood and affect.  Nursing note and vitals reviewed.    ED Treatments / Results  Labs (all labs ordered are listed, but only abnormal results are displayed) Labs Reviewed  BASIC METABOLIC PANEL  CBC  RAPID URINE DRUG SCREEN, HOSP PERFORMED  I-STAT TROPOININ, ED  POCT I-STAT TROPONIN I    EKG  EKG Interpretation  Date/Time:  Friday Jun 04 2016 17:28:35 EDT Ventricular Rate:  81 PR Interval:    QRS Duration: 93 QT Interval:  343 QTC Calculation: 399 R Axis:   74 Text Interpretation:  Sinus rhythm Atrial premature complex ST elev, probable normal early repol pattern Since  last tracing rate slower Confirmed by Doug Sou [phone]) on 06/04/2016 5:32:35 PM       Radiology Dg Chest 2 View  Result Date: 06/04/2016 CLINICAL DATA:  Chest pain beginning earlier today, history asthma, smoking EXAM: CHEST  2 VIEW COMPARISON:  05/14/2016 FINDINGS: Normal heart size, mediastinal contours, and pulmonary vascularity. Lungs clear. No pleural effusion or pneumothorax. Mild central peribronchial thickening noted. Bones unremarkable. IMPRESSION: Mild bronchitic changes without acute infiltrate. Electronically Signed   By: Ulyses Southward M.D.   On: 06/04/2016 17:47    Procedures Procedures (including critical care time)  Medications Ordered in ED Medications - No data to display   Initial Impression / Assessment and Plan / ED Course  I have reviewed the triage vital signs and the nursing notes.  Pertinent labs & imaging results that were available during my care of the patient were reviewed by me and considered in my medical decision making (see  chart for details).    Patient presents with sudden onset left-sided chest discomfort upon awakening this morning with subsequent numbness in the ulnar distribution of the left arm. On exam patient has positive Hawkins and empty can test consistent with a rotator Cuff tear/injury. Tenderness over the left shoulder and left pectoral muscle.  PERC negative, low suspicion for PE in this patient. All labs are normal, no cocaine detected. EKG and chest x-ray unremarkable.  Discharge home with [facility] protocol and close follow-up with orthopedics for rotator cuff injury.  Discussed strict return precautions and advised to return to the emergency department if experiencing any new or worsening symptoms. Instructions were understood and patient agreed with discharge plan. Final Clinical Impressions(s) / ED Diagnoses   Final diagnoses:  Nonspecific chest pain  Disorder of left rotator cuff    New Prescriptions New Prescriptions    NAPROXEN (NAPROSYN) 500 MG TABLET    Take 1 tablet (500 mg total) by mouth 2 (two) times daily with a meal.     Georgiana Shore, PA-C 06/04/16 1900    Doug Sou, MD 06/05/16 [phone]

## 2016-06-04 NOTE — ED Triage Notes (Signed)
Patient c/o left sided chest pain with dizziness that started earlier today. Patient reports that when he got to work around 3p he started having left arm numbness that is intermittent.

## 2016-07-29 ENCOUNTER — Emergency Department (HOSPITAL_COMMUNITY)
Admission: EM | Admit: 2016-07-29 | Discharge: 2016-07-29 | Discharge status: Against Medical Advice | Payer: No Typology Code available for payment source

## 2016-07-29 NOTE — ED Notes (Signed)
Called no answer from lobby

## 2016-08-28 ENCOUNTER — Other Ambulatory Visit: Payer: Self-pay

## 2016-08-28 ENCOUNTER — Emergency Department (HOSPITAL_COMMUNITY): Payer: 59

## 2016-08-28 ENCOUNTER — Encounter (HOSPITAL_COMMUNITY): Payer: Self-pay

## 2016-08-28 ENCOUNTER — Emergency Department (HOSPITAL_COMMUNITY)
Admission: EM | Admit: 2016-08-28 | Discharge: 2016-08-28 | Disposition: A | Discharge status: Home / Self Care | Payer: 59 | Attending: Emergency Medicine | Admitting: Emergency Medicine

## 2016-08-28 DIAGNOSIS — F909 Attention-deficit hyperactivity disorder, unspecified type: Secondary | ICD-10-CM | POA: Diagnosis not present

## 2016-08-28 DIAGNOSIS — J4541 Moderate persistent asthma with (acute) exacerbation: Secondary | ICD-10-CM | POA: Diagnosis not present

## 2016-08-28 DIAGNOSIS — F17210 Nicotine dependence, cigarettes, uncomplicated: Secondary | ICD-10-CM | POA: Diagnosis not present

## 2016-08-28 DIAGNOSIS — Z79899 Other long term (current) drug therapy: Secondary | ICD-10-CM | POA: Insufficient documentation

## 2016-08-28 DIAGNOSIS — R05 Cough: Secondary | ICD-10-CM | POA: Diagnosis present

## 2016-08-28 MED ORDER — PREDNISONE 10 MG PO TABS: 20.0000 mg | ORAL_TABLET | Freq: Two times a day (BID) | ORAL | 0 refills | Status: DC

## 2016-08-28 MED ORDER — ALBUTEROL SULFATE (2.5 MG/3ML) 0.083% IN NEBU
5.0000 mg | INHALATION_SOLUTION | Freq: Once | RESPIRATORY_TRACT | Status: AC
  Administered 2016-08-28: 5 mg via RESPIRATORY_TRACT
  Filled 2016-08-28: qty 6

## 2016-08-28 MED ORDER — AZITHROMYCIN 250 MG PO TABS: 250.0000 mg | ORAL_TABLET | Freq: Every day | ORAL | 0 refills | Status: DC

## 2016-08-28 MED ORDER — ALBUTEROL SULFATE (2.5 MG/3ML) 0.083% IN NEBU: 2.5000 mg | INHALATION_SOLUTION | RESPIRATORY_TRACT | 1 refills | Status: AC | PRN

## 2016-08-28 NOTE — Discharge Instructions (Signed)
Zithromax and prednisone as prescribed.  Continue your albuterol nebulizer treatments every 4 hours as needed for wheezing.  Return to the emergency department if you develop chest pain, worsening breathing, or other new and concerning symptoms.

## 2016-08-28 NOTE — ED Triage Notes (Signed)
Pt reports cough and congestion since last Monday. He states that he started wheezing today and has been unable to control it with his home meds. Reports fevers, chills, and body aches at home. Pt also states that he cannot keep any "greasy" food down, but is able to keep down liquids. A&Ox4. Ambulatory.

## 2016-08-28 NOTE — ED Provider Notes (Signed)
[facility] DEPT Provider Note   CSN: [id number] Arrival date & time: 08/28/16  1348     History   Chief Complaint Chief Complaint  Patient presents with  . Cough  . Asthma    HPI Warren Mcbride is a 21 y.o. male.  Patient is a 21 year old male with past medical history of asthma presenting with complaints of wheezing, cough, shortness of breath. This has been ongoing for the past week. He reports cough productive of sputum and difficulty keeping solids and liquids down. He has been using his inhaler and nebulizer, however with little relief.   The history is provided by the patient.  Cough  This is a recurrent problem. Episode onset: one week ago. The problem occurs constantly. The problem has been gradually worsening. The cough is productive of sputum. There has been no fever. Associated symptoms include shortness of breath and wheezing. Pertinent negatives include no chills. He is not a smoker. His past medical history is significant for asthma.  Asthma  Associated symptoms include shortness of breath.    Past Medical History:  Diagnosis Date  . ADHD (attention deficit hyperactivity disorder)   . Anxiety   . Asthma   . Depression   . 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 Johney Frame - no PPM indicated at the time; AV block due to increased vagal tone     Patient Active Problem List   Diagnosis Date Noted  . MDD (major depressive disorder), recurrent severe, without psychosis (HCC) 09/10/2015  . Depression 05/10/2015  . Bipolar I disorder, most recent episode depressed (HCC) 05/08/2015  . Polysubstance dependence including opioid type drug, episodic abuse (HCC) 05/08/2015  . Overdose of benzodiazepine   . Sinus pause   . Overdose 05/07/2015  . Tachypnea 05/07/2015  . History of asthma 05/07/2015  . Blurred vision 05/07/2015  . First degree AV block 05/07/2015  . Syncope 05/18/2013    Past Surgical History:  Procedure Laterality Date  . NO PAST  SURGERIES         Home Medications    Prior to Admission medications   Medication Sig Start Date End Date Taking? Authorizing Provider  amphetamine-dextroamphetamine (ADDERALL XR) 30 MG 24 hr capsule Take 60 mg by mouth daily.    [provider]  lamoTRIgine (LAMICTAL) 100 MG tablet Take 100 mg by mouth daily.    [provider]  naproxen (NAPROSYN) 500 MG tablet Take 1 tablet (500 mg total) by mouth 2 (two) times daily with a meal. 06/04/16   Mathews Robinsons B, PA-C  omeprazole (PRILOSEC) 20 MG capsule Take 1 capsule (20 mg total) by mouth daily. Patient not taking: Reported on 06/04/2016 04/05/16   Molpus, Jonny Ruiz, MD  ondansetron (ZOFRAN ODT) 8 MG disintegrating tablet Take 1 tablet (8 mg total) by mouth every 8 (eight) hours as needed for nausea or vomiting. Patient not taking: Reported on 05/14/2016 04/05/16   Molpus, Jonny Ruiz, MD  ondansetron (ZOFRAN) 4 MG tablet Take 1 tablet (4 mg total) by mouth every 6 (six) hours. Patient not taking: Reported on 06/04/2016 05/14/16   Gwyneth Sprout, MD    Family History Family History  Problem Relation Age of Onset  . Hypertension Father     Social History Social History  Substance Use Topics  . Smoking status: Current Every Day Smoker    Packs/day: 0.50    Years: 4.00    Types: Cigarettes  . Smokeless tobacco: Never Used  . Alcohol use Yes  Comment: occasional      Allergies   Patient has no known allergies.   Review of Systems Review of Systems  Constitutional: Negative for chills.  Respiratory: Positive for cough, shortness of breath and wheezing.   All other systems reviewed and are negative.    Physical Exam Updated Vital Signs BP 120/62 (BP Location: Left Arm)   Pulse 86   Temp 97.9 F (36.6 C) (Oral)   Resp (!) 22   SpO2 94%   Physical Exam  Constitutional: He is oriented to person, place, and time. He appears well-developed and well-nourished. No distress.  HENT:  Head: Normocephalic and  atraumatic.  Mouth/Throat: Oropharynx is clear and moist.  Neck: Normal range of motion. Neck supple.  Cardiovascular: Normal rate and regular rhythm.  Exam reveals no friction rub.   No murmur heard. Pulmonary/Chest: Effort normal. No respiratory distress. He has wheezes. He has no rales.  There are slight expiratory wheezes bilaterally.  Abdominal: Soft. Bowel sounds are normal. He exhibits no distension. There is no tenderness.  Musculoskeletal: Normal range of motion. He exhibits no edema.  Neurological: He is alert and oriented to person, place, and time. Coordination normal.  Skin: Skin is warm and dry. He is not diaphoretic.  Nursing note and vitals reviewed.    ED Treatments / Results  Labs (all labs ordered are listed, but only abnormal results are displayed) Labs Reviewed - No data to display  EKG  EKG Interpretation  Date/Time:  Saturday August 28 2016 15:18:08 EDT Ventricular Rate:  84 PR Interval:    QRS Duration: 82 QT Interval:  358 QTC Calculation: 424 R Axis:   88 Text Interpretation:  Sinus rhythm ST elev, probable normal early repol pattern No significant change since last tracing Confirmed by Linwood Dibbles [phone]) on 08/28/2016 3:29:57 PM       Radiology Dg Chest 2 View  Result Date: 08/28/2016 CLINICAL DATA:  Shortness of breath and cough. EXAM: CHEST  2 VIEW COMPARISON:  Jun 04, 2016 FINDINGS: The heart size and mediastinal contours are within normal limits. Both lungs are clear. The visualized skeletal structures are unremarkable. IMPRESSION: No active cardiopulmonary disease. Electronically Signed   By: Gerome Sam III M.D   On: 08/28/2016 15:36    Procedures Procedures (including critical care time)  Medications Ordered in ED Medications  albuterol (PROVENTIL) (2.5 MG/3ML) 0.083% nebulizer solution 5 mg (5 mg Nebulization Given 08/28/16 1510)     Initial Impression / Assessment and Plan / ED Course  I have reviewed the triage vital signs and  the nursing notes.  Pertinent labs & imaging results that were available during my care of the patient were reviewed by me and considered in my medical decision making (see chart for details).  Patient feeling better after an albuterol nebulizer. His chest x-ray reveals no acute process. He will be treated for an asthma exacerbation with prednisone and Zithromax. He will also be given a refill of his albuterol.  There is no hypoxia or respiratory distress and I believe he is appropriate for discharge. To return as needed for any problems.  Final Clinical Impressions(s) / ED Diagnoses   Final diagnoses:  None    New Prescriptions New Prescriptions   No medications on file     Geoffery Lyons, MD 08/28/16 1705

## 2017-02-25 ENCOUNTER — Other Ambulatory Visit: Payer: Self-pay

## 2017-02-25 ENCOUNTER — Encounter (HOSPITAL_COMMUNITY): Payer: Self-pay | Admitting: *Deleted

## 2017-02-25 ENCOUNTER — Emergency Department (HOSPITAL_COMMUNITY)
Admission: EM | Admit: 2017-02-25 | Discharge: 2017-02-25 | Disposition: A | Discharge status: Home / Self Care | Payer: 59 | Attending: Emergency Medicine | Admitting: Emergency Medicine

## 2017-02-25 DIAGNOSIS — F17210 Nicotine dependence, cigarettes, uncomplicated: Secondary | ICD-10-CM | POA: Insufficient documentation

## 2017-02-25 DIAGNOSIS — R1084 Generalized abdominal pain: Secondary | ICD-10-CM | POA: Insufficient documentation

## 2017-02-25 DIAGNOSIS — R197 Diarrhea, unspecified: Secondary | ICD-10-CM | POA: Insufficient documentation

## 2017-02-25 DIAGNOSIS — J45909 Unspecified asthma, uncomplicated: Secondary | ICD-10-CM | POA: Insufficient documentation

## 2017-02-25 DIAGNOSIS — Z79899 Other long term (current) drug therapy: Secondary | ICD-10-CM | POA: Insufficient documentation

## 2017-02-25 DIAGNOSIS — R112 Nausea with vomiting, unspecified: Secondary | ICD-10-CM

## 2017-02-25 LAB — COMPREHENSIVE METABOLIC PANEL
ALK PHOS: 87 U/L (ref 38–126)
ALT: 25 U/L (ref 17–63)
ANION GAP: 12 (ref 5–15)
AST: 22 U/L (ref 15–41)
Albumin: 4.1 g/dL (ref 3.5–5.0)
BILIRUBIN TOTAL: 0.5 mg/dL (ref 0.3–1.2)
BUN: 8 mg/dL (ref 6–20)
CALCIUM: 9.3 mg/dL (ref 8.9–10.3)
CO2: 21 mmol/L — ABNORMAL LOW (ref 22–32)
CREATININE: 1.25 mg/dL — AB (ref 0.61–1.24)
Chloride: 106 mmol/L (ref 101–111)
GFR calc non Af Amer: >60 mL/min (ref >60)
Glucose, Bld: 87 mg/dL (ref 65–99)
Potassium: 3.6 mmol/L (ref 3.5–5.1)
Sodium: 139 mmol/L (ref 135–145)
TOTAL PROTEIN: 7.4 g/dL (ref 6.5–8.1)

## 2017-02-25 LAB — CBC
HCT: 46.3 % (ref 39.0–52.0)
Hemoglobin: 15.5 g/dL (ref 13.0–17.0)
MCH: 28.7 pg (ref 26.0–34.0)
MCHC: 33.5 g/dL (ref 30.0–36.0)
MCV: 85.7 fL (ref 78.0–100.0)
PLATELETS: 170 10*3/uL (ref 150–400)
RBC: 5.4 MIL/uL (ref 4.22–5.81)
RDW: 15.1 % (ref 11.5–15.5)
WBC: 6.1 10*3/uL (ref 4.0–10.5)

## 2017-02-25 LAB — LIPASE, BLOOD: Lipase: 26 U/L (ref 11–51)

## 2017-02-25 MED ORDER — SODIUM CHLORIDE 0.9 % IV BOLUS (SEPSIS)
1000.0000 mL | Freq: Once | INTRAVENOUS | Status: AC
  Administered 2017-02-25: 1000 mL via INTRAVENOUS

## 2017-02-25 MED ORDER — ONDANSETRON HCL 4 MG/2ML IJ SOLN
4.0000 mg | Freq: Once | INTRAMUSCULAR | Status: AC
  Administered 2017-02-25: 4 mg via INTRAVENOUS
  Filled 2017-02-25: qty 2

## 2017-02-25 MED ORDER — ONDANSETRON 4 MG PO TBDP
4.0000 mg | ORAL_TABLET | Freq: Once | ORAL | Status: AC
  Administered 2017-02-25: 4 mg via ORAL
  Filled 2017-02-25: qty 1

## 2017-02-25 MED ORDER — ONDANSETRON 4 MG PO TBDP: 4.0000 mg | ORAL_TABLET | Freq: Three times a day (TID) | ORAL | 0 refills | Status: DC | PRN

## 2017-02-25 NOTE — ED Provider Notes (Signed)
[facility] EMERGENCY DEPARTMENT Provider Note   CSN: [id number] Arrival date & time: 02/25/17  1324     History   Chief Complaint No chief complaint on file.   HPI Warren Mcbride is a 22 y.o. male.  HPI Patient presents with nausea vomiting diarrhea.  Began around 4 days ago.  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 has not had similar symptoms.  Has vomited 3 times today and diarrhea around 4 times today.  States he has some crampy abdominal pain but is not too severe.  Has had no blood in the stool.  No lightheadedness or dizziness.  States he is hungry but as soon as he eats it will come back up.  He has not taken any medicines to help with the nausea.  Pain is dull. Past Medical History:  Diagnosis Date  . ADHD (attention deficit hyperactivity disorder)   . Anxiety   . Asthma   . Depression   . 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 Johney Frame - no PPM indicated at the time; AV block due to increased vagal tone     Patient Active Problem List   Diagnosis Date Noted  . MDD (major depressive disorder), recurrent severe, without psychosis (HCC) 09/10/2015  . Depression 05/10/2015  . Bipolar I disorder, most recent episode depressed (HCC) 05/08/2015  . Polysubstance dependence including opioid type drug, episodic abuse (HCC) 05/08/2015  . Overdose of benzodiazepine   . Sinus pause   . Overdose 05/07/2015  . Tachypnea 05/07/2015  . History of asthma 05/07/2015  . Blurred vision 05/07/2015  . First degree AV block 05/07/2015  . Syncope 05/18/2013    Past Surgical History:  Procedure Laterality Date  . NO PAST SURGERIES         Home Medications    Prior to Admission medications   Medication Sig Start Date End Date Taking? Authorizing Provider  albuterol (PROVENTIL) (2.5 MG/3ML) 0.083% nebulizer solution Take 3 mLs (2.5 mg total) by nebulization every 4 (four) hours as needed for wheezing or shortness of breath. 08/28/16    Geoffery Lyons, MD  amphetamine-dextroamphetamine (ADDERALL XR) 30 MG 24 hr capsule Take 60 mg by mouth daily.    [provider]  azithromycin (ZITHROMAX) 250 MG tablet Take 1 tablet (250 mg total) by mouth daily. Take first 2 tablets together, then 1 every day until finished. 08/28/16   Geoffery Lyons, MD  lamoTRIgine (LAMICTAL) 100 MG tablet Take 100 mg by mouth daily.    [provider]  naproxen (NAPROSYN) 500 MG tablet Take 1 tablet (500 mg total) by mouth 2 (two) times daily with a meal. 06/04/16   Mathews Robinsons B, PA-C  omeprazole (PRILOSEC) 20 MG capsule Take 1 capsule (20 mg total) by mouth daily. Patient not taking: Reported on 06/04/2016 04/05/16   Molpus, Jonny Ruiz, MD  ondansetron (ZOFRAN) 4 MG tablet Take 1 tablet (4 mg total) by mouth every 6 (six) hours. Patient not taking: Reported on 06/04/2016 05/14/16   Gwyneth Sprout, MD  ondansetron (ZOFRAN-ODT) 4 MG disintegrating tablet Take 1 tablet (4 mg total) by mouth every 8 (eight) hours as needed for nausea or vomiting. 02/25/17   Benjiman Core, MD  predniSONE (DELTASONE) 10 MG tablet Take 2 tablets (20 mg total) by mouth 2 (two) times daily with a meal. 08/28/16   Geoffery Lyons, MD    Family History Family History  Problem Relation Age of Onset  . Hypertension  Father     Social History Social History   Tobacco Use  . Smoking status: Current Every Day Smoker    Packs/day: 0.50    Years: 4.00    Pack years: 2.00    Types: Cigarettes  . Smokeless tobacco: Never Used  Substance Use Topics  . Alcohol use: Yes    Comment: occasional   . Drug use: Yes    Frequency: 7.0 times per week    Types: Marijuana     Allergies   Patient has no known allergies.   Review of Systems Review of Systems  Constitutional: Positive for fatigue. Negative for appetite change.  HENT: Negative for congestion.   Respiratory: Negative for shortness of breath.   Cardiovascular: Negative for chest pain.  Gastrointestinal:  Positive for abdominal pain, diarrhea, nausea and vomiting.  Genitourinary: Negative for flank pain.  Musculoskeletal: Negative for back pain.  Neurological: Negative for seizures.  Hematological: Negative for adenopathy.  Psychiatric/Behavioral: Negative for confusion.     Physical Exam Updated Vital Signs BP 115/68   Pulse (!) 50   Temp 98.4 F (36.9 C) (Oral)   Resp 18   Ht 5\' 11"  (1.803 m)   Wt 117.9 kg (260 lb)   SpO2 99%   BMI 36.26 kg/m   Physical Exam  Constitutional: He appears well-developed.  HENT:  Head: Atraumatic.  Eyes: Pupils are equal, round, and reactive to light.  Neck: Neck supple.  Cardiovascular: Normal rate.  Pulmonary/Chest:  Few scattered wheezes  Abdominal: There is no tenderness.  Musculoskeletal: He exhibits no edema.  Neurological: He is alert.  Skin: Skin is warm. Capillary refill takes less than 2 seconds.  Psychiatric: He has a normal mood and affect.     ED Treatments / Results  Labs (all labs ordered are listed, but only abnormal results are displayed) Labs Reviewed  COMPREHENSIVE METABOLIC PANEL - Abnormal; Notable for the following components:      Result Value   CO2 21 (*)    Creatinine, Ser 1.25 (*)    All other components within normal limits  LIPASE, BLOOD  CBC    EKG  EKG Interpretation None       Radiology No results found.  Procedures Procedures (including critical care time)  Medications Ordered in ED Medications  ondansetron (ZOFRAN-ODT) disintegrating tablet 4 mg (4 mg Oral Given 02/25/17 0958)  sodium chloride 0.9 % bolus 1,000 mL (0 mLs Intravenous Stopped 02/25/17 1432)  ondansetron (ZOFRAN) injection 4 mg (4 mg Intravenous Given 02/25/17 1241)     Initial Impression / Assessment and Plan / ED Course  I have reviewed the triage vital signs and the nursing notes.  Pertinent labs & imaging results that were available during my care of the patient were reviewed by me and considered in my medical  decision making (see chart for details).     Patient with nausea vomiting diarrhea.  Feels better after treatment.  Tolerate orals.  Well-appearing.  Discharged home.  Final Clinical Impressions(s) / ED Diagnoses   Final diagnoses:  Nausea vomiting and diarrhea    ED Discharge Orders        Ordered    ondansetron (ZOFRAN-ODT) 4 MG disintegrating tablet  Every 8 hours PRN     02/25/17 1441       Benjiman CorePickering, Damaree Sargent, MD 02/25/17 2232

## 2017-02-25 NOTE — ED Notes (Signed)
Pt provided with ginger ale 

## 2017-02-25 NOTE — ED Triage Notes (Signed)
Pt in c/o abd pain, n/v/d, onset  x4 days, reports x 4 loose stools today, and x 3 emesis today, pt A&O x4

## 2017-02-25 NOTE — ED Notes (Signed)
ED Provider at bedside. 

## 2017-03-22 ENCOUNTER — Other Ambulatory Visit: Payer: Self-pay

## 2017-03-22 ENCOUNTER — Encounter (HOSPITAL_COMMUNITY): Payer: Self-pay

## 2017-03-22 ENCOUNTER — Emergency Department (HOSPITAL_COMMUNITY)
Admission: EM | Admit: 2017-03-22 | Discharge: 2017-03-22 | Disposition: A | Discharge status: Home / Self Care | Payer: Self-pay | Attending: Emergency Medicine | Admitting: Emergency Medicine

## 2017-03-22 DIAGNOSIS — R112 Nausea with vomiting, unspecified: Secondary | ICD-10-CM | POA: Insufficient documentation

## 2017-03-22 DIAGNOSIS — Z5321 Procedure and treatment not carried out due to patient leaving prior to being seen by health care provider: Secondary | ICD-10-CM | POA: Insufficient documentation

## 2017-03-22 LAB — URINALYSIS, ROUTINE W REFLEX MICROSCOPIC
BACTERIA UA: NONE SEEN
Glucose, UA: NEGATIVE mg/dL
HGB URINE DIPSTICK: NEGATIVE
Ketones, ur: 80 mg/dL — AB
Leukocytes, UA: NEGATIVE
NITRITE: NEGATIVE
PROTEIN: 100 mg/dL — AB
SPECIFIC GRAVITY, URINE: 1.039 — AB (ref 1.005–1.030)
Squamous Epithelial / LPF: NONE SEEN
pH: 6 (ref 5.0–8.0)

## 2017-03-22 LAB — CBC
HCT: 49.9 % (ref 39.0–52.0)
HEMOGLOBIN: 17.5 g/dL — AB (ref 13.0–17.0)
MCH: 30.6 pg (ref 26.0–34.0)
MCHC: 35.1 g/dL (ref 30.0–36.0)
MCV: 87.2 fL (ref 78.0–100.0)
Platelets: 165 10*3/uL (ref 150–400)
RBC: 5.72 MIL/uL (ref 4.22–5.81)
RDW: 15.5 % (ref 11.5–15.5)
WBC: 6.9 10*3/uL (ref 4.0–10.5)

## 2017-03-22 LAB — COMPREHENSIVE METABOLIC PANEL
ALT: 31 U/L (ref 17–63)
AST: 36 U/L (ref 15–41)
Albumin: 5 g/dL (ref 3.5–5.0)
Alkaline Phosphatase: 94 U/L (ref 38–126)
Anion gap: 14 (ref 5–15)
BUN: 12 mg/dL (ref 6–20)
CHLORIDE: 101 mmol/L (ref 101–111)
CO2: 22 mmol/L (ref 22–32)
Calcium: 10.1 mg/dL (ref 8.9–10.3)
Creatinine, Ser: 1.24 mg/dL (ref 0.61–1.24)
GFR calc Af Amer: >60 mL/min (ref >60)
Glucose, Bld: 113 mg/dL — ABNORMAL HIGH (ref 65–99)
POTASSIUM: 3.8 mmol/L (ref 3.5–5.1)
Sodium: 137 mmol/L (ref 135–145)
Total Bilirubin: 1.3 mg/dL — ABNORMAL HIGH (ref 0.3–1.2)
Total Protein: 8.6 g/dL — ABNORMAL HIGH (ref 6.5–8.1)

## 2017-03-22 LAB — LIPASE, BLOOD: LIPASE: 20 U/L (ref 11–51)

## 2017-03-22 MED ORDER — ONDANSETRON 4 MG PO TBDP
4.0000 mg | ORAL_TABLET | Freq: Once | ORAL | Status: AC | PRN
  Administered 2017-03-22: 4 mg via ORAL
  Filled 2017-03-22: qty 1

## 2017-03-22 NOTE — ED Notes (Signed)
Pt is c/o Left sided chest pain 10/10 sharp while in the waiting room v/s obtained showed  with associated abdominal pain. . Pt  Continues to episodes of vomiting  But appears yellow in color. Pt reports smoking marijuana yesterday

## 2017-03-22 NOTE — ED Notes (Signed)
Patient observed drinking water out of the sink and then vomiting.

## 2017-03-22 NOTE — ED Notes (Signed)
Patient called to be placed in room x3 with no answer.

## 2017-03-22 NOTE — ED Notes (Addendum)
Patient was called for an assigned room with no answer.  

## 2017-03-22 NOTE — ED Triage Notes (Signed)
Per EMS-states abdominal pain, N/V/D and chills since last night-was seen for same symptoms in February

## 2017-03-22 NOTE — ED Notes (Signed)
No answer when called for recheck on vital signs 

## 2017-03-23 NOTE — ED Notes (Signed)
03/23/2017, 17:00  Follow-up call completed.

## 2017-06-01 IMAGING — CR DG THORACIC SPINE 2V
3 series · 3 of 3 positions shown · non-contrast
Comparison: 12/11/2014 .

CLINICAL DATA: Neck pain.  Back pain.  Injury.

EXAM:
THORACIC SPINE 2 VIEWS

[t thoracic spine ap]
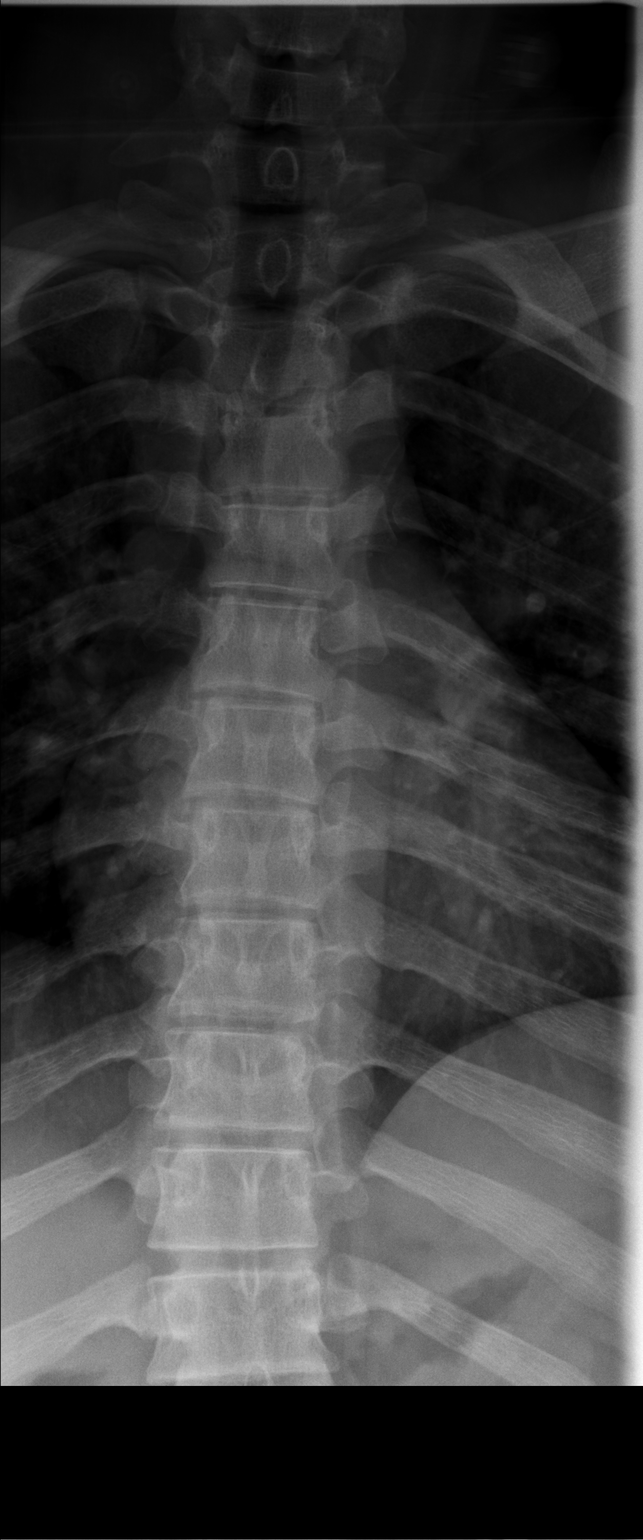

[t thoracic spine lat]
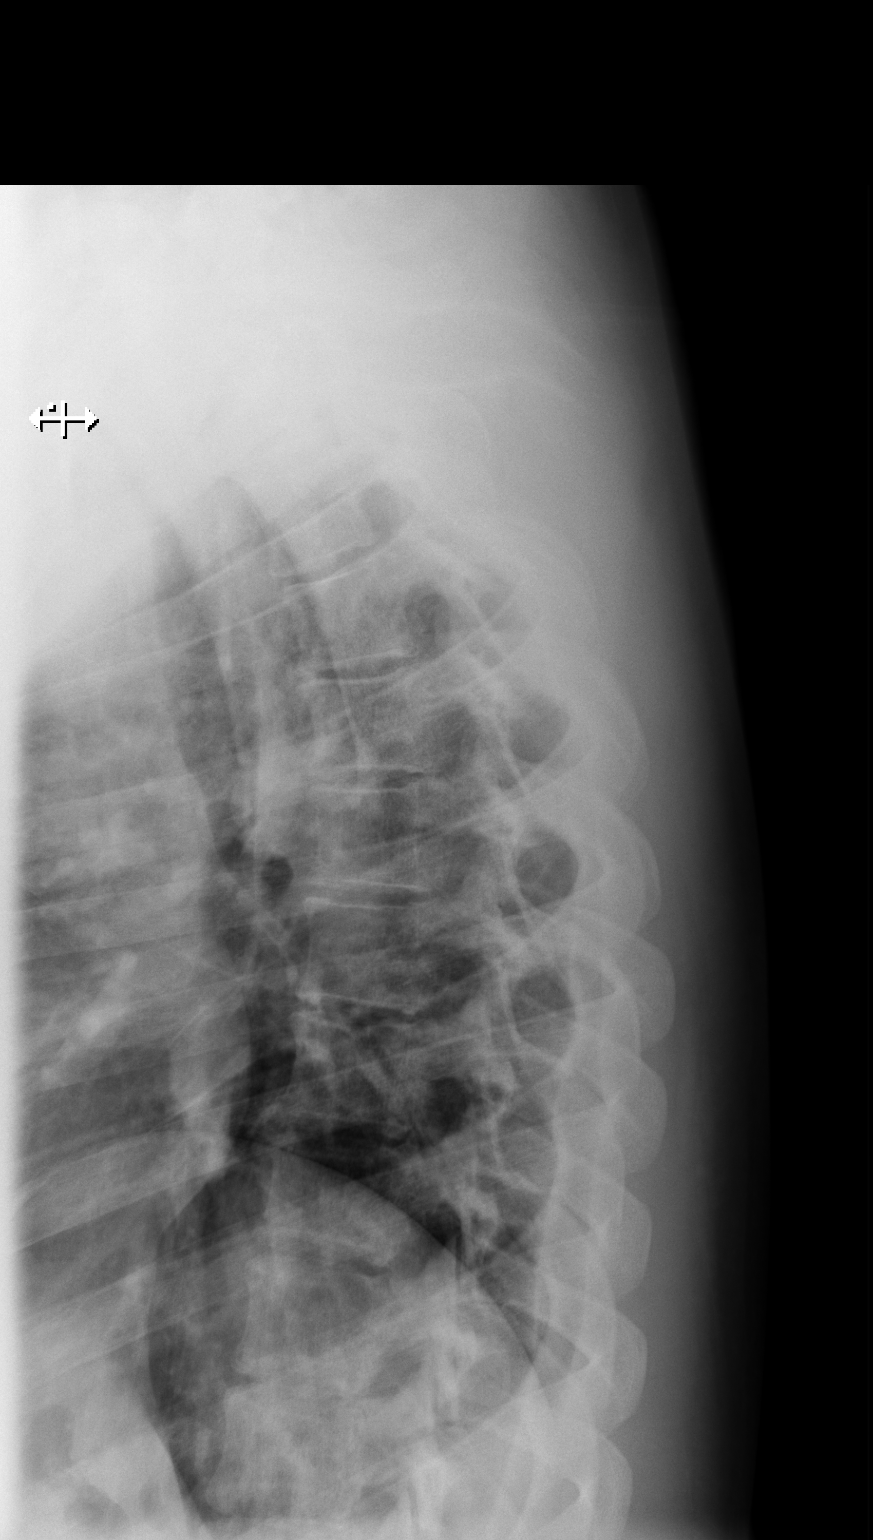

[t thoracic swimmers]
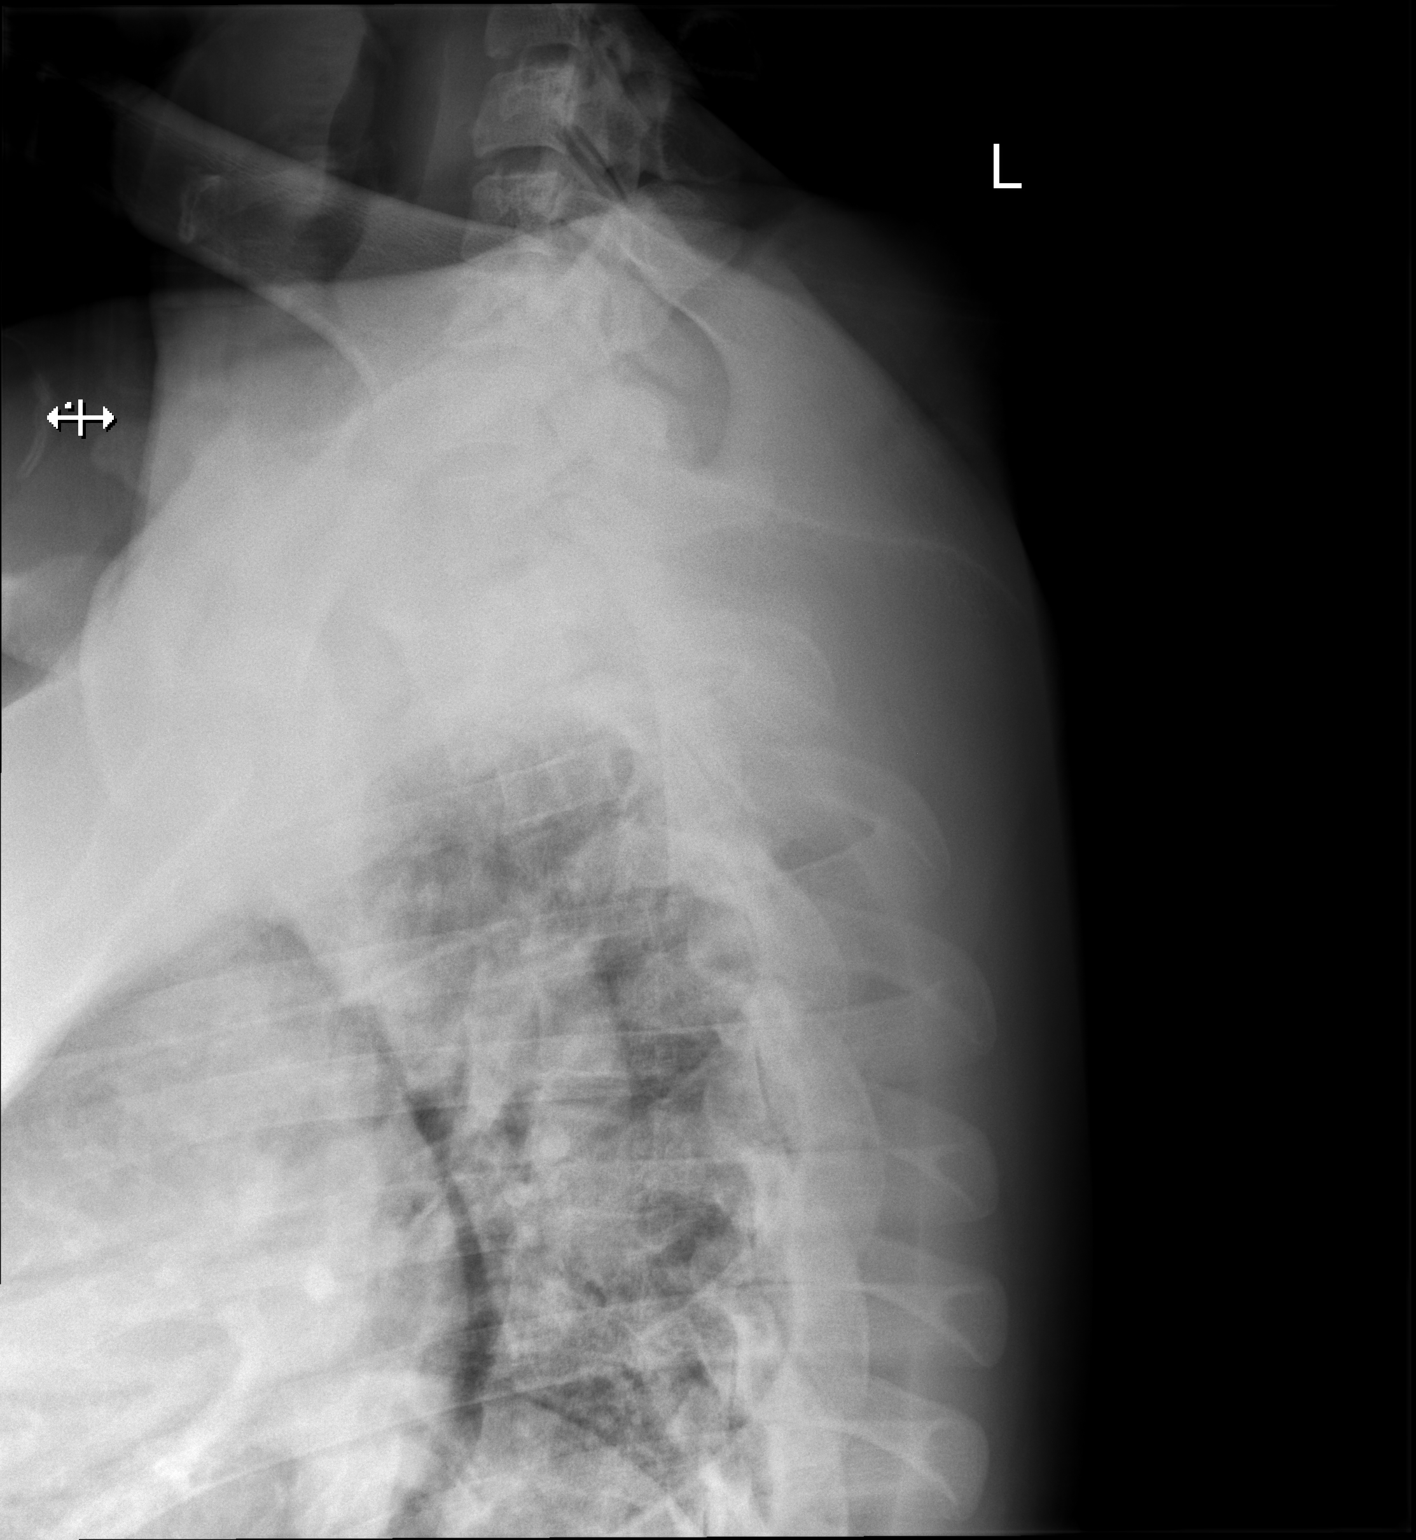

[3 of 3 positions shown; findings below may reference images not displayed]

FINDINGS: Mild scoliosis concave left. No acute bony abnormality identified.
Normal mineralization.
IMPRESSION: Mild scoliosis concave left.  No acute abnormality.

## 2017-09-09 ENCOUNTER — Emergency Department (HOSPITAL_COMMUNITY)
Admission: EM | Admit: 2017-09-09 | Discharge: 2017-09-09 | Disposition: A | Discharge status: Home / Self Care | Payer: Self-pay | Attending: Emergency Medicine | Admitting: Emergency Medicine

## 2017-09-09 ENCOUNTER — Other Ambulatory Visit: Payer: Self-pay

## 2017-09-09 DIAGNOSIS — F17210 Nicotine dependence, cigarettes, uncomplicated: Secondary | ICD-10-CM | POA: Insufficient documentation

## 2017-09-09 DIAGNOSIS — J45901 Unspecified asthma with (acute) exacerbation: Secondary | ICD-10-CM

## 2017-09-09 DIAGNOSIS — Z79899 Other long term (current) drug therapy: Secondary | ICD-10-CM | POA: Insufficient documentation

## 2017-09-09 DIAGNOSIS — J4541 Moderate persistent asthma with (acute) exacerbation: Secondary | ICD-10-CM | POA: Insufficient documentation

## 2017-09-09 MED ORDER — PREDNISONE 10 MG PO TABS: 20.0000 mg | ORAL_TABLET | Freq: Every day | ORAL | 0 refills | Status: DC

## 2017-09-09 NOTE — ED Provider Notes (Signed)
[facility]-EMERGENCY DEPT Provider Note   CSN: [id number] Arrival date & time: 09/09/17  1011     History   Chief Complaint Chief Complaint  Patient presents with  . Shortness of Breath  . Asthma    HPI Warren Mcbride is a 22 y.o. male.  HPI 22 year old male history of asthma presents today with increased wheezing and dyspnea that began last night during the night.  He has had some URI symptoms for the past 1 to 2 days.  These are consistent with some nasal congestion.  Last night he had some increased shortness of breath with wheezing which he has identified as consistent with his previous asthma.  He uses albuterol inhaler x3 this morning and continue to feel dyspneic.  EMS was called.  On arrival they gave him Solu-Medrol and albuterol nebulizers x2.  Feels somewhat improved at this time is receiving albuterol here in the ED.  Denies fever, chills, nausea, vomiting, or diarrhea.  Denies history of DVT or PE. Past Medical History:  Diagnosis Date  . ADHD (attention deficit hyperactivity disorder)   . Anxiety   . Asthma   . Depression   . 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 Johney FrameAllred - no PPM indicated at the time; AV block due to increased vagal tone     Patient Active Problem List   Diagnosis Date Noted  . MDD (major depressive disorder), recurrent severe, without psychosis (HCC) 09/10/2015  . Depression 05/10/2015  . Bipolar I disorder, most recent episode depressed (HCC) 05/08/2015  . Polysubstance dependence including opioid type drug, episodic abuse (HCC) 05/08/2015  . Overdose of benzodiazepine   . Sinus pause   . Overdose 05/07/2015  . Tachypnea 05/07/2015  . History of asthma 05/07/2015  . Blurred vision 05/07/2015  . First degree AV block 05/07/2015  . Syncope 05/18/2013    Past Surgical History:  Procedure Laterality Date  . NO PAST SURGERIES          Home Medications    Prior to Admission medications     Medication Sig Start Date End Date Taking? Authorizing Provider  albuterol (PROVENTIL) (2.5 MG/3ML) 0.083% nebulizer solution Take 3 mLs (2.5 mg total) by nebulization every 4 (four) hours as needed for wheezing or shortness of breath. 08/28/16   Geoffery Lyonselo, Douglas, MD  amphetamine-dextroamphetamine (ADDERALL XR) 30 MG 24 hr capsule Take 60 mg by mouth daily.    [provider]  azithromycin (ZITHROMAX) 250 MG tablet Take 1 tablet (250 mg total) by mouth daily. Take first 2 tablets together, then 1 every day until finished. 08/28/16   Geoffery Lyonselo, Douglas, MD  lamoTRIgine (LAMICTAL) 100 MG tablet Take 100 mg by mouth daily.    [provider]  naproxen (NAPROSYN) 500 MG tablet Take 1 tablet (500 mg total) by mouth 2 (two) times daily with a meal. 06/04/16   Mathews RobinsonsMitchell, Jessica B, PA-C  omeprazole (PRILOSEC) 20 MG capsule Take 1 capsule (20 mg total) by mouth daily. Patient not taking: Reported on 06/04/2016 04/05/16   Molpus, Jonny RuizJohn, MD  ondansetron (ZOFRAN) 4 MG tablet Take 1 tablet (4 mg total) by mouth every 6 (six) hours. Patient not taking: Reported on 06/04/2016 05/14/16   Gwyneth SproutPlunkett, Whitney, MD  ondansetron (ZOFRAN-ODT) 4 MG disintegrating tablet Take 1 tablet (4 mg total) by mouth every 8 (eight) hours as needed for nausea or vomiting. 02/25/17   Benjiman CorePickering, Nathan, MD  predniSONE (DELTASONE) 10 MG tablet Take 2 tablets (20  mg total) by mouth 2 (two) times daily with a meal. 08/28/16   Geoffery Lyons, MD    Family History Family History  Problem Relation Age of Onset  . Hypertension Father     Social History Social History   Tobacco Use  . Smoking status: Current Every Day Smoker    Packs/day: 0.50    Years: 4.00    Pack years: 2.00    Types: Cigarettes  . Smokeless tobacco: Never Used  Substance Use Topics  . Alcohol use: Yes    Comment: occasional   . Drug use: Yes    Frequency: 7.0 times per week    Types: Marijuana    Comment: occasionally     Allergies   Patient has no  known allergies.   Review of Systems Review of Systems  All other systems reviewed and are negative.    Physical Exam Updated Vital Signs Ht 1.829 m (6')   Wt 108.9 kg   BMI 32.56 kg/m   Physical Exam  Constitutional: He is oriented to person, place, and time. He appears well-developed and well-nourished. He does not appear ill. No distress.  HENT:  Head: Normocephalic and atraumatic.  Mouth/Throat: Oropharynx is clear and moist.  Eyes: Pupils are equal, round, and reactive to light. EOM are normal.  Neck: Normal range of motion. Neck supple.  Cardiovascular: Normal rate, regular rhythm and normal heart sounds.  Pulmonary/Chest: Effort normal. He has decreased breath sounds. He has no wheezes. He has no rhonchi. He has no rales.  Abdominal: Soft. Bowel sounds are normal.  Musculoskeletal:       Right lower leg: Normal. He exhibits no tenderness and no edema.       Left lower leg: Normal. He exhibits no tenderness and no edema.  Neurological: He is alert and oriented to person, place, and time.  Skin: Skin is warm and dry. Capillary refill takes less than 2 seconds.  Psychiatric: He has a normal mood and affect. His behavior is normal.  Nursing note and vitals reviewed.    ED Treatments / Results  Labs (all labs ordered are listed, but only abnormal results are displayed) Labs Reviewed - No data to display  EKG EKG Interpretation  Date/Time:  Friday September 09 2017 10:25:45 EDT Ventricular Rate:  75 PR Interval:    QRS Duration: 87 QT Interval:  368 QTC Calculation: 411 R Axis:   81 Text Interpretation:  Normal sinus rhythm with prolonged AV conduction No significant change since last tracing Confirmed by Margarita Grizzle [phone]) on 09/09/2017 10:42:38 AM   Radiology No results found.  Procedures Procedures (including critical care time)  Medications Ordered in ED Medications - No data to display   Initial Impression / Assessment and Plan / ED Course  I have  reviewed the triage vital signs and the nursing notes.  Pertinent labs & imaging results that were available during my care of the patient were reviewed by me and considered in my medical decision making (see chart for details).    12:35 PM Patient resting comfortably no wheezing noted.  Sats are normal.  Plan prednisone.  Patient reports he has albuterol at home to use.  Final Clinical Impressions(s) / ED Diagnoses   Final diagnoses:  Moderate asthma with exacerbation, unspecified whether persistent    ED Discharge Orders    None       Margarita Grizzle, MD 09/09/17 1237

## 2017-09-09 NOTE — ED Triage Notes (Signed)
Patient arrives with SOB since midnight. Hx asthma. Patient does not have inhalers. Diminished breath sounds. 1 albuterol neb, 1 duo-neb. 125mg  solumedrol.  20g IV inserted into left ac.  BP: 144/78 HR: 86 RR: 18 O2: 100

## 2017-09-09 NOTE — Discharge Instructions (Addendum)
These pick up prescription for prednisone and take as prescribed.  Continue your albuterol 2 puffs every 4 hours Return if you are worse at any time Follow-up with your primary care doctor next week

## 2017-09-09 NOTE — ED Notes (Signed)
Bed: [id number] Expected date:  Expected time:  Means of arrival:  Comments: 22 yo SOB

## 2018-01-21 ENCOUNTER — Emergency Department (HOSPITAL_COMMUNITY): Payer: Self-pay

## 2018-01-21 ENCOUNTER — Emergency Department (HOSPITAL_COMMUNITY)
Admission: EM | Admit: 2018-01-21 | Discharge: 2018-01-21 | Disposition: A | Discharge status: Home / Self Care | Payer: Self-pay | Attending: Emergency Medicine | Admitting: Emergency Medicine

## 2018-01-21 ENCOUNTER — Encounter (HOSPITAL_COMMUNITY): Payer: Self-pay | Admitting: Emergency Medicine

## 2018-01-21 DIAGNOSIS — R69 Illness, unspecified: Secondary | ICD-10-CM

## 2018-01-21 DIAGNOSIS — F17210 Nicotine dependence, cigarettes, uncomplicated: Secondary | ICD-10-CM | POA: Insufficient documentation

## 2018-01-21 DIAGNOSIS — J45909 Unspecified asthma, uncomplicated: Secondary | ICD-10-CM | POA: Insufficient documentation

## 2018-01-21 DIAGNOSIS — B9789 Other viral agents as the cause of diseases classified elsewhere: Secondary | ICD-10-CM

## 2018-01-21 DIAGNOSIS — M791 Myalgia, unspecified site: Secondary | ICD-10-CM | POA: Insufficient documentation

## 2018-01-21 DIAGNOSIS — R509 Fever, unspecified: Secondary | ICD-10-CM | POA: Insufficient documentation

## 2018-01-21 DIAGNOSIS — R05 Cough: Secondary | ICD-10-CM | POA: Insufficient documentation

## 2018-01-21 DIAGNOSIS — J029 Acute pharyngitis, unspecified: Secondary | ICD-10-CM | POA: Insufficient documentation

## 2018-01-21 DIAGNOSIS — R0989 Other specified symptoms and signs involving the circulatory and respiratory systems: Secondary | ICD-10-CM | POA: Insufficient documentation

## 2018-01-21 DIAGNOSIS — J111 Influenza due to unidentified influenza virus with other respiratory manifestations: Secondary | ICD-10-CM

## 2018-01-21 DIAGNOSIS — J069 Acute upper respiratory infection, unspecified: Secondary | ICD-10-CM

## 2018-01-21 LAB — URINALYSIS, ROUTINE W REFLEX MICROSCOPIC
Bilirubin Urine: NEGATIVE
Glucose, UA: NEGATIVE mg/dL
HGB URINE DIPSTICK: NEGATIVE
Ketones, ur: NEGATIVE mg/dL
Leukocytes, UA: NEGATIVE
NITRITE: NEGATIVE
PROTEIN: NEGATIVE mg/dL
Specific Gravity, Urine: 1.028 (ref 1.005–1.030)
pH: 8 (ref 5.0–8.0)

## 2018-01-21 LAB — COMPREHENSIVE METABOLIC PANEL
ALBUMIN: 4.6 g/dL (ref 3.5–5.0)
ALT: 28 U/L (ref 0–44)
ANION GAP: 11 (ref 5–15)
AST: 28 U/L (ref 15–41)
Alkaline Phosphatase: 52 U/L (ref 38–126)
BILIRUBIN TOTAL: 0.9 mg/dL (ref 0.3–1.2)
BUN: 10 mg/dL (ref 6–20)
CALCIUM: 9.3 mg/dL (ref 8.9–10.3)
CO2: 22 mmol/L (ref 22–32)
Chloride: 104 mmol/L (ref 98–111)
Creatinine, Ser: 1.19 mg/dL (ref 0.61–1.24)
GFR calc Af Amer: >60 mL/min (ref >60)
GFR calc non Af Amer: >60 mL/min (ref >60)
GLUCOSE: 94 mg/dL (ref 70–99)
Potassium: 3.3 mmol/L — ABNORMAL LOW (ref 3.5–5.1)
Sodium: 137 mmol/L (ref 135–145)
TOTAL PROTEIN: 7.5 g/dL (ref 6.5–8.1)

## 2018-01-21 LAB — I-STAT CG4 LACTIC ACID, ED: LACTIC ACID, VENOUS: 1.95 mmol/L — AB (ref 0.5–1.9)

## 2018-01-21 LAB — CBC
HCT: 44.1 % (ref 39.0–52.0)
Hemoglobin: 14.5 g/dL (ref 13.0–17.0)
MCH: 28.5 pg (ref 26.0–34.0)
MCHC: 32.9 g/dL (ref 30.0–36.0)
MCV: 86.8 fL (ref 80.0–100.0)
PLATELETS: 110 10*3/uL — AB (ref 150–400)
RBC: 5.08 MIL/uL (ref 4.22–5.81)
RDW: 13.9 % (ref 11.5–15.5)
WBC: 4.4 10*3/uL (ref 4.0–10.5)
nRBC: 0 % (ref 0.0–0.2)

## 2018-01-21 LAB — LIPASE, BLOOD: Lipase: 31 U/L (ref 11–51)

## 2018-01-21 MED ORDER — POTASSIUM CHLORIDE CRYS ER 20 MEQ PO TBCR
40.0000 meq | EXTENDED_RELEASE_TABLET | Freq: Once | ORAL | Status: AC
  Administered 2018-01-21: 40 meq via ORAL
  Filled 2018-01-21: qty 2

## 2018-01-21 MED ORDER — ACETAMINOPHEN 325 MG PO TABS
650.0000 mg | ORAL_TABLET | Freq: Once | ORAL | Status: AC | PRN
  Administered 2018-01-21: 650 mg via ORAL
  Filled 2018-01-21: qty 2

## 2018-01-21 MED ORDER — IBUPROFEN 200 MG PO TABS
400.0000 mg | ORAL_TABLET | Freq: Once | ORAL | Status: AC
  Administered 2018-01-21: 400 mg via ORAL
  Filled 2018-01-21: qty 2

## 2018-01-21 MED ORDER — OSELTAMIVIR PHOSPHATE 75 MG PO CAPS: 75.0000 mg | ORAL_CAPSULE | Freq: Two times a day (BID) | ORAL | 0 refills | Status: AC

## 2018-01-21 MED ORDER — ONDANSETRON 8 MG PO TBDP
8.0000 mg | ORAL_TABLET | Freq: Once | ORAL | Status: AC
  Administered 2018-01-21: 8 mg via ORAL
  Filled 2018-01-21: qty 1

## 2018-01-21 NOTE — ED Notes (Signed)
Patient given oral fluids at this time.

## 2018-01-21 NOTE — ED Provider Notes (Signed)
[facility]-EMERGENCY DEPT Provider Note   CSN: [id number] Arrival date & time: 01/21/18  1258     History   Chief Complaint Chief Complaint  Patient presents with  . Fever  . Cough  . Sore Throat  . Generalized Body Aches    HPI Warren Mcbride is a 23 y.o. male.  Patient c/o cough, sore throat, runny nose/congestion, body aches, fever, acute onset yesterday. Symptoms constant, persistent. No chest pain or sob. Episode of post tussive emesis, not bloody. No diarrhea. No abd pain. No specific, known ill contacts.   The history is provided by the patient.  Fever  Associated symptoms: congestion, cough, myalgias, rhinorrhea and sore throat   Associated symptoms: no chest pain, no confusion, no diarrhea, no dysuria, no headaches and no rash   Cough  Associated symptoms: fever, myalgias, rhinorrhea and sore throat   Associated symptoms: no chest pain, no eye discharge, no headaches, no rash and no shortness of breath   Sore Throat  Pertinent negatives include no chest pain, no abdominal pain, no headaches and no shortness of breath.    Past Medical History:  Diagnosis Date  . ADHD (attention deficit hyperactivity disorder)   . Anxiety   . Asthma   . Depression   . 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 Johney FrameAllred - no PPM indicated at the time; AV block due to increased vagal tone     Patient Active Problem List   Diagnosis Date Noted  . MDD (major depressive disorder), recurrent severe, without psychosis (HCC) 09/10/2015  . Depression 05/10/2015  . Bipolar I disorder, most recent episode depressed (HCC) 05/08/2015  . Polysubstance dependence including opioid type drug, episodic abuse (HCC) 05/08/2015  . Overdose of benzodiazepine   . Sinus pause   . Overdose 05/07/2015  . Tachypnea 05/07/2015  . History of asthma 05/07/2015  . Blurred vision 05/07/2015  . First degree AV block 05/07/2015  . Syncope 05/18/2013    Past Surgical  History:  Procedure Laterality Date  . NO PAST SURGERIES          Home Medications    Prior to Admission medications   Medication Sig Start Date End Date Taking? Authorizing Provider  albuterol (PROVENTIL HFA;VENTOLIN HFA) 108 (90 Base) MCG/ACT inhaler Inhale 2 puffs into the lungs every 6 (six) hours as needed for wheezing or shortness of breath.   Yes [provider]  albuterol (PROVENTIL) (2.5 MG/3ML) 0.083% nebulizer solution Take 3 mLs (2.5 mg total) by nebulization every 4 (four) hours as needed for wheezing or shortness of breath. 08/28/16  Yes Delo, Riley Lamouglas, MD  azithromycin (ZITHROMAX) 250 MG tablet Take 1 tablet (250 mg total) by mouth daily. Take first 2 tablets together, then 1 every day until finished. Patient not taking: Reported on 09/09/2017 08/28/16   Geoffery Lyonselo, Douglas, MD  naproxen (NAPROSYN) 500 MG tablet Take 1 tablet (500 mg total) by mouth 2 (two) times daily with a meal. Patient not taking: Reported on 09/09/2017 06/04/16   Georgiana ShoreMitchell, Jessica B, PA-C  omeprazole (PRILOSEC) 20 MG capsule Take 1 capsule (20 mg total) by mouth daily. Patient not taking: Reported on 06/04/2016 04/05/16   Molpus, Jonny RuizJohn, MD  ondansetron (ZOFRAN) 4 MG tablet Take 1 tablet (4 mg total) by mouth every 6 (six) hours. Patient not taking: Reported on 06/04/2016 05/14/16   Gwyneth SproutPlunkett, Whitney, MD  ondansetron (ZOFRAN-ODT) 4 MG disintegrating tablet Take 1 tablet (4 mg total) by mouth every 8 (  eight) hours as needed for nausea or vomiting. Patient not taking: Reported on 09/09/2017 02/25/17   Benjiman Core, MD  predniSONE (DELTASONE) 10 MG tablet Take 2 tablets (20 mg total) by mouth daily. Patient not taking: Reported on 01/21/2018 09/09/17   Margarita Grizzle, MD    Family History Family History  Problem Relation Age of Onset  . Hypertension Father     Social History Social History   Tobacco Use  . Smoking status: Current Every Day Smoker    Packs/day: 0.50    Years: 4.00    Pack years: 2.00     Types: Cigarettes  . Smokeless tobacco: Never Used  Substance Use Topics  . Alcohol use: Yes    Comment: occasional   . Drug use: Yes    Frequency: 7.0 times per week    Types: Marijuana    Comment: occasionally     Allergies   Patient has no known allergies.   Review of Systems Review of Systems  Constitutional: Positive for fever.  HENT: Positive for congestion, rhinorrhea and sore throat.   Eyes: Negative for discharge and redness.  Respiratory: Positive for cough. Negative for shortness of breath.   Cardiovascular: Negative for chest pain.  Gastrointestinal: Negative for abdominal pain and diarrhea.  Genitourinary: Negative for dysuria and flank pain.  Musculoskeletal: Positive for myalgias. Negative for neck pain and neck stiffness.  Skin: Negative for rash.  Neurological: Negative for headaches.  Hematological: Does not bruise/bleed easily.  Psychiatric/Behavioral: Negative for confusion.     Physical Exam Updated Vital Signs BP 119/67 (BP Location: Left Arm)   Pulse (!) 108   Temp (!) 103 F (39.4 C) (Oral)   Resp 20   Ht 1.88 m (6\' 2" )   Wt 99.8 kg   SpO2 100%   BMI 28.25 kg/m   Physical Exam Vitals signs and nursing note reviewed.  Constitutional:      Appearance: Normal appearance. He is well-developed.     Comments: Febrile.   HENT:     Head: Atraumatic.     Nose: Congestion and rhinorrhea present.     Mouth/Throat:     Mouth: Mucous membranes are moist.     Pharynx: Oropharynx is clear. Posterior oropharyngeal erythema present. No oropharyngeal exudate.     Comments: Mild erythema, no exudate. No swelling and/or no asymmetric swelling or abscess.  Eyes:     General: No scleral icterus.    Conjunctiva/sclera: Conjunctivae normal.     Pupils: Pupils are equal, round, and reactive to light.  Neck:     Musculoskeletal: Normal range of motion and neck supple. No neck rigidity.     Trachea: No tracheal deviation.     Comments: No stiffness or  rigidity. Cardiovascular:     Rate and Rhythm: Normal rate and regular rhythm.     Pulses: Normal pulses.     Heart sounds: Normal heart sounds. No murmur. No friction rub. No gallop.   Pulmonary:     Effort: Pulmonary effort is normal. No accessory muscle usage or respiratory distress.     Breath sounds: Normal breath sounds.  Abdominal:     General: Bowel sounds are normal. There is no distension.     Palpations: Abdomen is soft.     Tenderness: There is no abdominal tenderness. There is no guarding.  Genitourinary:    Comments: No cva tenderness. Musculoskeletal:        General: No swelling.  Lymphadenopathy:     Cervical: No cervical adenopathy.  Skin:    General: Skin is warm and dry.     Findings: No rash.  Neurological:     Mental Status: He is alert.     Comments: Alert, speech clear.   Psychiatric:        Mood and Affect: Mood normal.      ED Treatments / Results  Labs (all labs ordered are listed, but only abnormal results are displayed) Results for orders placed or performed during the hospital encounter of 01/21/18  Lipase, blood  Result Value Ref Range   Lipase 31 11 - 51 U/L  Comprehensive metabolic panel  Result Value Ref Range   Sodium 137 135 - 145 mmol/L   Potassium 3.3 (L) 3.5 - 5.1 mmol/L   Chloride 104 98 - 111 mmol/L   CO2 22 22 - 32 mmol/L   Glucose, Bld 94 70 - 99 mg/dL   BUN 10 6 - 20 mg/dL   Creatinine, Ser 1.61 0.61 - 1.24 mg/dL   Calcium 9.3 8.9 - 09.6 mg/dL   Total Protein 7.5 6.5 - 8.1 g/dL   Albumin 4.6 3.5 - 5.0 g/dL   AST 28 15 - 41 U/L   ALT 28 0 - 44 U/L   Alkaline Phosphatase 52 38 - 126 U/L   Total Bilirubin 0.9 0.3 - 1.2 mg/dL   GFR calc non Af Amer >60 >60 mL/min   GFR calc Af Amer >60 >60 mL/min   Anion gap 11 5 - 15  Urinalysis, Routine w reflex microscopic  Result Value Ref Range   Color, Urine YELLOW YELLOW   APPearance CLEAR CLEAR   Specific Gravity, Urine 1.028 1.005 - 1.030   pH 8.0 5.0 - 8.0   Glucose, UA  NEGATIVE NEGATIVE mg/dL   Hgb urine dipstick NEGATIVE NEGATIVE   Bilirubin Urine NEGATIVE NEGATIVE   Ketones, ur NEGATIVE NEGATIVE mg/dL   Protein, ur NEGATIVE NEGATIVE mg/dL   Nitrite NEGATIVE NEGATIVE   Leukocytes, UA NEGATIVE NEGATIVE  I-Stat CG4 Lactic Acid, ED  Result Value Ref Range   Lactic Acid, Venous 1.95 (H) 0.5 - 1.9 mmol/L    EKG None  Radiology Dg Chest 2 View  Result Date: 01/21/2018 CLINICAL DATA:  Worsening productive cough for several days.  Fever. EXAM: CHEST - 2 VIEW COMPARISON:  08/28/2016 FINDINGS: The heart size and mediastinal contours are within normal limits. Both lungs are clear. The visualized skeletal structures are unremarkable. IMPRESSION: Normal study.  No active cardiopulmonary disease. Electronically Signed   By: Myles Rosenthal M.D.   On: 01/21/2018 14:15    Procedures Procedures (including critical care time)  Medications Ordered in ED Medications  ondansetron (ZOFRAN-ODT) disintegrating tablet 8 mg (has no administration in time range)  acetaminophen (TYLENOL) tablet 650 mg (650 mg Oral Given 01/21/18 1312)     Initial Impression / Assessment and Plan / ED Course  I have reviewed the triage vital signs and the nursing notes.  Pertinent labs & imaging results that were available during my care of the patient were reviewed by me and considered in my medical decision making (see chart for details).  Nursing sent labs.   zofran po. Po fluids. Medication for fever given in triage.  Reviewed nursing notes and prior charts for additional history.   Labs reviewed - k sl low. kcl po. Po fluids. Food.   cxr reviewed - no pna.   Patients symptoms/exam felt most c/w flu or flu-like illness. As recent onset, will give rx tamiflu.   Pt  is tolerating po, and appears in no acute distress.  Pt currently appears stable for d/c.   Return precautions provided.     Final Clinical Impressions(s) / ED Diagnoses   Final diagnoses:  None    ED  Discharge Orders    None       Cathren LaineSteinl, Malcolm Quast, MD 01/21/18 1442

## 2018-01-21 NOTE — ED Triage Notes (Signed)
Pt c/o cough, sore throat, fever, body aches and vomiting. Denies taking any medications for fever.

## 2018-01-21 NOTE — Discharge Instructions (Signed)
It was our pleasure to provide your ER care today - we hope that you feel better.  Rest. Drink plenty of fluids.  Take ibuprofen as need for fever/body aches.   You may also try a multi-symptom cold and flu medication such as mucinex or nyquil as need.   Take tamiflu (flu medication) as prescribed.   Follow up with primary care doctor in 1-2 weeks if symptoms fail to improve/resolve. From today's labs, your potassium level is mildly low (3.3) - eat plenty of fruits/vegetables, follow up with primary care doctor.   Return to ER if worse, new symptoms, increased trouble breathing, persistent vomiting, other concern.

## 2018-01-24 ENCOUNTER — Encounter (HOSPITAL_COMMUNITY): Payer: Self-pay | Admitting: Emergency Medicine

## 2018-01-24 ENCOUNTER — Other Ambulatory Visit: Payer: Self-pay

## 2018-01-24 ENCOUNTER — Emergency Department (HOSPITAL_COMMUNITY)
Admission: EM | Admit: 2018-01-24 | Discharge: 2018-01-24 | Disposition: A | Discharge status: Home / Self Care | Payer: Self-pay | Attending: Emergency Medicine | Admitting: Emergency Medicine

## 2018-01-24 DIAGNOSIS — R112 Nausea with vomiting, unspecified: Secondary | ICD-10-CM | POA: Insufficient documentation

## 2018-01-24 DIAGNOSIS — Z5321 Procedure and treatment not carried out due to patient leaving prior to being seen by health care provider: Secondary | ICD-10-CM | POA: Insufficient documentation

## 2018-01-24 NOTE — ED Notes (Signed)
No answer when called for room. 1st call.

## 2018-01-24 NOTE — ED Triage Notes (Signed)
Pt arriving with flu symptoms and N/V. Symptoms have been present since last Friday. Pt was seen on 1/11 and given prescription for Tamiflu but did not have it filled.

## 2018-01-24 NOTE — ED Notes (Signed)
No answer when called for room x 3 

## 2018-01-24 NOTE — ED Notes (Signed)
No answer when called for room x 2 

## 2018-01-27 ENCOUNTER — Emergency Department (HOSPITAL_COMMUNITY): Payer: Self-pay

## 2018-01-27 ENCOUNTER — Emergency Department (HOSPITAL_COMMUNITY)
Admission: EM | Admit: 2018-01-27 | Discharge: 2018-01-27 | Disposition: A | Discharge status: Home / Self Care | Payer: Self-pay | Attending: Emergency Medicine | Admitting: Emergency Medicine

## 2018-01-27 DIAGNOSIS — J45909 Unspecified asthma, uncomplicated: Secondary | ICD-10-CM | POA: Insufficient documentation

## 2018-01-27 DIAGNOSIS — F909 Attention-deficit hyperactivity disorder, unspecified type: Secondary | ICD-10-CM | POA: Insufficient documentation

## 2018-01-27 DIAGNOSIS — J189 Pneumonia, unspecified organism: Secondary | ICD-10-CM

## 2018-01-27 DIAGNOSIS — Z79899 Other long term (current) drug therapy: Secondary | ICD-10-CM | POA: Insufficient documentation

## 2018-01-27 DIAGNOSIS — F17210 Nicotine dependence, cigarettes, uncomplicated: Secondary | ICD-10-CM | POA: Insufficient documentation

## 2018-01-27 DIAGNOSIS — E876 Hypokalemia: Secondary | ICD-10-CM | POA: Insufficient documentation

## 2018-01-27 DIAGNOSIS — R6889 Other general symptoms and signs: Secondary | ICD-10-CM

## 2018-01-27 DIAGNOSIS — J181 Lobar pneumonia, unspecified organism: Secondary | ICD-10-CM | POA: Insufficient documentation

## 2018-01-27 LAB — CBC WITH DIFFERENTIAL/PLATELET
ABS IMMATURE GRANULOCYTES: 0.03 10*3/uL (ref 0.00–0.07)
Basophils Absolute: 0 10*3/uL (ref 0.0–0.1)
Basophils Relative: 0 %
Eosinophils Absolute: 0 10*3/uL (ref 0.0–0.5)
Eosinophils Relative: 0 %
HEMATOCRIT: 51 % (ref 39.0–52.0)
HEMOGLOBIN: 17 g/dL (ref 13.0–17.0)
IMMATURE GRANULOCYTES: 0 %
LYMPHS ABS: 2.2 10*3/uL (ref 0.7–4.0)
LYMPHS PCT: 31 %
MCH: 27.4 pg (ref 26.0–34.0)
MCHC: 33.3 g/dL (ref 30.0–36.0)
MCV: 82.1 fL (ref 80.0–100.0)
Monocytes Absolute: 0.5 10*3/uL (ref 0.1–1.0)
Monocytes Relative: 8 %
NEUTROS ABS: 4.4 10*3/uL (ref 1.7–7.7)
Neutrophils Relative %: 61 %
Platelets: 101 10*3/uL — ABNORMAL LOW (ref 150–400)
RBC: 6.21 MIL/uL — ABNORMAL HIGH (ref 4.22–5.81)
RDW: 12.6 % (ref 11.5–15.5)
WBC: 7.1 10*3/uL (ref 4.0–10.5)
nRBC: 0 % (ref 0.0–0.2)

## 2018-01-27 LAB — COMPREHENSIVE METABOLIC PANEL
ALBUMIN: 3.9 g/dL (ref 3.5–5.0)
ALK PHOS: 54 U/L (ref 38–126)
ALT: 46 U/L — AB (ref 0–44)
AST: 46 U/L — AB (ref 15–41)
Anion gap: 16 — ABNORMAL HIGH (ref 5–15)
BUN: 11 mg/dL (ref 6–20)
CALCIUM: 9.2 mg/dL (ref 8.9–10.3)
CO2: 24 mmol/L (ref 22–32)
CREATININE: 1.16 mg/dL (ref 0.61–1.24)
Chloride: 93 mmol/L — ABNORMAL LOW (ref 98–111)
GFR calc Af Amer: >60 mL/min (ref >60)
GFR calc non Af Amer: >60 mL/min (ref >60)
GLUCOSE: 105 mg/dL — AB (ref 70–99)
Potassium: 3 mmol/L — ABNORMAL LOW (ref 3.5–5.1)
Sodium: 133 mmol/L — ABNORMAL LOW (ref 135–145)
TOTAL PROTEIN: 7.7 g/dL (ref 6.5–8.1)
Total Bilirubin: 1.3 mg/dL — ABNORMAL HIGH (ref 0.3–1.2)

## 2018-01-27 MED ORDER — ONDANSETRON 4 MG PO TBDP: 4.0000 mg | ORAL_TABLET | Freq: Three times a day (TID) | ORAL | 0 refills | Status: AC | PRN

## 2018-01-27 MED ORDER — SODIUM CHLORIDE 0.9 % IV BOLUS
1000.0000 mL | Freq: Once | INTRAVENOUS | Status: AC
  Administered 2018-01-27: 1000 mL via INTRAVENOUS

## 2018-01-27 MED ORDER — ALBUTEROL SULFATE HFA 108 (90 BASE) MCG/ACT IN AERS
1.0000 | INHALATION_SPRAY | Freq: Once | RESPIRATORY_TRACT | Status: AC
  Administered 2018-01-27: 2 via RESPIRATORY_TRACT
  Filled 2018-01-27: qty 6.7

## 2018-01-27 MED ORDER — POTASSIUM CHLORIDE CRYS ER 20 MEQ PO TBCR: 20.0000 meq | EXTENDED_RELEASE_TABLET | Freq: Every day | ORAL | 0 refills | Status: AC

## 2018-01-27 MED ORDER — ONDANSETRON HCL 4 MG/2ML IJ SOLN
4.0000 mg | Freq: Once | INTRAMUSCULAR | Status: AC
  Administered 2018-01-27: 4 mg via INTRAVENOUS
  Filled 2018-01-27: qty 2

## 2018-01-27 MED ORDER — PREDNISONE 20 MG PO TABS
60.0000 mg | ORAL_TABLET | Freq: Once | ORAL | Status: AC
  Administered 2018-01-27: 60 mg via ORAL
  Filled 2018-01-27: qty 3

## 2018-01-27 MED ORDER — IPRATROPIUM-ALBUTEROL 0.5-2.5 (3) MG/3ML IN SOLN
3.0000 mL | Freq: Once | RESPIRATORY_TRACT | Status: AC
  Administered 2018-01-27: 3 mL via RESPIRATORY_TRACT
  Filled 2018-01-27: qty 3

## 2018-01-27 MED ORDER — PREDNISONE 50 MG PO TABS: 50.0000 mg | ORAL_TABLET | Freq: Every day | ORAL | 0 refills | Status: AC

## 2018-01-27 MED ORDER — DOXYCYCLINE HYCLATE 100 MG PO CAPS: 100.0000 mg | ORAL_CAPSULE | Freq: Two times a day (BID) | ORAL | 0 refills | Status: AC

## 2018-01-27 MED ORDER — POTASSIUM CHLORIDE CRYS ER 20 MEQ PO TBCR
40.0000 meq | EXTENDED_RELEASE_TABLET | Freq: Once | ORAL | Status: AC
  Administered 2018-01-27: 40 meq via ORAL
  Filled 2018-01-27: qty 2

## 2018-01-27 NOTE — ED Notes (Signed)
Pt ambulates in hallway. No supplemental O2. Maintains 99%. Steady and strong on feet.

## 2018-01-27 NOTE — ED Provider Notes (Addendum)
[facility] EMERGENCY DEPARTMENT Provider Note   CSN: [id number] Arrival date & time: 01/27/18  1336     History   Chief Complaint Chief Complaint  Patient presents with  . Asthma  . Chills    HPI Warren Mcbride is a 23 y.o. male with past medical history of asthma, presenting to the emergency department complaint of stent flulike symptoms.  Patient states his symptoms initially began last Friday, 1 week ago.  He was evaluated in the ED on 01/21/2018 and diagnosed with an influenza-like illness.  He was prescribed Tamiflu, however states he could not afford the medication and therefore never filled the prescription.  He states he felt somewhat better on Monday, however symptoms returned that evening when he tried to advance his diet.  His symptoms consist of a productive cough of yellowish sputum.  He has intermittent epigastric abdominal pain with nausea and vomiting.  He states his throat is sore as well.  His asthma has been acting up since yesterday, with worsening shortness of breath and chest tightness.  He has been treating his symptoms with TheraFlu as well as ibuprofen and Tylenol.  Last doses were last night.  His fever has been running 99 F to 100 F. Per chart review, chest x-ray was negative during recent ED visit.  Labs were unremarkable.  The history is provided by the patient.    Past Medical History:  Diagnosis Date  . ADHD (attention deficit hyperactivity disorder)   . Anxiety   . Asthma   . Depression   . 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 Johney Frame - no PPM indicated at the time; AV block due to increased vagal tone     Patient Active Problem List   Diagnosis Date Noted  . MDD (major depressive disorder), recurrent severe, without psychosis (HCC) 09/10/2015  . Depression 05/10/2015  . Bipolar I disorder, most recent episode depressed (HCC) 05/08/2015  . Polysubstance dependence including opioid type drug, episodic abuse  (HCC) 05/08/2015  . Overdose of benzodiazepine   . Sinus pause   . Overdose 05/07/2015  . Tachypnea 05/07/2015  . History of asthma 05/07/2015  . Blurred vision 05/07/2015  . First degree AV block 05/07/2015  . Syncope 05/18/2013    Past Surgical History:  Procedure Laterality Date  . NO PAST SURGERIES          Home Medications    Prior to Admission medications   Medication Sig Start Date End Date Taking? Authorizing Provider  albuterol (PROVENTIL HFA;VENTOLIN HFA) 108 (90 Base) MCG/ACT inhaler Inhale 2 puffs into the lungs every 6 (six) hours as needed for wheezing or shortness of breath.    [provider]  albuterol (PROVENTIL) (2.5 MG/3ML) 0.083% nebulizer solution Take 3 mLs (2.5 mg total) by nebulization every 4 (four) hours as needed for wheezing or shortness of breath. 08/28/16   Geoffery Lyons, MD  azithromycin (ZITHROMAX) 250 MG tablet Take 1 tablet (250 mg total) by mouth daily. Take first 2 tablets together, then 1 every day until finished. Patient not taking: Reported on 09/09/2017 08/28/16   Geoffery Lyons, MD  naproxen (NAPROSYN) 500 MG tablet Take 1 tablet (500 mg total) by mouth 2 (two) times daily with a meal. Patient not taking: Reported on 09/09/2017 06/04/16   Georgiana Shore, PA-C  omeprazole (PRILOSEC) 20 MG capsule Take 1 capsule (20 mg total) by mouth daily. Patient not taking: Reported on 06/04/2016 04/05/16   Molpus, Jonny Ruiz,  MD  ondansetron (ZOFRAN) 4 MG tablet Take 1 tablet (4 mg total) by mouth every 6 (six) hours. Patient not taking: Reported on 06/04/2016 05/14/16   Gwyneth Sprout, MD  ondansetron (ZOFRAN-ODT) 4 MG disintegrating tablet Take 1 tablet (4 mg total) by mouth every 8 (eight) hours as needed for nausea or vomiting. Patient not taking: Reported on 09/09/2017 02/25/17   Benjiman Core, MD  oseltamivir (TAMIFLU) 75 MG capsule Take 1 capsule (75 mg total) by mouth every 12 (twelve) hours. 01/21/18   Cathren Laine, MD  predniSONE (DELTASONE)  10 MG tablet Take 2 tablets (20 mg total) by mouth daily. Patient not taking: Reported on 01/21/2018 09/09/17   Margarita Grizzle, MD    Family History Family History  Problem Relation Age of Onset  . Hypertension Father     Social History Social History   Tobacco Use  . Smoking status: Current Every Day Smoker    Packs/day: 0.50    Years: 4.00    Pack years: 2.00    Types: Cigarettes  . Smokeless tobacco: Never Used  Substance Use Topics  . Alcohol use: Yes    Comment: occasional   . Drug use: Yes    Frequency: 7.0 times per week    Types: Marijuana    Comment: occasionally     Allergies   Patient has no known allergies.   Review of Systems Review of Systems  Constitutional: Positive for chills and fever.  HENT: Positive for sore throat. Negative for ear pain.   Respiratory: Positive for cough and shortness of breath.   Gastrointestinal: Positive for abdominal pain, nausea and vomiting. Negative for constipation and diarrhea.  All other systems reviewed and are negative.    Physical Exam Updated Vital Signs BP 130/74 (BP Location: Right Arm)   Pulse 97   Temp 98.3 F (36.8 C) (Oral)   Resp 20   SpO2 98%   Physical Exam Vitals signs and nursing note reviewed.  Constitutional:      General: He is not in acute distress.    Appearance: He is well-developed.  HENT:     Head: Normocephalic and atraumatic.     Jaw: No trismus.     Right Ear: Tympanic membrane and ear canal normal.     Left Ear: Tympanic membrane and ear canal normal.     Mouth/Throat:     Pharynx: Uvula midline. Posterior oropharyngeal erythema present. No pharyngeal swelling, oropharyngeal exudate or uvula swelling.     Tonsils: No tonsillar exudate.  Eyes:     Conjunctiva/sclera: Conjunctivae normal.  Neck:     Musculoskeletal: Normal range of motion and neck supple. No neck rigidity.  Cardiovascular:     Rate and Rhythm: Normal rate and regular rhythm.  Pulmonary:     Effort: Pulmonary  effort is normal.     Breath sounds: No stridor. Wheezing present.     Comments: mildly increased effort with breathing, however no respiratory distress.  Normal O2 saturation. Abdominal:     General: Abdomen is flat. Bowel sounds are normal.     Palpations: Abdomen is soft. There is no mass.     Tenderness: There is generalized abdominal tenderness. There is no guarding or rebound.  Skin:    General: Skin is warm.  Neurological:     Mental Status: He is alert.  Psychiatric:        Behavior: Behavior normal.      ED Treatments / Results  Labs (all labs ordered are listed, but  only abnormal results are displayed) Labs Reviewed  CBC WITH DIFFERENTIAL/PLATELET  COMPREHENSIVE METABOLIC PANEL    EKG None  Radiology No results found.  Procedures Procedures (including critical care time)  Medications Ordered in ED Medications  ipratropium-albuterol (DUONEB) 0.5-2.5 (3) MG/3ML nebulizer solution 3 mL (has no administration in time range)  sodium chloride 0.9 % bolus 1,000 mL (1,000 mLs Intravenous New Bag/Given 01/27/18 1508)  predniSONE (DELTASONE) tablet 60 mg (60 mg Oral Given 01/27/18 1509)  ondansetron (ZOFRAN) injection 4 mg (4 mg Intravenous Given 01/27/18 1509)     Initial Impression / Assessment and Plan / ED Course  I have reviewed the triage vital signs and the nursing notes.  Pertinent labs & imaging results that were available during my care of the patient were reviewed by me and considered in my medical decision making (see chart for details).  Clinical Course as of Jan 27 1541  Fri Jan 27, 2018  1542 On re-eval after duoneb, pt sleeping with normal work of breathing,no distress. Upon awakening, pt began breathing heavily again. Reports improvement after duoneb. Care assumed by oncoming provider.    [JR]    Clinical Course User Index [JR] Kiira Brach, [place] N, PA-C   Patient presenting to the ED for subsequent visit with flulike symptoms and asthma  exacerbation.  Patient with short interim improvement on Monday, however symptoms returned and persist today.  Patient with productive cough, low-grade fevers, and began having shortness of breath yesterday evening attributed to his asthma.  He has epigastric abdominal pain that is intermittent with nausea and vomiting.  On exam, lungs with mild wheezes in the bases.  Afebrile with normal vital signs.  Abdomen is soft with generalized tenderness, no focal tenderness, no guarding or rebound.  DuoNeb and prednisone ordered.  IV fluids and Zofran for abdominal symptoms.  Will repeat chest x-ray to rule out pneumonia.  Care will be assumed by PA Petrucelli, who will follow-up on labs and imaging and determine disposition.  Final Clinical Impressions(s) / ED Diagnoses   Final diagnoses:  None    ED Discharge Orders    None       Anaclara Acklin, [place] N, PA-C 01/27/18 1534    Kashon Kraynak, [place] N, [place]PA-C 01/27/18 1543    Wynetta FinesMessick, Peter C, MD 01/27/18 1819

## 2018-01-27 NOTE — ED Notes (Signed)
Patient transported to X-ray 

## 2018-01-27 NOTE — ED Provider Notes (Signed)
16:00: Assumed care form [place] Robinson PA-C at change of shift pending work-up and re-evaluation. Likely plan for discharge home with symptomatic care and prednisone for asthma exacerbation.   Per prior provider note history:   Warren Mcbride is a 23 y.o. male with past medical history of asthma, presenting to the emergency department complaint of stent flulike symptoms.  Patient states his symptoms initially began last Friday, 1 week ago.  He was evaluated in the ED on 01/21/2018 and diagnosed with an influenza-like illness.  He was prescribed Tamiflu, however states he could not afford the medication and therefore never filled the prescription.  He states he felt somewhat better on Monday, however symptoms returned that evening when he tried to advance his diet.  His symptoms consist of a productive cough of yellowish sputum.  He has intermittent epigastric abdominal pain with nausea and vomiting.  He states his throat is sore as well.  His asthma has been acting up since yesterday, with worsening shortness of breath and chest tightness.  He has been treating his symptoms with TheraFlu as well as ibuprofen and Tylenol.  Last doses were last night.  His fever has been running 99 F to 100 F. Per chart review, chest x-ray was negative during recent ED visit.  Labs were unremarkable.   Physical Exam  BP 130/74 (BP Location: Right Arm)   Pulse 97   Temp 98.3 F (36.8 C) (Oral)   Resp 20   SpO2 98%   Physical Exam Vitals signs and nursing note reviewed.  Constitutional:      General: He is not in acute distress.    Appearance: He is well-developed.  HENT:     Head: Normocephalic and atraumatic.  Eyes:     General:        Right eye: No discharge.        Left eye: No discharge.     Conjunctiva/sclera: Conjunctivae normal.  Cardiovascular:     Rate and Rhythm: Normal rate and regular rhythm.  Pulmonary:     Effort: Pulmonary effort is normal. No respiratory distress.     Breath sounds:  Decreased breath sounds (L base) present. No wheezing.  Abdominal:     Palpations: Abdomen is soft.     Tenderness: There is no abdominal tenderness. There is no guarding or rebound.  Neurological:     Mental Status: He is alert.     Comments: Clear speech.   Psychiatric:        Behavior: Behavior normal.        Thought Content: Thought content normal.     ED Course/Procedures   Results for orders placed or performed during the hospital encounter of 01/27/18  CBC with Differential  Result Value Ref Range   WBC 7.1 4.0 - 10.5 K/uL   RBC 6.21 (H) 4.22 - 5.81 MIL/uL   Hemoglobin 17.0 13.0 - 17.0 g/dL   HCT 16.151.0 09.639.0 - 04.552.0 %   MCV 82.1 80.0 - 100.0 fL   MCH 27.4 26.0 - 34.0 pg   MCHC 33.3 30.0 - 36.0 g/dL   RDW 40.912.6 81.111.5 - 91.415.5 %   Platelets 101 (L) 150 - 400 K/uL   nRBC 0.0 0.0 - 0.2 %   Neutrophils Relative % 61 %   Neutro Abs 4.4 1.7 - 7.7 K/uL   Lymphocytes Relative 31 %   Lymphs Abs 2.2 0.7 - 4.0 K/uL   Monocytes Relative 8 %   Monocytes Absolute 0.5 0.1 - 1.0 K/uL  Eosinophils Relative 0 %   Eosinophils Absolute 0.0 0.0 - 0.5 K/uL   Basophils Relative 0 %   Basophils Absolute 0.0 0.0 - 0.1 K/uL   Immature Granulocytes 0 %   Abs Immature Granulocytes 0.03 0.00 - 0.07 K/uL  Comprehensive metabolic panel  Result Value Ref Range   Sodium 133 (L) 135 - 145 mmol/L   Potassium 3.0 (L) 3.5 - 5.1 mmol/L   Chloride 93 (L) 98 - 111 mmol/L   CO2 24 22 - 32 mmol/L   Glucose, Bld 105 (H) 70 - 99 mg/dL   BUN 11 6 - 20 mg/dL   Creatinine, Ser 0.31 0.61 - 1.24 mg/dL   Calcium 9.2 8.9 - 59.4 mg/dL   Total Protein 7.7 6.5 - 8.1 g/dL   Albumin 3.9 3.5 - 5.0 g/dL   AST 46 (H) 15 - 41 U/L   ALT 46 (H) 0 - 44 U/L   Alkaline Phosphatase 54 38 - 126 U/L   Total Bilirubin 1.3 (H) 0.3 - 1.2 mg/dL   GFR calc non Af Amer >60 >60 mL/min   GFR calc Af Amer >60 >60 mL/min   Anion gap 16 (H) 5 - 15   Dg Chest 2 View  Result Date: 01/27/2018 CLINICAL DATA:  Body aches, fevers, chills  EXAM: CHEST - 2 VIEW COMPARISON:  01/21/2018 FINDINGS: There is left lower lobe airspace disease most consistent with pneumonia. There is no pleural effusion or pneumothorax. The heart and mediastinal contours are unremarkable. The osseous structures are unremarkable. IMPRESSION: Left lower lobe pneumonia. Electronically Signed   By: Elige Ko   On: 01/27/2018 17:44   Dg Chest 2 View  Result Date: 01/21/2018 CLINICAL DATA:  Worsening productive cough for several days.  Fever. EXAM: CHEST - 2 VIEW COMPARISON:  08/28/2016 FINDINGS: The heart size and mediastinal contours are within normal limits. Both lungs are clear. The visualized skeletal structures are unremarkable. IMPRESSION: Normal study.  No active cardiopulmonary disease. Electronically Signed   By: Myles Rosenthal M.D.   On: 01/21/2018 14:15    Clinical Course as of Jan 27 1642  Fri Jan 27, 2018  1542 On re-eval after duoneb, pt sleeping with normal work of breathing,no distress. Upon awakening, pt began breathing heavily again. Reports improvement after duoneb. Care assumed by oncoming provider.    [JR]    Clinical Course User Index [JR] Robinson, [place] N, PA-C    Procedures   MDM  Patient presents to the emergency department for flulike symptoms for the past 1 week.  His work-up has been reviewed: He has some mild electrolyte abnormalities including hyponatremia at 133, hypochloremia at 93, and hypokalemia 3.0.  He received IV fluids in the ER.  He also was given oral potassium and will to be discharged home with a few tablets as well.  Diet recommendations included in discharge summary.  He does have an elevated anion gap at 16, however he is not acidotic.  His renal function is within normal limits.  He was well hydrated in the ER which is suspect dehydration was overall etiology. Mild LFT elevated, non surgical abdominal exam.  No leukocytosis.  No anemia.  Platelets are low consistent with prior labs, PCP recheck.  Chest x-ray  shows left lower lobe pneumonia.  Suspect community-acquired.  Possibly post viral with likely diagnosis of flu earlier in the week.    On my assessment he is nontoxic-appearing, resting comfortably.  Vitals WNL.  Lungs with some decreased breath sounds at the  left base.  No audible wheezing following DuoNeb and prednisone in the ER. Patient is overall well-appearing, tolerating p.o., no peritoneal signs on abdominal exam, and he feels ready to go home.  His SPO2 remained at 99% with ambulation.  Discharge home with doxycycline for pneumonia, prednisone for asthma exacerbation, Zofran for any continued nausea/vomiting, as well as potassium supplementation.  Given inhaler in the ER to use at home. Patient was encouraged to fill his Tamiflu prescription and was also given a discount card. I discussed results, treatment plan, need for follow-up, and return precautions with the patient. Provided opportunity for questions, patient confirmed understanding and is in agreement with plan.   Findings and plan of care discussed with supervising physician Dr. Rodena Medin who is in agreement.         Warren Mcbride 01/27/18 1815    Wynetta Fines, MD 01/27/18 1819

## 2018-01-27 NOTE — Discharge Instructions (Addendum)
You were seen in the ER today for flulike symptoms.  Your chest x-ray showed pneumonia.  Your labs showed that you had some low potassium-please follow diet recommendations.  We are sending you home with doxycycline, an antibiotic to treat pneumonia.  Prednisone, a steroid to treat an asthma exacerbation.  Please continue to use your albuterol inhaler 1 to 2 puffs every 4-6 hours that was provided in the ER today.  We are also sending home with Zofran, antinausea medicine, take this as needed for nausea and vomiting every 8 hours.  We are sending you with a potassium supplement, take this daily for the next few days.  We have prescribed you new medication(s) today. Discuss the medications prescribed today with your pharmacist as they can have adverse effects and interactions with your other medicines including over the counter and prescribed medications. Seek medical evaluation if you start to experience new or abnormal symptoms after taking one of these medicines, seek care immediately if you start to experience difficulty breathing, feeling of your throat closing, facial swelling, or rash as these could be indications of a more serious allergic reaction  Please fill your Tamiflu prescription, we have given a discount card to help with cost of this.  Follow-up with primary care within 3 days.  Return to the ER for new or worsening symptoms or any other concerns.

## 2018-01-27 NOTE — ED Triage Notes (Signed)
Pt arrives with flu like symptoms and history of asthma- pt states worsening wheezing and chills.

## 2018-08-04 ENCOUNTER — Emergency Department (HOSPITAL_COMMUNITY)
Admission: EM | Admit: 2018-08-04 | Discharge: 2018-08-04 | Disposition: A | Discharge status: Home / Self Care | Payer: Self-pay | Attending: Emergency Medicine | Admitting: Emergency Medicine

## 2018-08-04 ENCOUNTER — Other Ambulatory Visit: Payer: Self-pay

## 2018-08-04 ENCOUNTER — Encounter (HOSPITAL_COMMUNITY): Payer: Self-pay | Admitting: *Deleted

## 2018-08-04 DIAGNOSIS — Z5321 Procedure and treatment not carried out due to patient leaving prior to being seen by health care provider: Secondary | ICD-10-CM | POA: Insufficient documentation

## 2018-08-04 DIAGNOSIS — R110 Nausea: Secondary | ICD-10-CM | POA: Insufficient documentation

## 2018-08-04 DIAGNOSIS — R109 Unspecified abdominal pain: Secondary | ICD-10-CM | POA: Insufficient documentation

## 2018-08-04 DIAGNOSIS — R42 Dizziness and giddiness: Secondary | ICD-10-CM | POA: Insufficient documentation

## 2018-08-04 DIAGNOSIS — R531 Weakness: Secondary | ICD-10-CM | POA: Insufficient documentation

## 2018-08-04 MED ORDER — SODIUM CHLORIDE 0.9% FLUSH: 3.0000 mL | Freq: Once | INTRAVENOUS | Status: DC

## 2018-08-04 NOTE — ED Notes (Signed)
Called pt for blood work x1 No response

## 2018-08-04 NOTE — ED Triage Notes (Signed)
Pt is concerned he has alcohol poisoning, pt states he drank a lot of alcohol 2 days ago and has felt weak, nausea, and dizzy since. Pt has not had alcohol since 2 days ago.

## 2018-09-01 ENCOUNTER — Emergency Department (HOSPITAL_COMMUNITY): Payer: Self-pay

## 2018-09-01 ENCOUNTER — Encounter (HOSPITAL_COMMUNITY): Payer: Self-pay | Admitting: Emergency Medicine

## 2018-09-01 ENCOUNTER — Emergency Department (HOSPITAL_COMMUNITY)
Admission: EM | Admit: 2018-09-01 | Discharge: 2018-09-01 | Disposition: A | Discharge status: Home / Self Care | Payer: Self-pay | Attending: Emergency Medicine | Admitting: Emergency Medicine

## 2018-09-01 ENCOUNTER — Other Ambulatory Visit: Payer: Self-pay

## 2018-09-01 DIAGNOSIS — R05 Cough: Secondary | ICD-10-CM | POA: Insufficient documentation

## 2018-09-01 DIAGNOSIS — F1210 Cannabis abuse, uncomplicated: Secondary | ICD-10-CM | POA: Insufficient documentation

## 2018-09-01 DIAGNOSIS — J45909 Unspecified asthma, uncomplicated: Secondary | ICD-10-CM | POA: Insufficient documentation

## 2018-09-01 DIAGNOSIS — R0602 Shortness of breath: Secondary | ICD-10-CM | POA: Insufficient documentation

## 2018-09-01 DIAGNOSIS — Z87891 Personal history of nicotine dependence: Secondary | ICD-10-CM | POA: Insufficient documentation

## 2018-09-01 DIAGNOSIS — R0789 Other chest pain: Secondary | ICD-10-CM | POA: Insufficient documentation

## 2018-09-01 MED ORDER — ALBUTEROL SULFATE HFA 108 (90 BASE) MCG/ACT IN AERS
4.0000 | INHALATION_SPRAY | Freq: Once | RESPIRATORY_TRACT | Status: AC
  Administered 2018-09-01: 4 via RESPIRATORY_TRACT
  Filled 2018-09-01: qty 6.7

## 2018-09-01 MED ORDER — SODIUM CHLORIDE 0.9% FLUSH: 3.0000 mL | Freq: Once | INTRAVENOUS | Status: DC

## 2018-09-01 NOTE — ED Provider Notes (Signed)
[facility]-EMERGENCY DEPT Provider Note  CSN: [id number] Arrival date & time: 09/01/18 0244  Chief Complaint(s) Shortness of Breath and Chest Pain  HPI Warren Mcbride is a 23 y.o. male   The history is provided by the patient.  Shortness of Breath Severity:  Moderate Onset quality:  Gradual Duration:  8 hours Timing:  Constant Progression:  Improving Chronicity:  Recurrent Context comment:  Started 1 hr after running Relieved by:  Rest Worsened by:  Coughing Associated symptoms: chest pain (tightness) and cough (dry)   Chest pain:    Quality: tightness     Severity:  Mild   Onset quality:  Gradual   Duration:  8 hours   Timing:  Constant   Progression:  Improving   Chronicity:  Recurrent Chest Pain Associated symptoms: cough (dry) and shortness of breath    Similar to prior asthma. Ran out of nebulizer.   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 Authorizing Provider  albuterol (PROVENTIL) (2.5 MG/3ML) 0.083% nebulizer solution Take 3 mLs (2.5 mg total) by nebulization  every 4 (four) hours as needed for wheezing or shortness of breath. Patient not taking: Reported on 09/01/2018 08/28/16   Geoffery Lyonselo, Douglas, MD  doxycycline (VIBRAMYCIN) 100 MG capsule Take 1 capsule (100 mg total) by mouth 2 (two) times daily. Patient not taking: Reported on 09/01/2018 01/27/18   Petrucelli, Samantha R, PA-C  ondansetron (ZOFRAN ODT) 4 MG disintegrating tablet Take 1 tablet (4 mg total) by mouth every 8 (eight) hours as needed for nausea or vomiting. Patient not taking: Reported on 09/01/2018 01/27/18   Petrucelli, Pleas KochSamantha R, PA-C  oseltamivir (TAMIFLU) 75 MG capsule Take 1 capsule (75 mg total) by mouth every 12 (twelve) hours. Patient not taking: Reported on 09/01/2018 01/21/18   Cathren LaineSteinl, Kevin, MD  potassium chloride SA (K-DUR,KLOR-CON) 20 MEQ tablet Take 1 tablet (20 mEq total) by mouth daily. Patient not taking: Reported on 09/01/2018 01/27/18   Petrucelli, Pleas KochSamantha R, PA-C  Past Surgical History Past Surgical History:  Procedure Laterality Date   NO PAST SURGERIES     Family History Family History  Problem Relation Age of Onset   Hypertension Father     Social History Social History   Tobacco Use   Smoking status: Former Smoker    Packs/day: 0.50    Years: 4.00    Pack years: 2.00    Types: Cigarettes   Smokeless tobacco: Never Used  Substance Use Topics   Alcohol use: Yes    Comment: occasional    Drug use: Yes    Frequency: 7.0 times per week    Types: Marijuana    Comment: occasionally   Allergies Patient has no known allergies.  Review of Systems Review of Systems  Respiratory: Positive for cough (dry) and shortness of breath.   Cardiovascular: Positive for chest pain (tightness).   All other systems are reviewed and are negative for acute change except as noted in the HPI  Physical Exam Vital Signs  I have reviewed  the triage vital signs BP 120/78 (BP Location: Right Arm)    Pulse 86    Temp 98.3 F (36.8 C) (Oral)    Resp 19    Ht 5\' 11"  (1.803 m)    Wt 106.6 kg    SpO2 100%    BMI 32.78 kg/m   Physical Exam Vitals signs reviewed.  Constitutional:      General: He is not in acute distress.    Appearance: He is well-developed. He is not diaphoretic.  HENT:     Head: Normocephalic and atraumatic.     Nose: Nose normal.  Eyes:     General: No scleral icterus.       Right eye: No discharge.        Left eye: No discharge.     Conjunctiva/sclera: Conjunctivae normal.     Pupils: Pupils are equal, round, and reactive to light.  Neck:     Musculoskeletal: Normal range of motion and neck supple.  Cardiovascular:     Rate and Rhythm: Normal rate and regular rhythm.     Heart sounds: No murmur. No friction rub. No gallop.   Pulmonary:     Effort: Pulmonary effort is normal. No respiratory distress.     Breath sounds: Normal breath sounds. No stridor. No rales.  Abdominal:     General: There is no distension.     Palpations: Abdomen is soft.     Tenderness: There is no abdominal tenderness.  Musculoskeletal:        General: No tenderness.  Skin:    General: Skin is warm and dry.     Findings: No erythema or rash.  Neurological:     Mental Status: He is alert and oriented to person, place, and time.     ED Results and Treatments Labs (all labs ordered are listed, but only abnormal results are displayed) Labs Reviewed - No data to display  EKG  EKG Interpretation  Date/Time:  Friday September 01 2018 03:27:43 EDT Ventricular Rate:  69 PR Interval:    QRS Duration: 97 QT Interval:  370 QTC Calculation: 397 R Axis:   84 Text Interpretation:  Sinus rhythm Prolonged PR interval ST elev, probable normal early repol pattern No significant change since last tracing Confirmed by  Drema Pryardama, Arisha Gervais [phone]([id number]) on 09/01/2018 3:49:50 AM      Radiology Dg Chest 2 View  Result Date: 09/01/2018 CLINICAL DATA:  Chest pain and shortness of breath. EXAM: CHEST - 2 VIEW COMPARISON:  01/27/2018 FINDINGS: The cardiomediastinal contours are normal. The lungs are clear. Pulmonary vasculature is normal. No consolidation, pleural effusion, or pneumothorax. No acute osseous abnormalities are seen. IMPRESSION: Normal radiographs of the chest. Electronically Signed   By: Narda RutherfordMelanie  Sanford M.D.   On: 09/01/2018 03:53    Pertinent labs & imaging results that were available during my care of the patient were reviewed by me and considered in my medical decision making (see chart for details).  Medications Ordered in ED Medications  sodium chloride flush (NS) 0.9 % injection 3 mL (has no administration in time range)  albuterol (VENTOLIN HFA) 108 (90 Base) MCG/ACT inhaler 4 puff (4 puffs Inhalation Given 09/01/18 0457)                                                                                                                                    Procedures Procedures  (including critical care time)  Medical Decision Making / ED Course I have reviewed the nursing notes for this encounter and the patient's prior records (if available in EHR or on provided paperwork).   Warren DoomDashawn Newstrom was evaluated in Emergency Department on 09/01/2018 for the symptoms described in the history of present illness. He was evaluated in the context of the global COVID-19 pandemic, which necessitated consideration that the patient might be at risk for infection with the SARS-CoV-2 virus that causes COVID-19. Institutional protocols and algorithms that pertain to the evaluation of patients at risk for COVID-19 are in a state of rapid change based on information released by regulatory bodies including the CDC and federal and state organizations. These policies and algorithms were followed during the patient's care in the  ED.  Post exercise dyspnea. No respiratory distress. No wheezing on exam. cxr clear.   Albuterol inhaler resolved dyspnea.  The patient appears reasonably screened and/or stabilized for discharge and I doubt any other medical condition or other [facility] requiring further screening, evaluation, or treatment in the ED at this time prior to discharge.  The patient is safe for discharge with strict return precautions.       Final Clinical Impression(s) / ED Diagnoses Final diagnoses:  Shortness of breath     The patient appears reasonably screened and/or stabilized for discharge and I doubt any other medical condition or other [facility] requiring further screening, evaluation, or treatment in the ED  at this time prior to discharge.  Disposition: Discharge  Condition: Good  I have discussed the results, Dx and Tx plan with the patient who expressed understanding and agree(s) with the plan. Discharge instructions discussed at great length. The patient was given strict return precautions who verbalized understanding of the instructions. No further questions at time of discharge.    ED Discharge Orders    None        Follow Up: Primary care provider  Schedule an appointment as soon as possible for a visit       This chart was dictated using voice recognition software.  Despite best efforts to proofread,  errors can occur which can change the documentation meaning.   Nira Connardama, Estanislado Surgeon Eduardo, MD 09/01/18 [phone]

## 2018-09-01 NOTE — ED Triage Notes (Signed)
Patient from home states he worked out today as he normally does and started experiencing shortness of breath about an hour later. Pt tried sleeping but continued to have SOB and chest pain. Patient reports hx of asthma but no longer has medicine for his nebulizer. Pt denies being around anyone sick.

## 2021-09-11 DEATH — deceased
